# Patient Record
Sex: Female | Born: 1945 | Race: Black or African American | Hispanic: No | Marital: Single | Smoking: Former smoker
Health system: Southern US, Community
[De-identification: ages and names within clinical notes are randomized; demographics above are authoritative.]

## PROBLEM LIST (undated history)

## (undated) DIAGNOSIS — F32A Depression, unspecified: Secondary | ICD-10-CM

## (undated) DIAGNOSIS — I499 Cardiac arrhythmia, unspecified: Secondary | ICD-10-CM

## (undated) DIAGNOSIS — F329 Major depressive disorder, single episode, unspecified: Secondary | ICD-10-CM

## (undated) DIAGNOSIS — D759 Disease of blood and blood-forming organs, unspecified: Secondary | ICD-10-CM

## (undated) DIAGNOSIS — I509 Heart failure, unspecified: Secondary | ICD-10-CM

## (undated) DIAGNOSIS — K219 Gastro-esophageal reflux disease without esophagitis: Secondary | ICD-10-CM

## (undated) DIAGNOSIS — G8929 Other chronic pain: Secondary | ICD-10-CM

## (undated) DIAGNOSIS — R011 Cardiac murmur, unspecified: Secondary | ICD-10-CM

## (undated) DIAGNOSIS — M549 Dorsalgia, unspecified: Secondary | ICD-10-CM

## (undated) DIAGNOSIS — M109 Gout, unspecified: Secondary | ICD-10-CM

## (undated) DIAGNOSIS — I10 Essential (primary) hypertension: Secondary | ICD-10-CM

## (undated) DIAGNOSIS — B029 Zoster without complications: Secondary | ICD-10-CM

## (undated) DIAGNOSIS — N189 Chronic kidney disease, unspecified: Secondary | ICD-10-CM

## (undated) DIAGNOSIS — A159 Respiratory tuberculosis unspecified: Secondary | ICD-10-CM

## (undated) DIAGNOSIS — I2699 Other pulmonary embolism without acute cor pulmonale: Secondary | ICD-10-CM

## (undated) DIAGNOSIS — J189 Pneumonia, unspecified organism: Secondary | ICD-10-CM

## (undated) DIAGNOSIS — N183 Chronic kidney disease, stage 3 unspecified: Secondary | ICD-10-CM

## (undated) DIAGNOSIS — F419 Anxiety disorder, unspecified: Secondary | ICD-10-CM

## (undated) DIAGNOSIS — D649 Anemia, unspecified: Secondary | ICD-10-CM

## (undated) DIAGNOSIS — Z5189 Encounter for other specified aftercare: Secondary | ICD-10-CM

## (undated) DIAGNOSIS — M5430 Sciatica, unspecified side: Secondary | ICD-10-CM

## (undated) DIAGNOSIS — F99 Mental disorder, not otherwise specified: Secondary | ICD-10-CM

## (undated) DIAGNOSIS — R0602 Shortness of breath: Secondary | ICD-10-CM

## (undated) DIAGNOSIS — M1990 Unspecified osteoarthritis, unspecified site: Secondary | ICD-10-CM

## (undated) HISTORY — PX: LASER ABLATION: SHX1947

## (undated) HISTORY — PX: VASCULAR SURGERY: SHX849

## (undated) HISTORY — PX: DILATION AND CURETTAGE OF UTERUS: SHX78

## (undated) HISTORY — PX: TUBAL LIGATION: SHX77

## (undated) HISTORY — PX: JOINT REPLACEMENT: SHX530

## (undated) HISTORY — PX: SHOULDER SURGERY: SHX246

---

## 2006-10-11 ENCOUNTER — Emergency Department (HOSPITAL_COMMUNITY): Admission: EM | Admit: 2006-10-11 | Discharge: 2006-10-11 | Payer: Self-pay | Admitting: Emergency Medicine

## 2008-01-21 ENCOUNTER — Other Ambulatory Visit: Admission: RE | Admit: 2008-01-21 | Discharge: 2008-01-21 | Payer: Self-pay | Admitting: Internal Medicine

## 2008-01-21 ENCOUNTER — Encounter: Admission: RE | Admit: 2008-01-21 | Discharge: 2008-01-21 | Payer: Self-pay | Admitting: Internal Medicine

## 2008-01-21 ENCOUNTER — Inpatient Hospital Stay (HOSPITAL_COMMUNITY): Admission: EM | Admit: 2008-01-21 | Discharge: 2008-01-24 | Payer: Self-pay | Admitting: Internal Medicine

## 2008-01-22 ENCOUNTER — Ambulatory Visit: Payer: Self-pay | Admitting: *Deleted

## 2008-01-22 ENCOUNTER — Encounter (INDEPENDENT_AMBULATORY_CARE_PROVIDER_SITE_OTHER): Payer: Self-pay | Admitting: Internal Medicine

## 2008-06-03 ENCOUNTER — Ambulatory Visit (HOSPITAL_COMMUNITY): Admission: RE | Admit: 2008-06-03 | Discharge: 2008-06-03 | Payer: Self-pay | Admitting: Cardiology

## 2008-06-03 ENCOUNTER — Encounter (INDEPENDENT_AMBULATORY_CARE_PROVIDER_SITE_OTHER): Payer: Self-pay | Admitting: Cardiology

## 2008-07-20 ENCOUNTER — Inpatient Hospital Stay (HOSPITAL_COMMUNITY): Admission: AD | Admit: 2008-07-20 | Discharge: 2008-07-22 | Payer: Self-pay | Admitting: Cardiology

## 2008-07-27 ENCOUNTER — Inpatient Hospital Stay (HOSPITAL_COMMUNITY): Admission: AD | Admit: 2008-07-27 | Discharge: 2008-08-04 | Payer: Self-pay | Admitting: Cardiology

## 2008-07-27 ENCOUNTER — Ambulatory Visit: Payer: Self-pay | Admitting: Internal Medicine

## 2008-08-15 ENCOUNTER — Encounter (INDEPENDENT_AMBULATORY_CARE_PROVIDER_SITE_OTHER): Payer: Self-pay | Admitting: Emergency Medicine

## 2008-08-15 ENCOUNTER — Ambulatory Visit: Payer: Self-pay | Admitting: Vascular Surgery

## 2008-08-15 ENCOUNTER — Inpatient Hospital Stay (HOSPITAL_COMMUNITY): Admission: EM | Admit: 2008-08-15 | Discharge: 2008-08-17 | Payer: Self-pay | Admitting: Emergency Medicine

## 2008-09-29 ENCOUNTER — Emergency Department (HOSPITAL_COMMUNITY): Admission: EM | Admit: 2008-09-29 | Discharge: 2008-09-29 | Payer: Self-pay | Admitting: Emergency Medicine

## 2008-12-07 ENCOUNTER — Inpatient Hospital Stay (HOSPITAL_COMMUNITY): Admission: RE | Admit: 2008-12-07 | Discharge: 2008-12-10 | Payer: Self-pay | Admitting: Orthopedic Surgery

## 2009-03-15 ENCOUNTER — Emergency Department (HOSPITAL_COMMUNITY): Admission: EM | Admit: 2009-03-15 | Discharge: 2009-03-16 | Payer: Self-pay | Admitting: Emergency Medicine

## 2009-03-15 ENCOUNTER — Emergency Department (HOSPITAL_COMMUNITY): Admission: EM | Admit: 2009-03-15 | Discharge: 2009-03-15 | Payer: Self-pay | Admitting: Emergency Medicine

## 2009-04-05 ENCOUNTER — Ambulatory Visit (HOSPITAL_COMMUNITY): Admission: RE | Admit: 2009-04-05 | Discharge: 2009-04-05 | Payer: Self-pay | Admitting: Orthopedic Surgery

## 2009-05-04 ENCOUNTER — Inpatient Hospital Stay (HOSPITAL_COMMUNITY)
Admission: RE | Admit: 2009-05-04 | Discharge: 2009-05-08 | Payer: Self-pay | Source: Home / Self Care | Admitting: Orthopedic Surgery

## 2009-11-18 ENCOUNTER — Other Ambulatory Visit: Admission: RE | Admit: 2009-11-18 | Discharge: 2009-11-18 | Payer: Self-pay | Admitting: Obstetrics and Gynecology

## 2009-12-22 ENCOUNTER — Emergency Department (HOSPITAL_COMMUNITY): Admission: EM | Admit: 2009-12-22 | Discharge: 2009-12-22 | Payer: Self-pay | Admitting: Emergency Medicine

## 2010-01-26 ENCOUNTER — Emergency Department (HOSPITAL_COMMUNITY): Admission: EM | Admit: 2010-01-26 | Discharge: 2010-01-26 | Payer: Self-pay | Admitting: Emergency Medicine

## 2010-01-27 ENCOUNTER — Ambulatory Visit: Payer: Self-pay | Admitting: Vascular Surgery

## 2010-01-27 ENCOUNTER — Emergency Department (HOSPITAL_COMMUNITY): Admission: EM | Admit: 2010-01-27 | Discharge: 2010-01-27 | Payer: Self-pay | Admitting: Emergency Medicine

## 2010-01-28 ENCOUNTER — Inpatient Hospital Stay (HOSPITAL_COMMUNITY): Admission: EM | Admit: 2010-01-28 | Discharge: 2010-02-04 | Payer: Self-pay | Admitting: Emergency Medicine

## 2010-02-02 ENCOUNTER — Ambulatory Visit: Payer: Self-pay | Admitting: Infectious Disease

## 2010-03-07 ENCOUNTER — Telehealth: Payer: Self-pay | Admitting: Infectious Disease

## 2010-03-07 ENCOUNTER — Ambulatory Visit: Payer: Self-pay | Admitting: Infectious Disease

## 2010-03-07 ENCOUNTER — Encounter: Payer: Self-pay | Admitting: Infectious Disease

## 2010-03-07 ENCOUNTER — Telehealth (INDEPENDENT_AMBULATORY_CARE_PROVIDER_SITE_OTHER): Payer: Self-pay | Admitting: Licensed Clinical Social Worker

## 2010-03-07 DIAGNOSIS — A4101 Sepsis due to Methicillin susceptible Staphylococcus aureus: Secondary | ICD-10-CM

## 2010-03-07 DIAGNOSIS — M009 Pyogenic arthritis, unspecified: Secondary | ICD-10-CM | POA: Insufficient documentation

## 2010-03-07 DIAGNOSIS — B028 Zoster with other complications: Secondary | ICD-10-CM | POA: Insufficient documentation

## 2010-03-07 LAB — CONVERTED CEMR LAB
BUN: 34 mg/dL — ABNORMAL HIGH (ref 6–23)
Basophils Absolute: 0.1 10*3/uL (ref 0.0–0.1)
Basophils Relative: 1 % (ref 0–1)
CO2: 24 meq/L (ref 19–32)
Calcium: 9.1 mg/dL (ref 8.4–10.5)
Chloride: 98 meq/L (ref 96–112)
Creatinine, Ser: 1.46 mg/dL — ABNORMAL HIGH (ref 0.40–1.20)
Glucose, Bld: 194 mg/dL — ABNORMAL HIGH (ref 70–99)
Lymphocytes Relative: 37 % (ref 12–46)
MCHC: 31 g/dL (ref 30.0–36.0)
Neutro Abs: 3.5 10*3/uL (ref 1.7–7.7)
Neutrophils Relative %: 52 % (ref 43–77)
Platelets: 577 10*3/uL — ABNORMAL HIGH (ref 150–400)
RDW: 16.1 % — ABNORMAL HIGH (ref 11.5–15.5)
Sed Rate: 119 mm/hr — ABNORMAL HIGH (ref 0–22)

## 2010-03-09 ENCOUNTER — Encounter: Payer: Self-pay | Admitting: Infectious Disease

## 2010-03-14 ENCOUNTER — Encounter: Payer: Self-pay | Admitting: Infectious Disease

## 2010-03-15 ENCOUNTER — Encounter: Payer: Self-pay | Admitting: Infectious Disease

## 2010-03-17 ENCOUNTER — Encounter: Admission: RE | Admit: 2010-03-17 | Payer: Self-pay | Source: Home / Self Care | Admitting: Infectious Disease

## 2010-03-19 ENCOUNTER — Inpatient Hospital Stay (HOSPITAL_COMMUNITY)
Admission: EM | Admit: 2010-03-19 | Discharge: 2010-03-20 | Payer: Self-pay | Source: Home / Self Care | Attending: Internal Medicine | Admitting: Internal Medicine

## 2010-03-19 ENCOUNTER — Encounter
Admission: RE | Admit: 2010-03-19 | Discharge: 2010-03-19 | Payer: Self-pay | Source: Home / Self Care | Attending: Infectious Disease | Admitting: Infectious Disease

## 2010-03-19 ENCOUNTER — Encounter: Payer: Self-pay | Admitting: Internal Medicine

## 2010-03-19 DIAGNOSIS — R42 Dizziness and giddiness: Secondary | ICD-10-CM | POA: Insufficient documentation

## 2010-03-19 DIAGNOSIS — M869 Osteomyelitis, unspecified: Secondary | ICD-10-CM | POA: Insufficient documentation

## 2010-03-19 DIAGNOSIS — I2699 Other pulmonary embolism without acute cor pulmonale: Secondary | ICD-10-CM

## 2010-03-19 DIAGNOSIS — D649 Anemia, unspecified: Secondary | ICD-10-CM | POA: Insufficient documentation

## 2010-03-19 DIAGNOSIS — E119 Type 2 diabetes mellitus without complications: Secondary | ICD-10-CM

## 2010-03-19 DIAGNOSIS — E785 Hyperlipidemia, unspecified: Secondary | ICD-10-CM

## 2010-03-19 DIAGNOSIS — I4892 Unspecified atrial flutter: Secondary | ICD-10-CM

## 2010-03-19 DIAGNOSIS — N189 Chronic kidney disease, unspecified: Secondary | ICD-10-CM

## 2010-03-19 DIAGNOSIS — M1990 Unspecified osteoarthritis, unspecified site: Secondary | ICD-10-CM | POA: Insufficient documentation

## 2010-03-19 DIAGNOSIS — I10 Essential (primary) hypertension: Secondary | ICD-10-CM | POA: Insufficient documentation

## 2010-03-19 DIAGNOSIS — F329 Major depressive disorder, single episode, unspecified: Secondary | ICD-10-CM

## 2010-03-19 DIAGNOSIS — E1149 Type 2 diabetes mellitus with other diabetic neurological complication: Secondary | ICD-10-CM

## 2010-03-19 DIAGNOSIS — K219 Gastro-esophageal reflux disease without esophagitis: Secondary | ICD-10-CM | POA: Insufficient documentation

## 2010-03-19 HISTORY — DX: Chronic kidney disease, unspecified: N18.9

## 2010-03-30 ENCOUNTER — Encounter: Payer: Self-pay | Admitting: Infectious Disease

## 2010-04-01 ENCOUNTER — Telehealth: Payer: Self-pay | Admitting: Infectious Disease

## 2010-04-17 ENCOUNTER — Encounter: Payer: Self-pay | Admitting: Internal Medicine

## 2010-04-17 ENCOUNTER — Encounter: Payer: Self-pay | Admitting: Infectious Disease

## 2010-04-18 ENCOUNTER — Encounter: Payer: Self-pay | Admitting: Infectious Disease

## 2010-04-18 ENCOUNTER — Encounter: Payer: Self-pay | Admitting: Gastroenterology

## 2010-04-18 ENCOUNTER — Ambulatory Visit
Admission: RE | Admit: 2010-04-18 | Discharge: 2010-04-18 | Payer: Self-pay | Source: Home / Self Care | Attending: Infectious Disease | Admitting: Infectious Disease

## 2010-04-18 DIAGNOSIS — B029 Zoster without complications: Secondary | ICD-10-CM | POA: Insufficient documentation

## 2010-04-18 LAB — CONVERTED CEMR LAB
AST: 17 units/L (ref 0–37)
Albumin: 3.8 g/dL (ref 3.5–5.2)
Alkaline Phosphatase: 86 units/L (ref 39–117)
Basophils Absolute: 0 10*3/uL (ref 0.0–0.1)
Basophils Relative: 1 % (ref 0–1)
Eosinophils Absolute: 0.1 10*3/uL (ref 0.0–0.7)
Glucose, Bld: 288 mg/dL — ABNORMAL HIGH (ref 70–99)
Hemoglobin: 15.6 g/dL — ABNORMAL HIGH (ref 12.0–15.0)
MCHC: 32.2 g/dL (ref 30.0–36.0)
MCV: 92 fL (ref 78.0–100.0)
Monocytes Absolute: 0.2 10*3/uL (ref 0.1–1.0)
Monocytes Relative: 6 % (ref 3–12)
Neutro Abs: 1.9 10*3/uL (ref 1.7–7.7)
Neutrophils Relative %: 56 % (ref 43–77)
Potassium: 3.7 meq/L (ref 3.5–5.3)
RBC: 5.27 M/uL — ABNORMAL HIGH (ref 3.87–5.11)
RDW: 15.7 % — ABNORMAL HIGH (ref 11.5–15.5)
Sodium: 139 meq/L (ref 135–145)
Total Bilirubin: 0.4 mg/dL (ref 0.3–1.2)
Total Protein: 7.3 g/dL (ref 6.0–8.3)

## 2010-04-20 ENCOUNTER — Ambulatory Visit: Admit: 2010-04-20 | Payer: Self-pay | Admitting: Infectious Disease

## 2010-04-20 ENCOUNTER — Encounter: Payer: Self-pay | Admitting: Infectious Disease

## 2010-04-21 ENCOUNTER — Encounter: Payer: Self-pay | Admitting: Infectious Disease

## 2010-04-25 ENCOUNTER — Encounter: Payer: Self-pay | Admitting: Infectious Disease

## 2010-04-25 ENCOUNTER — Telehealth: Payer: Self-pay | Admitting: Infectious Disease

## 2010-04-27 ENCOUNTER — Telehealth: Payer: Self-pay | Admitting: Infectious Disease

## 2010-04-28 NOTE — Miscellaneous (Signed)
Summary: HIPAA Restrictions  HIPAA Restrictions   Imported By: Florinda Marker 03/09/2010 08:59:25  _____________________________________________________________________  External Attachment:    Type:   Image     Comment:   External Document

## 2010-04-28 NOTE — Miscellaneous (Signed)
Summary: [facility]  Clinical Lists Changes  Medications: Added new medication of LORAZEPAM 1 MG TABS (LORAZEPAM) take one half table to one whole tablet 1 hour before procedure, may repeat before procedure - Signed Rx of LORAZEPAM 1 MG TABS (LORAZEPAM) take one half table to one whole tablet 1 hour before procedure, may repeat before procedure;  #3 x 0;  Signed;  Entered by: Acey Lav MD;  Authorized by: Paulette Blanch Dam MD;  Method used: Printed then faxed to [facility]. # [id number]*, [address] [place], [place]  [address], Ph: [phone], Fax: [phone]    Prescriptions: LORAZEPAM 1 MG TABS (LORAZEPAM) take one half table to one whole tablet 1 hour before procedure, may repeat before procedure  #3 x 0   Entered and Authorized by:   Acey Lav MD   Signed by:   Paulette Blanch Dam MD on 03/15/2010   Method used:   Printed then faxed to ...       [facility] Dr[address] # [phone]* (retail)       [address]       [place], [place]  [address]       Ph: [phone]       Fax: [phone]   RxID:   [id number]

## 2010-04-28 NOTE — Discharge Summary (Signed)
Summary: Hospital Discharge Update    Hospital Discharge Update:  Date of Admission: 03/19/2010 Date of Discharge: 03/20/2010  Brief Summary:  Pt is a 65 y/o woman admitted for osteomyelitis of right shoulder. Underwent I+D on 12/24 by Dr. Ophelia Charter of Orthopedics.  To follow-up with Dr. Ophelia Charter on Dec 27 to pull her drain, her coumadin clinic in the next 4-5 days, PCP Dr. Nehemiah Settle in 1-2 weeks, and Dr. Daiva Eves in 4-5 weeks.  Discharged home on IV Ancef q8h x6 weeks.  Lab or other results pending at discharge:  wound cultures  Labs needed at follow-up: CBC with differential, PT/INR  Problem list changes:  Added new problem of DIABETES MELLITUS, TYPE II, ON INSULIN (ICD-250.00) Added new problem of INTERMITTENT VERTIGO (ICD-780.4) Added new problem of ATRIAL FLUTTER, CHRONIC (ICD-427.32) Added new problem of HYPERTENSION (ICD-401.9) Added new problem of MORBID OBESITY (ICD-278.01) Added new problem of GERD (ICD-530.81) Added new problem of DEPRESSION (ICD-311) Added new problem of HYPERLIPIDEMIA (ICD-272.4) Added new problem of OSTEOARTHRITIS (ICD-715.90) Added new problem of RENAL INSUFFICIENCY, CHRONIC (ICD-585.9) Added new problem of History of  PULMONARY EMBOLISM (ICD-415.19) Added new problem of DIABETIC PERIPHERAL NEUROPATHY (ICD-250.60) Added new problem of ANEMIA (ICD-285.9) Added new problem of Hospitalized for  OSTEOMYELITIS (ICD-730.20)  Medication list changes:  Changed medication from FUROSEMIDE 80 MG TABS (FUROSEMIDE) take 1 tablet by mouth once daily to FUROSEMIDE 80 MG TABS (FUROSEMIDE) Take 1 tablet by mouth two times a day Removed medication of FUROSEMIDE 80 MG TABS (FUROSEMIDE) take 1 tablet by mouth twice daily Changed medication from DILTIAZEM HCL 120 MG TABS (DILTIAZEM HCL) 1 tablet daily to DILTIAZEM HCL ER BEADS 120 MG XR24H-CAP (DILTIAZEM HCL ER BEADS) Take 1 tablet by mouth once a day Changed medication from METOPROLOL SUCCINATE 100 MG XR24H-TAB (METOPROLOL  SUCCINATE) 1 once daily to METOPROLOL SUCCINATE 100 MG XR24H-TAB (METOPROLOL SUCCINATE) 1/2 tab by mouth daily Removed medication of LORAZEPAM 0.5 MG TABS (LORAZEPAM) 1 to 2 tablets as needed Removed medication of HIBICLENS 4 % LIQD (CHLORHEXIDINE GLUCONATE) wash once daily for 7 days after picc line out and one week before surgery Removed medication of BACTROBAN 2 % OINT (MUPIROCIN) apply twice dialy inside nostrils for 7 days after picc out and before surgery Removed medication of VALACYCLOVIR HCL 1 GM TABS (VALACYCLOVIR HCL) Take 1 tablet by mouth two times a day for 10 days Removed medication of ACYCLOVIR 400 MG TABS (ACYCLOVIR) Take 1 tablet by mouth two times a day once done with valtrex Removed medication of LORAZEPAM 1 MG TABS (LORAZEPAM) take one half table to one whole tablet 1 hour before procedure, may repeat before procedure Added new medication of LANTUS 100 UNIT/ML SOLN (INSULIN GLARGINE) Inject 44u Subcutaneously two times a day Added new medication of OXYCODONE HCL 5 MG TABS (OXYCODONE HCL) Take 1-2 tabs by mouth every 4hours as needed for pain - Signed Added new medication of CEFAZOLIN SODIUM 1 GM SOLR (CEFAZOLIN SODIUM) Infuse 1g IV q8h for 6 weeks Rx of OXYCODONE HCL 5 MG TABS (OXYCODONE HCL) Take 1-2 tabs by mouth every 4hours as needed for pain;  #60 x 0;  Signed;  Entered by: Danelle Berry, MD;  Authorized by: Danelle Berry, MD;  Method used: Handwritten  Allergy list changes:  Added new allergy or adverse reaction of * SHELLFISH  The medication, problem, and allergy lists have been updated.  Please see the dictated discharge summary for details.  Discharge medications:  WARFARIN SODIUM 5 MG TABS (WARFARIN SODIUM) take  as directed FUROSEMIDE 80 MG TABS (FUROSEMIDE) Take 1 tablet by mouth two times a day MECLIZINE HCL 25 MG TABS (MECLIZINE HCL) 1 tablet three times daily SIMVASTATIN 40 MG TABS (SIMVASTATIN) 1 every evening POTASSIUM CHLORIDE 10 MEQ/100ML SOLN  (POTASSIUM CHLORIDE) take 2 tab by mouth daily PROMETHAZINE HCL 25 MG TABS (PROMETHAZINE HCL) take one every 4 to 6 hours as needed SERTRALINE HCL 50 MG TABS (SERTRALINE HCL) take 1 tablet by mouth every day DILTIAZEM HCL ER BEADS 120 MG XR24H-CAP (DILTIAZEM HCL ER BEADS) Take 1 tablet by mouth once a day METOPROLOL SUCCINATE 100 MG XR24H-TAB (METOPROLOL SUCCINATE) 1/2 tab by mouth daily GABAPENTIN 600 MG TABS (GABAPENTIN) take 1 tablet by mouth three times daily LANTUS 100 UNIT/ML SOLN (INSULIN GLARGINE) Inject 44u Subcutaneously two times a day OXYCODONE HCL 5 MG TABS (OXYCODONE HCL) Take 1-2 tabs by mouth every 4hours as needed for pain CEFAZOLIN SODIUM 1 GM SOLR (CEFAZOLIN SODIUM) Infuse 1g IV q8h for 6 weeks  Other patient instructions:  Please follow-up with Dr. Ophelia Charter on Tuesday, Dec 27. Please call his clinic on Monday to schedule an appointment ([phone]). Please call your primary care physician Dr. Nehemiah Settle [phone]) to follow-up in the next 1-2 weeks. Please follow-up with Dr. Daiva Eves of Infectious Disease in 4-5 weeks.  Please call the clinic at [phone] on Monday to schedule an appointment.  Take your medicines as directed. Take 7.5mg  coumadin tomorrow, then go back to 5mg  daily until you have your INR checked.  Please call your coumadin clinic/PCP to have your INR checked sometime this week. You will be on IV antibiotics for 6 weeks. Take 30 units of Lantus tonight. Check your blood sugars tomorrow and if they are running higher than usual, you may add 2 units tomorrow (Monday) night (32 units).  Continue going up on your lantus by 2 units nightly until you reach your previous home dose of 44 units if your sugars still run higher than usual (this will take several days).  Note: Hospital Discharge Medications & Other Instructions handout was printed, one copy for patient and a second copy to be placed in hospital chart.

## 2010-04-28 NOTE — Progress Notes (Signed)
Summary: Care Plan Oversight  Phone Note Outgoing Call   Call placed by: Acey Lav MD,  April 01, 2010 8:22 AM Details for Reason: Care Plan Oversight Summary of Call: [phone] (30 or more mins)  I have supervised home care and/or infusion therapy for this pt, including providing orders for care, review of labs and/or home health care plans, communicating with the home health care professionals and/or patient/caregivers to integrate current information into the medical treatment plan and/or adjust the medical therapy. This supervision has been provided for 32__minutes during the calendar month. Dates for this oversight 04/05/10 thru 2/9/12_.  Treating for infected shoulder with MSSA  Initial call taken by: Acey Lav MD,  April 01, 2010 8:23 AM

## 2010-04-28 NOTE — Progress Notes (Signed)
Summary: lab results need to be faxed  Phone Note Other Incoming   Caller: Verlon Au from [place] Summary of Call: Please fax lab results to [facility] at [phone] labs are faxed Initial call taken by: Starleen Arms CMA,  March 14, 2010 8:55 AM

## 2010-04-28 NOTE — Assessment & Plan Note (Signed)
Summary: f/u infection   Visit Type:  Follow-up Referring Provider:  Annell Greening Primary Provider:  Renford Dills  CC:  f/u.  History of Present Illness:  65 yo with complicated PMHX who was found to have MSSA septic shoulder in November , sp washout by Dr. Ophelia Charter. We placed her on ancef and she has continued on this since then. In interim she was seen  and found to have an elevated ESR and repeat MRI showed area concerning for residual abscess along with osteomyelitis of the humerus. She underwent repeat surgery by Dr. Ophelia Charter who encountered fluid along with a torn biceps tendone but no frank purulence and she was continued on ancef (though dc note mentions 1g three times a day rather than 2 g three times a day). She was seen by Dr Ophelia Charter and still had elevated ESR. I had thought that she might have RA--and that this could have caused her elevated ESR but in fact trhough more careful review of records she does not have history documented in [place] or EMR of Rheuamtoid arthritis. She states at present that her shoulder pain is much improved though she still has pain at times up to 5/10 in severity. She also stil has dermatomal zoster pain.   Current Allergies (reviewed today): ! * SHELLFISH Past History:  Past Medical History: Last updated: 03/07/2010   1. Atrial fibrillation/atrial flutter with failed ablation.   2. DM.   3. Hypertension.   4. 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   5. Right lower extremity DVT.   6. Obesity.   7. Anemia.   8. Diabetic neuropathy.   9. GERD.   10.Anxiety/depression.   11.CHF with an EF 55%.   12.Hyperlipidemia.  13 Recurrent Zoster 14. Problems with breast enlargement of unknown cause (negative mammogram)     Family History: Last updated: 03/07/2010 :  Noncontributory.   Social History: Last updated: 03/07/2010  Denies tobacco abuse, alcohol abuse, or drug abuse.   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   Family History: Reviewed history from  03/07/2010 and no changes required. :  Noncontributory.   Social History: Reviewed history from 03/07/2010 and no changes required.  Denies tobacco abuse, alcohol abuse, or drug abuse.   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   Review of Systems       see HPI otherwise negative on 12 pt review  Vital Signs:  Patient profile:   65 year old female Height:      63.5 inches (161.29 cm) Weight:      238 pounds (108.18 kg) BMI:     41.65 Temp:     98.6 degrees F (37.00 degrees C) oral Pulse rate:   85 / minute BP sitting:   129 / 79  (left arm)  Vitals Entered By: Starleen Arms CMA (April 18, 2010 10:31 AM) CC: f/u Is Patient Diabetic? Yes Did you bring your meter with you today? No Pain Assessment Patient in pain? yes     Location: rt shoulder Intensity: 7 Type: aching Nutritional Status BMI of > 30 = obese  Does patient need assistance? Ambulation Impaired:Risk for fall Comments walks with a cane   Physical Exam  General:  alert, well-nourished, well-hydrated, and overweight-appearing.   Eyes:  vision grossly intact, pupils equal, and pupils round.   Ears:   ear piercing(s) noted.  vesicle in the canal Mouth:  no dental plaque, pharynx pink and moist, and no erythema.   Abdomen:  soft, non-tender, and normal bowel  sounds.   Msk:  reduced rom in the right shoulder with reduced elevaation and abduciton of the shoulder. Surgical scar CDI Extremities:  1+ left pedal edema and 1+ right pedal edema.   Neurologic:  alert & oriented X3, strength normal in all extremities, and gait normal.   Skin:  healed rash in  the V3 dermatome Psych:  Oriented X3, memory intact for recent and remote, and normally interactive.     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 If it is still up will need repeat MRI though I also wonder if she has another site of metastatic MSSA  infection. Will probe her with questions and if this is unrevealing might consier tagged white blood cell scan in additon to MRI Her updated medication list for this problem includes:    Oxycodone Hcl 5 Mg Tabs (Oxycodone hcl) .Marland Kitchen... Take 1-2 tabs by mouth every 4hours as needed for pain    Cefazolin Sodium 1 Gm Solr (Cefazolin sodium) ..... Infuse 1g iv q8h for 6 weeks  Orders: Est. 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 Patient Level IV (02725)  Problem # 4:  OSTEOARTHRITIS (ICD-715.90) I had thought the pt had hx of RA but see no documentatio of this. Will check RF and also ask Dr. Nehemiah Settle for more records Her updated medication list for this problem includes:    Oxycodone Hcl 5 Mg Tabs (Oxycodone hcl) .Marland Kitchen... Take 1-2 tabs by mouth every 4hours as needed for pain  Orders: T-Rheumatoid Factor [facility]) [phone]) Est. Patient Level IV [id number])  Other Orders: T-C-Reactive Protein [phone]) T-CBC w/Diff [id number]) T-Comprehensive Metabolic Panel [id number]) T-Sed Rate (Automated) [phone])  Patient Instructions: 1)  we will get bloodwork today 2)  I will contact Dr. Ophelia Charter re bloodwork 3)  if you sed rate is still high you will need another open mri with [facility] 4)  We will make fu appt plans after gathering that data 5)  continue iv antibiotics in the meantime Prescriptions: OXYCODONE HCL 5 MG TABS (OXYCODONE HCL) Take 1-2 tabs by mouth every 4hours as needed for pain  #60 x 0   Entered and Authorized by:   Acey Lav MD   Signed by:   Paulette Blanch Dam MD on 04/18/2010   Method used:   Print then Give to Patient   RxID:   [id number]

## 2010-04-28 NOTE — Progress Notes (Addendum)
Summary: Care Plan Oversight  Phone Note Outgoing Call   Call placed by: Acey Lav MD,  March 07, 2010 5:39 PM Details for Reason: Care Plan Oversight Summary of Call: [phone] (30 or more mins)  I have supervised home care and/or infusion therapy for this pt, including providing orders for care, review of labs and/or home health care plans, communicating with the home health care professionals and/or patient/caregivers to integrate current information into the medical treatment plan and/or adjust the medical therapy. This supervision has been provided for _32__minutes during the calendar month. Dates for this oversight 02/03/10 thru 03/04/10.  Treating MSSA septic arthritis   Initial call taken by: Acey Lav MD,  March 07, 2010 5:40 PM

## 2010-04-28 NOTE — Assessment & Plan Note (Signed)
Summary: hsfu need chart/mssa septic sholder/   Visit Type:  Follow-up Referring Provider:  Annell Greening Primary Provider:  Renford Dills  CC:  hsfu and septic shoulder.  History of Present Illness:  65 yo with complicated PMHX who was found to have MSSA septic shoulder in November , sp washout by Dr. Ophelia Charter. We placed her on ancef and she has continued on this since then. She has marked improvement in her shoulder pain though she still does have some pain there and apparently is in need of improved pain control (and will be calling Dr. Nehemiah Settle re this). She denies fevers, chills, nausea. She has had yet another outbreak of apparent zoster with painful sensation in ear on the right and V3 dermatomal rash with vesicles within the past 3 days She noticed furuncles under right axilla prior to development of septic shoulder. I spent over 45 minutes with this pt including greater than 50% of time in face to face counsellng of the pt and coordination of care.      Current Allergies (reviewed today): No known allergies  Past History:  Past Medical History:   1. Atrial fibrillation/atrial flutter with failed ablation.   2. DM.   3. Hypertension.   4. 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   5. Right lower extremity DVT.   6. Obesity.   7. Anemia.   8. Diabetic neuropathy.   9. GERD.   10.Anxiety/depression.   11.CHF with an EF 55%.   12.Hyperlipidemia.  13 Recurrent Zoster 14. Problems with breast enlargement of unknown cause (negative mammogram)     Family History: :  Noncontributory.   Social History:  Denies tobacco abuse, alcohol abuse, or drug abuse.   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   Review of Systems       The patient complains of suspicious skin lesions and breast masses.  The patient denies anorexia, fever, weight loss, weight gain, vision loss, decreased hearing, hoarseness, chest pain, syncope, dyspnea on exertion, peripheral edema, prolonged cough, headaches,  hemoptysis, abdominal pain, melena, hematochezia, severe indigestion/heartburn, hematuria, incontinence, genital sores, muscle weakness, transient blindness, difficulty walking, depression, unusual weight change, abnormal bleeding, and enlarged lymph nodes.    Vital Signs:  Patient profile:   65 year old female Height:      63.5 inches (161.29 cm) Weight:      238 pounds (108.18 kg) BMI:     41.65 Temp:     98.2 degrees F (36.78 degrees C) oral Pulse rate:   83 / minute BP sitting:   141 / 103  (left arm)  Vitals Entered By: Starleen Arms CMA (March 07, 2010 9:09 AM) CC: hsfu, septic shoulder Is Patient Diabetic? Yes Did you bring your meter with you today? No Pain Assessment Patient in pain? yes     Location: shoulder Intensity: 7 Type: aching Nutritional Status BMI of > 30 = obese Nutritional Status Detail nl  Does patient need assistance? Functional Status Self care Ambulation Normal   Physical Exam  General:  alert, well-nourished, well-hydrated, and overweight-appearing.   Head:  normocephalic, atraumatic, and no abnormalities observed.   Eyes:  vision grossly intact, pupils equal, and pupils round.   Ears:   ear piercing(s) noted.  vesicle in the canal Nose:  no external deformity and no external erythema.   Mouth:  no dental plaque, pharynx pink and moist, and no erythema.   Neck:  supple and full ROM.   Lungs:  normal respiratory effort, no crackles, and no  wheezes.   Heart:  normal rate, regular rhythm, no murmur, no gallop, and no rub.   Abdomen:  soft, non-tender, and normal bowel sounds.   Msk:  reduced rom in the right shoulder with reduced elevaation and abduciton of the shoulder Extremities:  1+ left pedal edema and 1+ right pedal edema.   Neurologic:  alert & oriented X3, strength normal in all extremities, and gait normal.   Skin:  vesicular rashin the V3 dermatome Psych:  Oriented X3, memory intact for recent and remote, and normally  interactive.     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 Patient Level V (83151)  Problem # 3:  OTH PLASTIC SURGERY UNACCEPTABLE COSMETIC APPEAR (ICD-V50.1)  recommend postponing surgery till infection cleared. WIll try decontamintion regimen and would repeat prior to surgeyr to reduce risk of MSSA (and MRSA infection  Orders: Est. Patient Level V (76160)  Problem # 4:  OTITIS EXTERNA DUE TO HERPES ZOSTER (ICD-053.71)  give her valtrex for 10 days and then suppressive acyclovir. Her HIV elisa was negative  Orders: Est. Patient Level V (73710)  Medications Added to Medication List This Visit: 1)  Warfarin Sodium 5 Mg Tabs (Warfarin sodium) .... Take as directed 2)  Furosemide 80 Mg Tabs (Furosemide) .... Take 1 tablet by mouth once daily 3)  Meclizine Hcl 25 Mg Tabs (Meclizine hcl) .Marland Kitchen.. 1 tablet three times daily 4)  Simvastatin 40 Mg Tabs (Simvastatin) .Marland Kitchen.. 1 every evening 5)  Potassium Chloride 10 Meq/111ml Soln (Potassium chloride) .... Take 2 tab by mouth daily 6)  Furosemide 80 Mg Tabs (Furosemide) .... Take 1 tablet by mouth twice daily 7)  Promethazine Hcl 25 Mg Tabs (Promethazine hcl) .... Take one every 4 to 6 hours as needed 8)  Sertraline Hcl 50 Mg Tabs (Sertraline hcl) .... Take 1 tablet by mouth every day 9)  Diltiazem Hcl 120 Mg Tabs (Diltiazem hcl) .Marland Kitchen.. 1 tablet daily 10)  Metoprolol Succinate 100 Mg Xr24h-tab (Metoprolol succinate) .Marland Kitchen.. 1 once daily 11)  Gabapentin 600  Mg Tabs (Gabapentin) .... Take 1 tablet by mouth three times daily 12)  Lorazepam 0.5 Mg Tabs (Lorazepam) .Marland Kitchen.. 1 to 2 tablets as needed 13)  Hibiclens 4 % Liqd (Chlorhexidine gluconate) .... Wash once daily for 7 days after picc line out and one week before surgery 14)  Bactroban 2 % Oint (Mupirocin) .... Apply twice dialy inside nostrils for 7 days after picc out and before surgery 15)  Valacyclovir Hcl 1 Gm Tabs (Valacyclovir hcl) .... Take 1 tablet by mouth two times a day for 10 days 16)  Acyclovir 400 Mg Tabs (Acyclovir) .... Take 1 tablet by mouth two times a day once done with valtrex  Patient Instructions: 1)  we will have one of our rns examine your picc line 2)  we will get blood work today 3)  if this looks encouraging will finish antibiotic sin a week and pull picc 4)  then do decontamination regimen with hibiclenz and mupirocin for a week 5)  repeat this week before any surgery 6)  start valtrex today  and go for 10 days 7)  then start acyclovir 8)  make sure you drink plenty of fluids while on thes medicines 9)  rtc with Dr. Daiva Eves in 3 months Prescriptions: ACYCLOVIR 400 MG TABS (ACYCLOVIR) Take 1 tablet by mouth two times a day once done with valtrex  #60 x 11   Entered and Authorized by:   Acey Lav MD   Signed by:   Paulette Blanch Dam MD on 03/07/2010   Method used:   Electronically to        [facility] [address] # [phone]* (retail)       [address]       [place], [place]  [address]       Ph: [phone]       Fax: [phone]   RxID:   [id number] VALACYCLOVIR HCL 1 GM TABS (VALACYCLOVIR HCL) Take 1 tablet by mouth two times a day for 10 days  #20 x 3   Entered and Authorized by:   Acey Lav MD   Signed by:   Paulette Blanch Dam MD on 03/07/2010   Method used:   Electronically to        [facility] Dr[address] # [phone]* (retail)       [address]       [place], [place]  [address]       Ph: [phone]       Fax: [phone]   RxID:    [id number] BACTROBAN 2 % OINT (MUPIROCIN) apply twice dialy inside nostrils for 7 days after picc out and before surgery  #30 x 2   Entered and Authorized by:   Acey Lav MD   Signed by:   Paulette Blanch Dam MD on 03/07/2010   Method used:   Electronically to        [facility] Dr[address] # [phone]* (retail)       [address]       [place], [place]  [address]       Ph: [phone]       Fax: [phone]   RxID:   [id number] HIBICLENS 4 % LIQD (CHLORHEXIDINE GLUCONATE) wash once daily for 7 days after picc line out and one week before surgery  #1 x 2   Entered and Authorized by:   Acey Lav MD   Signed by:   Paulette Blanch Dam MD on 03/07/2010   Method used:   Electronically to        [facility] Dr[address] # [phone]* (retail)       [address]       [place], [place]  [address]       Ph: [phone]       Fax: [phone]   RxID:   [id number]  Order recieved to blood draw and PIC line dressing change from Dr. Daiva Eves.  Completed blood drase and sterile dressing change.  Pt. without complaint.  Jennet Maduro RN  March 07, 2010 10:51 AM

## 2010-04-28 NOTE — Miscellaneous (Signed)
Summary: Orders Update  Clinical Lists Changes  Orders: Added new Test order of CT with Contrast (CT w/ contrast) - Signed 

## 2010-04-28 NOTE — Initial Assessments (Signed)
INTERNAL MEDICINE ADMISSION HISTORY AND PHYSICAL  PCP: Deirdre Peer. Polite, M.D. of [place]. ID - Dr. 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 Ophelia Charter (orthopedics) and on chronic Ancef and followed by Dr. Daiva Eves who presents after having MRI of the right shoulder today which showed osteomyelitis.  Pt complains of occasional sharp stabbing pain in her right shoulder for several weeks and severe tenderness to palpation of the shoulder.  She states that she has been increasingly weak from lack of use on that side and for several weeks has been virtually unable to use that arm because of the pain.    Patient also complains of vaginal itching and dysuria for the past several days; she states she has had several yeast infections since being on Ancef.  Denies any urinary urgency and frequency. Denies any constipation/diarrhea. No fevers, weight loss, CP, SOB, cough, abdominal pain. No headaches. Endorses occasional dizziness which is chronic.  No numbness or tingling.   ALLERGIES: Shellfish - emesis, diarrhea and sharp abdominal pain  PAST MEDICAL HISTORY: h/o right shoulder septic arthritis with MSSA, diagnosed in Nov 2011. s/p washout Jan 30, 2010 by Dr. Ophelia Charter, s/p 5 weeks IV Ancef Atrial fibrillation/atrial flutter status post EP study with paced termination of atrial flutter with failed ablation.  Chronic renal insufficiency - baseline Cr 1.6 DM.  Hypertension.  h/o PE x2, h/o DVT - on chronic Coumadin.  h/o chronic pain, noncardiac dependent, for prolonged period of time Obesity.  Anemia.  Diabetic neuropathy.  GERD.  Anxiety/depression.  CHF with an EF 55%.  Hyperlipidemia.  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 Problems with breast enlargement of unknown cause (negative mammogram) vertigo    MEDICATIONS:    lantus 44u Subcutaneously two times a day coumadin as directed lasix 80mg  by mouth two times a day meclizine 25mg  by mouth three times a day as needed for dizziness/vertigo metoprolol ER 50mg  by mouth daily KCl by mouth daily neurontin 600mg  by mouth three times a day promethazine 25mg  by mouth three times a day as needed nausea diltiazem ER 120mg  by mouth daily sertraline 50mg  by mouth daily simvastatin 40mg  by mouth daily   SOCIAL HISTORY: Denies tobacco abuse, alcohol abuse, or drug abuse. 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    FAMILY HISTORY Noncontributory.    ROS: As per HPI  VITALS: T: 98 P:95  BP: 111/80 R: 16  O2SAT:96%  ON: RA  PHYSICAL EXAM: Gen: Patient is an obese woman in NAD Eyes: PERRL, EOMI ENT: MMM Neck: Supple Resp: CTA- Bilaterally, No W/C/R. CVS: RRR, No M/R/G GI: Abdomen is soft. ND, NT, BS+.   Ext: trace pedal edema, 2+ DP pulses MSK:  limited ROM of right shoulder - pt unable to internally or externally rotate shoulder. Right shoulder tender to palpation laterally and tender to both passive and active movement.  Joint not significantly warmer on right as left. No erythema. Skin intact. Neuro: A&O X3, CN II - XII are grossly intact. Motor strength is 5-/5 in the all 4 extremities, Sensations intact to light touch Psych: Appropriate   LABS: All labs are pending  IMAGING: MRI RIGHT SHOULDER WITH AND WITHOUT CONTRAST    Technique:  Multiplanar, multisequence MR imaging of the right   shoulder was performed before and after the administration of  intravenous contrast.    Contrast: 20 ml Multihance    Comparison:  CT scan 01/27/2010    Findings: There is severe septic arthritis involving the   glenohumeral joint along with osteomyelitis involving the humeral   head and glenoid.  There is also involvement of the rotator cuff   tendons and subacromial/subdeltoid bursa.  There is a large 4 x 3   cm bursal abscess.  There is also  involvement of the rotator cuff   muscles and deltoid muscles.    IMPRESSION:    1.  MR findings consistent with septic arthritis, osteomyelitis,   myofasciitis and also involvement of the rotator cuff tendons and   biceps tendon.   2.  4 x 3 cm bursal abscess.  ASSESSMENT AND PLAN:1. 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 Dr. Ophelia Charter to perform arthroscopic and irrigation and drainage todayDr. Daiva Eves advises restarting ancef after the surgeryMorphine for pain controlPre-op care- PT/PTT, EKG, NPO2. A-fibContinue diltiazem and metoprolol at home dose after the surgery3. DepressionContinue sertraline4. CHFHer last echo in 3/10 was in 2010 showed an EF of 60% with normal systolic function. She is on lasix 80 po bid and metoprolol - holding lasix for now.5. H/o DVT/PE- will hold coumadin for now, restart after surgery6. DM- check HbA1C- SSI moderate and lantus 20 units7. DVT Px- SCD 8. Vaginal itching/dysuria Likely 2/2 vaginal candidiasis, will treat emprically with diflucan 9. Dispo: d/c after surgery per Dr. Ophelia Charter.Will ask PT to evaluate prior to dischargeWill arrange for outpatient follow up with ID and Ortho

## 2010-04-28 NOTE — Progress Notes (Addendum)
Summary: Care Plan Oversight  Phone Note Outgoing Call   Call placed by: Acey Lav MD,  March 07, 2010 5:41 PM Details for Reason: Care Plan Oversight December Summary of Call: [phone] (30 or more mins)  I have supervised home care and/or infusion therapy for this pt, including providing orders for care, review of labs and/or home health care plans, communicating with the home health care professionals and/or patient/caregivers to integrate current information into the medical treatment plan and/or adjust the medical therapy. This supervision has been provided for _32_minutes during the calendar month. Dates for this oversight 03/05/10 thru 11/09/12_.  MSSA septic shoulder   Initial call taken by: Acey Lav MD,  March 07, 2010 5:41 PM

## 2010-04-28 NOTE — Miscellaneous (Signed)
Summary: [facility]: CBCw/Diff  [facility]: CBCw/Diff   Imported By: Florinda Marker 04/11/2010 12:02:05  _____________________________________________________________________  External Attachment:    Type:   Image     Comment:   External Document

## 2010-05-02 ENCOUNTER — Encounter: Payer: Self-pay | Admitting: Infectious Disease

## 2010-05-04 NOTE — Progress Notes (Signed)
Summary: Pt. was receiving correct dose of Cefazolin, 2 grams  Phone Note Other Incoming   CallerMarcelino Duster @ [place], [phone], ext [phone] Summary of Call: Message left.  Marcelino Duster called the [facility] to find out what dose of Cefazolin the pt. was receiving.  The pharmacy told her that the pt. WAS receiving 2 grams.  The pharmacy statedt that they had clarified the dose when the pt. was admitted to the [facility] service.  [facility] wants to know whether Dr. Daiva Eves still wants to continue the medication knowing that the dose was correct.   Please advise. Jennet Maduro RN  April 27, 2010 10:01 AM   Follow-up for Phone Call        2 grams three times a day is perfect Follow-up by: Acey Lav MD,  April 27, 2010 11:48 AM     Appended Document: Pt. was receiving correct dose of Cefazolin, 2 grams RN spoke with Marcelino Duster at [place].  Confirmed the dose and length of treatment for Cefazolin 2 grams.

## 2010-05-04 NOTE — Progress Notes (Signed)
Summary: Sed rate values and antibiotic dose needs confirmed  Phone Note From Pharmacy   Caller: [facility] Summary of Call: Wanting to know how [place] the pt. is going to remain on the IV antibiotic.  Will fax lab work from [place].  [facility] sed rates are elevated above the sed rate which was drawn at the Center.   Rn asked the pharmacy to check with [facility] about the sed rates that were previously drawn.  [facility] lab reports to be faxed to the Center.  Also, need to check on the dose of  antibiotic.  There is a discrepancy between 1 gram vs 2 grams every 8 hours.   Please advise. Jennet Maduro RN  April 25, 2010 3:33 PM   Follow-up for Phone Call        Please have them increase the ancef to 2 grams iv three times a day. that is ithe dose I wanted. She should continue antibiotics until February 25th. I can work her in to [facility] again here. Have they gotten ESR after ours? and is [facility]? OUrs was normal? We can recheck it in our clinic, I will try to work her in before the end of the month Follow-up by: Acey Lav MD,  April 26, 2010 8:25 AM  Additional Follow-up for Phone Call Additional follow up Details #1::        Phone call to [place] to give new antibiotic dose and continuation on therapy until Feb. 25.  Also, shared the discrepancy in the Sed rate values.  RN will call pt. to set up next visit w/ Dr. Daiva Eves. Pt's next OV is 05/23/2010 @ 3:45 PM.  Jennet Maduro RN  April 26, 2010 3:55 PM     Additional Follow-up for Phone Call Additional follow up Details #2::    thanks Angelique Blonder! Follow-up by: Acey Lav MD,  April 27, 2010 8:12 AM  New/Updated Medications: CEFAZOLIN SODIUM 1 GM SOLR (CEFAZOLIN SODIUM) Infuse 2g IV q8h.

## 2010-05-09 ENCOUNTER — Telehealth: Payer: Self-pay | Admitting: Infectious Disease

## 2010-05-09 ENCOUNTER — Encounter: Payer: Self-pay | Admitting: Infectious Disease

## 2010-05-10 ENCOUNTER — Telehealth: Payer: Self-pay | Admitting: Infectious Disease

## 2010-05-18 NOTE — Progress Notes (Signed)
Summary: Care Plan Oversight  Phone Note Outgoing Call   Call placed by: Acey Lav MD,  May 09, 2010 12:24 PM Details for Reason: Care Plan Oversight Summary of Call: [phone] (30 or more mins)  I have supervised home care and/or infusion therapy for this pt, including providing orders for care, review of labs and/or home health care plans, communicating with the home health care professionals and/or patient/caregivers to integrate current information into the medical treatment plan and/or adjust the medical therapy. This supervision has been provided for _32__minutes during the calendar month. Dates for this oversight  05/05/2010 thru 06/06/2010.  Septic arthritis with MSSA  Initial call taken by: Acey Lav MD,  May 09, 2010 12:25 PM

## 2010-05-18 NOTE — Progress Notes (Signed)
Summary: wants picc line out  Phone Note Call from Patient   Caller: Patient Call For: Dr. Daiva Eves Summary of Call: The patient called wanting to have picc line out before her visit on 05/23/2010 to see Dr. Daiva Eves. She states tha she saw Dr. Ophelia Charter about a week ago and he said she could have it removed. Please advise Initial call taken by: Starleen Arms CMA,  May 10, 2010 10:55 AM  Follow-up for Phone Call        I am OK with that IF she starts keflex 500mg  two tablets two times a day in meantime  Follow-up by: Acey Lav MD,  May 10, 2010 8:47 PM  Additional Follow-up for Phone Call Additional follow up Details #1::        ok will notify patient I called patients nurse from [facility] [place]-[phone] Additional Follow-up by: Starleen Arms CMA,  May 11, 2010 9:00 AM    New/Updated Medications: CEPHALEXIN 500 MG CAPS (CEPHALEXIN) take two tablets two times a day until instructed by Dr Daiva Eves Prescriptions: CEPHALEXIN 500 MG CAPS (CEPHALEXIN) take two tablets two times a day until instructed by Dr Daiva Eves  #120 x 4   Entered and Authorized by:   Acey Lav MD   Signed by:   Starleen Arms CMA on 05/11/2010   Method used:   Electronically to        [facility] # [phone]* (retail)       [address]       [place], [place]  [address]       Ph: [phone]       Fax: [phone]   RxID:   [id number]

## 2010-05-23 ENCOUNTER — Encounter: Payer: Self-pay | Admitting: Internal Medicine

## 2010-05-23 ENCOUNTER — Ambulatory Visit (INDEPENDENT_AMBULATORY_CARE_PROVIDER_SITE_OTHER): Payer: Federal, State, Local not specified - PPO | Admitting: Infectious Disease

## 2010-05-23 ENCOUNTER — Encounter: Payer: Self-pay | Admitting: Infectious Disease

## 2010-05-23 DIAGNOSIS — B3731 Acute candidiasis of vulva and vagina: Secondary | ICD-10-CM | POA: Insufficient documentation

## 2010-05-23 DIAGNOSIS — M009 Pyogenic arthritis, unspecified: Secondary | ICD-10-CM

## 2010-05-23 DIAGNOSIS — A4101 Sepsis due to Methicillin susceptible Staphylococcus aureus: Secondary | ICD-10-CM

## 2010-05-23 DIAGNOSIS — B373 Candidiasis of vulva and vagina: Secondary | ICD-10-CM

## 2010-05-23 DIAGNOSIS — M869 Osteomyelitis, unspecified: Secondary | ICD-10-CM

## 2010-05-24 LAB — CONVERTED CEMR LAB
BUN: 26 mg/dL — ABNORMAL HIGH (ref 6–23)
Basophils Absolute: 0 10*3/uL (ref 0.0–0.1)
Basophils Relative: 1 % (ref 0–1)
CO2: 25 meq/L (ref 19–32)
CRP: 1.2 mg/dL — ABNORMAL HIGH (ref <0.6)
Calcium: 8.8 mg/dL (ref 8.4–10.5)
Chloride: 99 meq/L (ref 96–112)
Creatinine, Ser: 1.31 mg/dL — ABNORMAL HIGH (ref 0.40–1.20)
Lymphocytes Relative: 51 % — ABNORMAL HIGH (ref 12–46)
MCHC: 32.2 g/dL (ref 30.0–36.0)
Monocytes Absolute: 0.4 10*3/uL (ref 0.1–1.0)
Neutro Abs: 1.5 10*3/uL — ABNORMAL LOW (ref 1.7–7.7)
Neutrophils Relative %: 37 % — ABNORMAL LOW (ref 43–77)
Platelets: 286 10*3/uL (ref 150–400)
RDW: 14.5 % (ref 11.5–15.5)
Sed Rate: 40 mm/hr — ABNORMAL HIGH (ref 0–22)

## 2010-06-02 NOTE — Assessment & Plan Note (Signed)
Summary: per denise f/u iv therapy   Vital Signs:  Patient profile:   65 year old female Height:      63.5 inches (161.29 cm) Weight:      240 pounds (109.09 kg) BMI:     42.00 Temp:     98.2 degrees F (36.78 degrees C) oral Pulse rate:   64 / minute BP sitting:   111 / 72  (left arm)  Vitals Entered By: Wendall Mola CMA Duncan Dull) (May 23, 2010 3:57 PM) CC: pt. having problems with antibiotic, yeast infection, IV removed by home health Is Patient Diabetic? Yes Did you bring your meter with you today? No Pain Assessment Patient in pain? yes     Location: groin Intensity: 6 Type: burning Onset of pain  Constant Nutritional Status BMI of > 30 = obese Nutritional Status Detail appetite "not good"  Have you ever been in a relationship where you felt threatened, hurt or afraid?No   Does patient need assistance? Functional Status Self care Ambulation Normal Comments no missed doses of meds per pt.   Visit Type:  Follow-up Referring Provider:  Annell Greening Primary Provider:  Renford Dills  CC:  pt. having problems with antibiotic, yeast infection, and IV removed by home health.  History of Present Illness:  65 yo with complicated PMHX who was found to have MSSA septic shoulder in November , sp washout by Dr. Ophelia Charter. We placed her on ancef and she continued on this since then. In interim she was seen  and found to have an elevated ESR and repeat MRI showed area concerning for residual abscess along with osteomyelitis of the humerus. She underwent repeat surgery by Dr. Ophelia Charter who encountered fluid along with a torn biceps tendone but no frank purulence and she was continued on ancef (though dc note mentions 1g three times a day rather than 2 g three times a day). She was seen by Dr Ophelia Charter and still had elevated ESR and continued on IV ancef, with then improvement in ESR. She was changed from IV ancef over to oral keflex a few weeks ago. She has done well but is upset because ehs  has developed a severe vaginal yeast infection. Her shoulder hurts occsoinally with movment but not much at rest. She is without fevers, chills or systemic symptoms.  Preventive Screening-Counseling & Management  Alcohol-Tobacco     Alcohol drinks/day: 0     Smoking Status: never  Caffeine-Diet-Exercise     Caffeine use/day: occasional  Safety-Violence-Falls     Seat Belt Use: yes  Allergies: 1)  ! * Shellfish  Past History:  Past Medical History: Last updated: 03/07/2010   1. Atrial fibrillation/atrial flutter with failed ablation.   2. DM.   3. Hypertension.   4. 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   5. Right lower extremity DVT.   6. Obesity.   7. Anemia.   8. Diabetic neuropathy.   9. GERD.   10.Anxiety/depression.   11.CHF with an EF 55%.   12.Hyperlipidemia.  13 Recurrent Zoster 14. Problems with breast enlargement of unknown cause (negative mammogram)     Family History: Last updated: 03/07/2010 :  Noncontributory.   Social History: Last updated: 03/07/2010  Denies tobacco abuse, alcohol abuse, or drug abuse.   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   Risk Factors: Alcohol Use: 0 (05/23/2010) Caffeine Use: occasional (05/23/2010)  Risk Factors: Smoking Status: never (05/23/2010)  Past Surgical History: see above  Review of Systems  as per HPI otherwise negative on 12 point review  Physical Exam  General:  alert, well-nourished, well-hydrated, and overweight-appearing.   Head:  normocephalic, atraumatic, and no abnormalities observed.   Eyes:  vision grossly intact, pupils equal, and pupils round.   Ears:   ear piercing(s) noted.  Nose:  no external deformity and no external erythema.   Mouth:  no dental plaque, pharynx pink and moist, and no erythema.   Neck:  supple and full ROM.   Lungs:  normal respiratory effort, no crackles, and no wheezes.   Heart:  normal rate, regular rhythm, no murmur, no gallop, and no rub.   Abdomen:  soft,  non-tender, and normal bowel sounds.   Msk:  reduced rom in the right shoulder with reduced elevaation and abduciton of the shoulder. Surgical scar CDI Extremities:  1+ left pedal edema and 1+ right pedal edema.   Neurologic:  alert & oriented X3, strength normal in all extremities, and gait normal.   Skin:  healed rash in  the V3 dermatome Psych:  Oriented X3, memory intact for recent and remote, and normally interactive.     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 Will let her stop keflex today and check inflammatory markers Her updated medication list for this problem includes:    Oxycodone Hcl 5 Mg Tabs (Oxycodone hcl) .Marland Kitchen... Take 1-2 tabs by mouth every 4hours as needed for pain    Cephalexin 500 Mg Caps (Cephalexin) .Marland Kitchen... Take two tablets two times a day until instructed by dr Zenaida Niece dam  Orders: T-Basic Metabolic Panel [phone]) T-CBC w/Diff [phone]) T-C-Reactive Protein [phone]) T-Sed Rate (Automated) [phone]) Est. 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 Patient Level IV (24401)  Problem # 3:  CANDIDIASIS OF VULVA AND VAGINA (ICD-112.1)  will give her fluconazole course Her updated medication list for this problem includes:    Fluconazole 100 Mg Tabs (Fluconazole) .Marland Kitchen... Take two tablets once then one a day for 13 days  Orders: Est. Patient Level IV (02725)  Problem # 4:  ATRIAL FLUTTER, CHRONIC (ICD-427.32) on coumadin. Will cc her PCP as she will continue to need INRs monitored with antibacterials stopped but fluconazoe course started Her updated medication list for this problem includes:    Warfarin Sodium 5 Mg Tabs (Warfarin sodium) .Marland Kitchen... Take as directed    Diltiazem Hcl Er Beads 120 Mg Xr24h-cap (Diltiazem hcl er beads) .Marland Kitchen... Take 1 tablet by mouth once  a day    Metoprolol Succinate 100 Mg Xr24h-tab (Metoprolol succinate) .Marland Kitchen... 1/2 tab by mouth daily  Medications Added to Medication List This Visit: 1)  Fluconazole 100 Mg Tabs (Fluconazole) .... Take two tablets once then one a day for 13 days  Patient Instructions: 1)  we will check blood work today 2)  if it is discouraging I may call you back to restart it 3)  otherwise you  can stay off of the cephalexin 4)  take the fluconazole for full course 5)  rtc to see Dr Daiva Eves in 2 months Prescriptions: FLUCONAZOLE 100 MG TABS (FLUCONAZOLE) take two tablets once then one a day for 13 days  #15 x 3   Entered and Authorized by:   Acey Lav MD   Signed by:   Paulette Blanch Dam MD on 05/23/2010   Method used:   Electronically to  [facility] [address] # [phone]* (retail)       [address]       [place], [place]  [address]       Ph: [phone]       Fax: [phone]   RxID:   [id number]    Orders Added: 1)  T-CBC w/Diff [[id number]] 2)  T-C-Reactive Protein [phone] 3)  T-Sed Rate (Automated) [[id number]] 4)  T-Basic Metabolic Panel [[id number]] 5)  T-CBC w/Diff [[id number]] 6)  T-C-Reactive Protein [66063-01601] 7)  T-Sed Rate (Automated) [[id number]] 8)  Est. Patient Level IV [20254]

## 2010-06-02 NOTE — Miscellaneous (Signed)
Summary: [name]: Orders  [facility]: Orders   Imported By: Florinda Marker 05/24/2010 15:58:33  _____________________________________________________________________  External Attachment:    Type:   Image     Comment:   External Document

## 2010-06-03 ENCOUNTER — Encounter: Payer: Self-pay | Admitting: *Deleted

## 2010-06-06 ENCOUNTER — Encounter: Payer: Self-pay | Admitting: *Deleted

## 2010-06-06 LAB — BASIC METABOLIC PANEL
Calcium: 8.3 mg/dL — ABNORMAL LOW (ref 8.4–10.5)
Creatinine, Ser: 1.33 mg/dL — ABNORMAL HIGH (ref 0.4–1.2)
GFR calc Af Amer: 49 mL/min — ABNORMAL LOW (ref >60)
GFR calc non Af Amer: 40 mL/min — ABNORMAL LOW (ref >60)

## 2010-06-06 LAB — GLUCOSE, CAPILLARY
Glucose-Capillary: 104 mg/dL — ABNORMAL HIGH (ref 70–99)
Glucose-Capillary: 65 mg/dL — ABNORMAL LOW (ref 70–99)
Glucose-Capillary: 82 mg/dL (ref 70–99)

## 2010-06-06 LAB — HEMOGLOBIN A1C
Hgb A1c MFr Bld: 8.7 % — ABNORMAL HIGH (ref <5.7)
Mean Plasma Glucose: 203 mg/dL — ABNORMAL HIGH (ref <117)

## 2010-06-06 LAB — COMPREHENSIVE METABOLIC PANEL
Alkaline Phosphatase: 82 U/L (ref 39–117)
BUN: 43 mg/dL — ABNORMAL HIGH (ref 6–23)
Calcium: 8.7 mg/dL (ref 8.4–10.5)
Creatinine, Ser: 1.52 mg/dL — ABNORMAL HIGH (ref 0.4–1.2)
Glucose, Bld: 97 mg/dL (ref 70–99)
Total Protein: 8 g/dL (ref 6.0–8.3)

## 2010-06-06 LAB — CBC
HCT: 29.2 % — ABNORMAL LOW (ref 36.0–46.0)
MCH: 29.7 pg (ref 26.0–34.0)
MCHC: 32.9 g/dL (ref 30.0–36.0)
MCV: 90.4 fL (ref 78.0–100.0)
Platelets: 290 10*3/uL (ref 150–400)
RBC: 3.07 MIL/uL — ABNORMAL LOW (ref 3.87–5.11)
RDW: 15.2 % (ref 11.5–15.5)
WBC: 7.6 10*3/uL (ref 4.0–10.5)

## 2010-06-06 LAB — CULTURE, ROUTINE-ABSCESS

## 2010-06-06 LAB — APTT: aPTT: 39 seconds — ABNORMAL HIGH (ref 24–37)

## 2010-06-06 LAB — ANAEROBIC CULTURE

## 2010-06-06 LAB — DIFFERENTIAL
Basophils Relative: 0 % (ref 0–1)
Monocytes Relative: 6 % (ref 3–12)
Neutro Abs: 4.3 10*3/uL (ref 1.7–7.7)
Neutrophils Relative %: 52 % (ref 43–77)

## 2010-06-07 LAB — WOUND CULTURE

## 2010-06-07 LAB — BASIC METABOLIC PANEL
BUN: 19 mg/dL (ref 6–23)
CO2: 21 mEq/L (ref 19–32)
CO2: 23 mEq/L (ref 19–32)
CO2: 24 mEq/L (ref 19–32)
CO2: 24 mEq/L (ref 19–32)
Calcium: 8.2 mg/dL — ABNORMAL LOW (ref 8.4–10.5)
Calcium: 8.3 mg/dL — ABNORMAL LOW (ref 8.4–10.5)
Calcium: 8.3 mg/dL — ABNORMAL LOW (ref 8.4–10.5)
Calcium: 8.6 mg/dL (ref 8.4–10.5)
Chloride: 101 mEq/L (ref 96–112)
Chloride: 103 mEq/L (ref 96–112)
Chloride: 103 mEq/L (ref 96–112)
Creatinine, Ser: 1.49 mg/dL — ABNORMAL HIGH (ref 0.4–1.2)
Creatinine, Ser: 1.67 mg/dL — ABNORMAL HIGH (ref 0.4–1.2)
Creatinine, Ser: 1.78 mg/dL — ABNORMAL HIGH (ref 0.4–1.2)
Creatinine, Ser: 1.91 mg/dL — ABNORMAL HIGH (ref 0.4–1.2)
GFR calc Af Amer: 28 mL/min — ABNORMAL LOW (ref >60)
GFR calc Af Amer: 38 mL/min — ABNORMAL LOW (ref >60)
GFR calc Af Amer: 38 mL/min — ABNORMAL LOW (ref >60)
GFR calc Af Amer: 43 mL/min — ABNORMAL LOW (ref >60)
GFR calc non Af Amer: 29 mL/min — ABNORMAL LOW (ref >60)
GFR calc non Af Amer: 31 mL/min — ABNORMAL LOW (ref >60)
GFR calc non Af Amer: 36 mL/min — ABNORMAL LOW (ref >60)
Glucose, Bld: 223 mg/dL — ABNORMAL HIGH (ref 70–99)
Potassium: 3.3 mEq/L — ABNORMAL LOW (ref 3.5–5.1)
Potassium: 3.4 mEq/L — ABNORMAL LOW (ref 3.5–5.1)
Potassium: 3.7 mEq/L (ref 3.5–5.1)
Sodium: 133 mEq/L — ABNORMAL LOW (ref 135–145)
Sodium: 134 mEq/L — ABNORMAL LOW (ref 135–145)
Sodium: 135 mEq/L (ref 135–145)
Sodium: 136 mEq/L (ref 135–145)
Sodium: 137 mEq/L (ref 135–145)

## 2010-06-07 LAB — GLUCOSE, CAPILLARY
Glucose-Capillary: 147 mg/dL — ABNORMAL HIGH (ref 70–99)
Glucose-Capillary: 148 mg/dL — ABNORMAL HIGH (ref 70–99)
Glucose-Capillary: 167 mg/dL — ABNORMAL HIGH (ref 70–99)
Glucose-Capillary: 171 mg/dL — ABNORMAL HIGH (ref 70–99)
Glucose-Capillary: 172 mg/dL — ABNORMAL HIGH (ref 70–99)
Glucose-Capillary: 175 mg/dL — ABNORMAL HIGH (ref 70–99)
Glucose-Capillary: 177 mg/dL — ABNORMAL HIGH (ref 70–99)
Glucose-Capillary: 181 mg/dL — ABNORMAL HIGH (ref 70–99)
Glucose-Capillary: 185 mg/dL — ABNORMAL HIGH (ref 70–99)
Glucose-Capillary: 195 mg/dL — ABNORMAL HIGH (ref 70–99)
Glucose-Capillary: 207 mg/dL — ABNORMAL HIGH (ref 70–99)
Glucose-Capillary: 209 mg/dL — ABNORMAL HIGH (ref 70–99)
Glucose-Capillary: 213 mg/dL — ABNORMAL HIGH (ref 70–99)
Glucose-Capillary: 215 mg/dL — ABNORMAL HIGH (ref 70–99)
Glucose-Capillary: 221 mg/dL — ABNORMAL HIGH (ref 70–99)
Glucose-Capillary: 234 mg/dL — ABNORMAL HIGH (ref 70–99)
Glucose-Capillary: 241 mg/dL — ABNORMAL HIGH (ref 70–99)
Glucose-Capillary: 244 mg/dL — ABNORMAL HIGH (ref 70–99)
Glucose-Capillary: 245 mg/dL — ABNORMAL HIGH (ref 70–99)
Glucose-Capillary: 246 mg/dL — ABNORMAL HIGH (ref 70–99)
Glucose-Capillary: 288 mg/dL — ABNORMAL HIGH (ref 70–99)
Glucose-Capillary: 328 mg/dL — ABNORMAL HIGH (ref 70–99)
Glucose-Capillary: 357 mg/dL — ABNORMAL HIGH (ref 70–99)
Glucose-Capillary: 412 mg/dL — ABNORMAL HIGH (ref 70–99)
Glucose-Capillary: 440 mg/dL — ABNORMAL HIGH (ref 70–99)

## 2010-06-07 LAB — DIFFERENTIAL
Basophils Absolute: 0 10*3/uL (ref 0.0–0.1)
Basophils Relative: 0 % (ref 0–1)
Eosinophils Absolute: 0 10*3/uL (ref 0.0–0.7)
Monocytes Absolute: 1 10*3/uL (ref 0.1–1.0)
Neutro Abs: 12.6 10*3/uL — ABNORMAL HIGH (ref 1.7–7.7)
Neutrophils Relative %: 85 % — ABNORMAL HIGH (ref 43–77)

## 2010-06-07 LAB — GRAM STAIN

## 2010-06-07 LAB — PREPARE FRESH FROZEN PLASMA
Unit division: 0
Unit division: 0

## 2010-06-07 LAB — ANAEROBIC CULTURE

## 2010-06-07 LAB — CBC
HCT: 37.7 % (ref 36.0–46.0)
HCT: 38.3 % (ref 36.0–46.0)
Hemoglobin: 10.2 g/dL — ABNORMAL LOW (ref 12.0–15.0)
MCH: 29.8 pg (ref 26.0–34.0)
MCH: 30.1 pg (ref 26.0–34.0)
MCH: 30.3 pg (ref 26.0–34.0)
MCH: 30.9 pg (ref 26.0–34.0)
MCHC: 33.8 g/dL (ref 30.0–36.0)
MCHC: 34.2 g/dL (ref 30.0–36.0)
MCHC: 34.5 g/dL (ref 30.0–36.0)
MCV: 89.6 fL (ref 78.0–100.0)
MCV: 90.3 fL (ref 78.0–100.0)
MCV: 91 fL (ref 78.0–100.0)
MCV: 91.2 fL (ref 78.0–100.0)
Platelets: 327 10*3/uL (ref 150–400)
Platelets: 328 10*3/uL (ref 150–400)
Platelets: 451 10*3/uL — ABNORMAL HIGH (ref 150–400)
RBC: 3.29 MIL/uL — ABNORMAL LOW (ref 3.87–5.11)
RBC: 3.39 MIL/uL — ABNORMAL LOW (ref 3.87–5.11)
RDW: 14.7 % (ref 11.5–15.5)
RDW: 14.9 % (ref 11.5–15.5)
WBC: 12 10*3/uL — ABNORMAL HIGH (ref 4.0–10.5)

## 2010-06-07 LAB — URINALYSIS, ROUTINE W REFLEX MICROSCOPIC
Bilirubin Urine: NEGATIVE
Ketones, ur: 15 mg/dL — AB
Leukocytes, UA: NEGATIVE
Nitrite: NEGATIVE
Protein, ur: 100 mg/dL — AB
Urobilinogen, UA: 1 mg/dL (ref 0.0–1.0)
pH: 6 (ref 5.0–8.0)

## 2010-06-07 LAB — APTT
aPTT: 41 seconds — ABNORMAL HIGH (ref 24–37)
aPTT: 42 seconds — ABNORMAL HIGH (ref 24–37)

## 2010-06-07 LAB — PROTIME-INR
INR: 1.27 (ref 0.00–1.49)
INR: 1.64 — ABNORMAL HIGH (ref 0.00–1.49)
INR: 1.72 — ABNORMAL HIGH (ref 0.00–1.49)
INR: 1.94 — ABNORMAL HIGH (ref 0.00–1.49)
INR: 1.97 — ABNORMAL HIGH (ref 0.00–1.49)
Prothrombin Time: 20.3 seconds — ABNORMAL HIGH (ref 11.6–15.2)
Prothrombin Time: 22.3 seconds — ABNORMAL HIGH (ref 11.6–15.2)
Prothrombin Time: 22.6 seconds — ABNORMAL HIGH (ref 11.6–15.2)
Prothrombin Time: 24.1 seconds — ABNORMAL HIGH (ref 11.6–15.2)

## 2010-06-07 LAB — POCT I-STAT, CHEM 8
Calcium, Ion: 1 mmol/L — ABNORMAL LOW (ref 1.12–1.32)
Glucose, Bld: 491 mg/dL — ABNORMAL HIGH (ref 70–99)
HCT: 44 % (ref 36.0–46.0)
Hemoglobin: 15 g/dL (ref 12.0–15.0)

## 2010-06-07 LAB — POCT CARDIAC MARKERS
CKMB, poc: <1 ng/mL — ABNORMAL LOW (ref 1.0–8.0)
Myoglobin, poc: 146 ng/mL (ref 12–200)

## 2010-06-07 LAB — CULTURE, BLOOD (ROUTINE X 2): Culture: NO GROWTH

## 2010-06-07 LAB — SEDIMENTATION RATE: Sed Rate: 115 mm/hr — ABNORMAL HIGH (ref 0–22)

## 2010-06-07 LAB — HEPATIC FUNCTION PANEL
Albumin: 3.1 g/dL — ABNORMAL LOW (ref 3.5–5.2)
Bilirubin, Direct: 0.4 mg/dL — ABNORMAL HIGH (ref 0.0–0.3)
Indirect Bilirubin: 1.5 mg/dL — ABNORMAL HIGH (ref 0.3–0.9)
Total Bilirubin: 1.9 mg/dL — ABNORMAL HIGH (ref 0.3–1.2)

## 2010-06-07 LAB — BODY FLUID CULTURE

## 2010-06-07 LAB — TYPE AND SCREEN
ABO/RH(D): A POS
Antibody Screen: NEGATIVE

## 2010-06-07 LAB — MRSA PCR SCREENING: MRSA by PCR: NEGATIVE

## 2010-06-07 LAB — LIPASE, BLOOD: Lipase: 15 U/L (ref 11–59)

## 2010-06-07 LAB — BRAIN NATRIURETIC PEPTIDE: Pro B Natriuretic peptide (BNP): 247 pg/mL — ABNORMAL HIGH (ref 0.0–100.0)

## 2010-06-09 LAB — GC/CHLAMYDIA PROBE AMP, GENITAL: Chlamydia, DNA Probe: NEGATIVE

## 2010-06-09 LAB — WET PREP, GENITAL
Clue Cells Wet Prep HPF POC: NONE SEEN
WBC, Wet Prep HPF POC: NONE SEEN

## 2010-06-12 LAB — DIFFERENTIAL
Basophils Absolute: 0 10*3/uL (ref 0.0–0.1)
Eosinophils Relative: 2 % (ref 0–5)
Lymphocytes Relative: 24 % (ref 12–46)
Monocytes Absolute: 0.2 10*3/uL (ref 0.1–1.0)
Monocytes Relative: 4 % (ref 3–12)

## 2010-06-12 LAB — URINE CULTURE

## 2010-06-12 LAB — URINALYSIS, ROUTINE W REFLEX MICROSCOPIC
Glucose, UA: >1000 mg/dL — AB
Ketones, ur: NEGATIVE mg/dL
Specific Gravity, Urine: 1.028 (ref 1.005–1.030)
pH: 6 (ref 5.0–8.0)

## 2010-06-12 LAB — TYPE AND SCREEN: Antibody Screen: NEGATIVE

## 2010-06-12 LAB — CBC
MCV: 88.8 fL (ref 78.0–100.0)
Platelets: 254 10*3/uL (ref 150–400)
WBC: 5 10*3/uL (ref 4.0–10.5)

## 2010-06-12 LAB — URINE MICROSCOPIC-ADD ON

## 2010-06-12 LAB — COMPREHENSIVE METABOLIC PANEL
AST: 20 U/L (ref 0–37)
Albumin: 3.6 g/dL (ref 3.5–5.2)
Chloride: 98 mEq/L (ref 96–112)
Creatinine, Ser: 1.43 mg/dL — ABNORMAL HIGH (ref 0.4–1.2)
GFR calc Af Amer: 45 mL/min — ABNORMAL LOW (ref >60)
Potassium: 4 mEq/L (ref 3.5–5.1)
Total Bilirubin: 0.5 mg/dL (ref 0.3–1.2)
Total Protein: 6.3 g/dL (ref 6.0–8.3)

## 2010-06-15 LAB — GLUCOSE, CAPILLARY
Glucose-Capillary: 149 mg/dL — ABNORMAL HIGH (ref 70–99)
Glucose-Capillary: 167 mg/dL — ABNORMAL HIGH (ref 70–99)
Glucose-Capillary: 169 mg/dL — ABNORMAL HIGH (ref 70–99)
Glucose-Capillary: 172 mg/dL — ABNORMAL HIGH (ref 70–99)
Glucose-Capillary: 213 mg/dL — ABNORMAL HIGH (ref 70–99)
Glucose-Capillary: 233 mg/dL — ABNORMAL HIGH (ref 70–99)
Glucose-Capillary: 274 mg/dL — ABNORMAL HIGH (ref 70–99)
Glucose-Capillary: 293 mg/dL — ABNORMAL HIGH (ref 70–99)
Glucose-Capillary: 309 mg/dL — ABNORMAL HIGH (ref 70–99)
Glucose-Capillary: 318 mg/dL — ABNORMAL HIGH (ref 70–99)
Glucose-Capillary: 384 mg/dL — ABNORMAL HIGH (ref 70–99)

## 2010-06-15 LAB — CROSSMATCH
ABO/RH(D): A POS
Antibody Screen: NEGATIVE

## 2010-06-15 LAB — APTT: aPTT: 24 seconds (ref 24–37)

## 2010-06-15 LAB — CBC
HCT: 27.1 % — ABNORMAL LOW (ref 36.0–46.0)
HCT: 29.1 % — ABNORMAL LOW (ref 36.0–46.0)
HCT: 39.7 % (ref 36.0–46.0)
Hemoglobin: 10.5 g/dL — ABNORMAL LOW (ref 12.0–15.0)
Hemoglobin: 13.2 g/dL (ref 12.0–15.0)
MCHC: 33.3 g/dL (ref 30.0–36.0)
MCHC: 33.8 g/dL (ref 30.0–36.0)
MCHC: 34.1 g/dL (ref 30.0–36.0)
MCV: 91.9 fL (ref 78.0–100.0)
MCV: 92.4 fL (ref 78.0–100.0)
Platelets: 214 10*3/uL (ref 150–400)
Platelets: 214 10*3/uL (ref 150–400)
Platelets: 276 10*3/uL (ref 150–400)
RBC: 3.15 MIL/uL — ABNORMAL LOW (ref 3.87–5.11)
RBC: 3.4 MIL/uL — ABNORMAL LOW (ref 3.87–5.11)
RDW: 15.6 % — ABNORMAL HIGH (ref 11.5–15.5)
RDW: 16.1 % — ABNORMAL HIGH (ref 11.5–15.5)
RDW: 16.4 % — ABNORMAL HIGH (ref 11.5–15.5)
WBC: 9.9 10*3/uL (ref 4.0–10.5)

## 2010-06-15 LAB — BASIC METABOLIC PANEL
BUN: 34 mg/dL — ABNORMAL HIGH (ref 6–23)
CO2: 25 mEq/L (ref 19–32)
Calcium: 7.8 mg/dL — ABNORMAL LOW (ref 8.4–10.5)
Chloride: 104 mEq/L (ref 96–112)
GFR calc Af Amer: 30 mL/min — ABNORMAL LOW (ref >60)
GFR calc non Af Amer: 25 mL/min — ABNORMAL LOW (ref >60)
GFR calc non Af Amer: 29 mL/min — ABNORMAL LOW (ref >60)
Glucose, Bld: 273 mg/dL — ABNORMAL HIGH (ref 70–99)
Potassium: 3.7 mEq/L (ref 3.5–5.1)
Sodium: 135 mEq/L (ref 135–145)

## 2010-06-15 LAB — URINE CULTURE

## 2010-06-15 LAB — URINALYSIS, ROUTINE W REFLEX MICROSCOPIC
Bilirubin Urine: NEGATIVE
Nitrite: NEGATIVE
Specific Gravity, Urine: 1.027 (ref 1.005–1.030)
Urobilinogen, UA: 0.2 mg/dL (ref 0.0–1.0)

## 2010-06-15 LAB — DIFFERENTIAL
Basophils Relative: 0 % (ref 0–1)
Lymphocytes Relative: 26 % (ref 12–46)
Lymphs Abs: 2.6 10*3/uL (ref 0.7–4.0)
Monocytes Relative: 5 % (ref 3–12)
Neutro Abs: 6.7 10*3/uL (ref 1.7–7.7)
Neutrophils Relative %: 67 % (ref 43–77)

## 2010-06-15 LAB — COMPREHENSIVE METABOLIC PANEL
ALT: 19 U/L (ref 0–35)
Alkaline Phosphatase: 74 U/L (ref 39–117)
BUN: 29 mg/dL — ABNORMAL HIGH (ref 6–23)
BUN: 40 mg/dL — ABNORMAL HIGH (ref 6–23)
Calcium: 8.8 mg/dL (ref 8.4–10.5)
Chloride: 102 mEq/L (ref 96–112)
Creatinine, Ser: 1.58 mg/dL — ABNORMAL HIGH (ref 0.4–1.2)
Glucose, Bld: 226 mg/dL — ABNORMAL HIGH (ref 70–99)
Glucose, Bld: 250 mg/dL — ABNORMAL HIGH (ref 70–99)
Potassium: 4.1 mEq/L (ref 3.5–5.1)
Sodium: 134 mEq/L — ABNORMAL LOW (ref 135–145)
Total Bilirubin: 0.8 mg/dL (ref 0.3–1.2)
Total Protein: 5.9 g/dL — ABNORMAL LOW (ref 6.0–8.3)
Total Protein: 7 g/dL (ref 6.0–8.3)

## 2010-06-15 LAB — PROTIME-INR
INR: 1.02 (ref 0.00–1.49)
INR: 1.07 (ref 0.00–1.49)
INR: 1.12 (ref 0.00–1.49)
INR: 1.4 (ref 0.00–1.49)
Prothrombin Time: 14.3 seconds (ref 11.6–15.2)

## 2010-06-15 LAB — TSH: TSH: 0.493 u[IU]/mL (ref 0.350–4.500)

## 2010-06-16 ENCOUNTER — Telehealth: Payer: Self-pay | Admitting: *Deleted

## 2010-06-16 NOTE — Telephone Encounter (Signed)
States she is waiting for a breast reduction. Needs medical clearance written & faxed to Dr. Annell Greening. Office [phone]. I called them to get Fax # [phone] & to verify the surgery would be done thru them. They are ORTHO. They are not doing this. Dr. Shon Hough will be doing the surgery. [phone] fax [phone]. I had to leave a message asked that they fax the form here. Called pt & told her as soon as we get the from, we will ask md to complete & will fax it to Dr. Charlesetta Garibaldi office.Faustino Congress

## 2010-06-17 ENCOUNTER — Emergency Department (HOSPITAL_COMMUNITY)
Admission: EM | Admit: 2010-06-17 | Discharge: 2010-06-17 | Disposition: A | Discharge status: Home / Self Care | Payer: Federal, State, Local not specified - PPO | Attending: Emergency Medicine | Admitting: Emergency Medicine

## 2010-06-17 ENCOUNTER — Emergency Department (HOSPITAL_COMMUNITY): Payer: Federal, State, Local not specified - PPO

## 2010-06-17 DIAGNOSIS — Z79899 Other long term (current) drug therapy: Secondary | ICD-10-CM | POA: Insufficient documentation

## 2010-06-17 DIAGNOSIS — Z794 Long term (current) use of insulin: Secondary | ICD-10-CM | POA: Insufficient documentation

## 2010-06-17 DIAGNOSIS — I509 Heart failure, unspecified: Secondary | ICD-10-CM | POA: Insufficient documentation

## 2010-06-17 DIAGNOSIS — E785 Hyperlipidemia, unspecified: Secondary | ICD-10-CM | POA: Insufficient documentation

## 2010-06-17 DIAGNOSIS — G8929 Other chronic pain: Secondary | ICD-10-CM | POA: Insufficient documentation

## 2010-06-17 DIAGNOSIS — R0989 Other specified symptoms and signs involving the circulatory and respiratory systems: Secondary | ICD-10-CM | POA: Insufficient documentation

## 2010-06-17 DIAGNOSIS — I10 Essential (primary) hypertension: Secondary | ICD-10-CM | POA: Insufficient documentation

## 2010-06-17 DIAGNOSIS — Z7901 Long term (current) use of anticoagulants: Secondary | ICD-10-CM | POA: Insufficient documentation

## 2010-06-17 DIAGNOSIS — W260XXA Contact with knife, initial encounter: Secondary | ICD-10-CM | POA: Insufficient documentation

## 2010-06-17 DIAGNOSIS — R791 Abnormal coagulation profile: Secondary | ICD-10-CM | POA: Insufficient documentation

## 2010-06-17 DIAGNOSIS — M1990 Unspecified osteoarthritis, unspecified site: Secondary | ICD-10-CM | POA: Insufficient documentation

## 2010-06-17 DIAGNOSIS — IMO0001 Reserved for inherently not codable concepts without codable children: Secondary | ICD-10-CM | POA: Insufficient documentation

## 2010-06-17 DIAGNOSIS — S61209A Unspecified open wound of unspecified finger without damage to nail, initial encounter: Secondary | ICD-10-CM | POA: Insufficient documentation

## 2010-06-17 DIAGNOSIS — T45515A Adverse effect of anticoagulants, initial encounter: Secondary | ICD-10-CM | POA: Insufficient documentation

## 2010-06-17 DIAGNOSIS — K219 Gastro-esophageal reflux disease without esophagitis: Secondary | ICD-10-CM | POA: Insufficient documentation

## 2010-06-17 DIAGNOSIS — R0609 Other forms of dyspnea: Secondary | ICD-10-CM | POA: Insufficient documentation

## 2010-06-17 DIAGNOSIS — M79609 Pain in unspecified limb: Secondary | ICD-10-CM | POA: Insufficient documentation

## 2010-06-17 DIAGNOSIS — Z86718 Personal history of other venous thrombosis and embolism: Secondary | ICD-10-CM | POA: Insufficient documentation

## 2010-06-17 DIAGNOSIS — Y92009 Unspecified place in unspecified non-institutional (private) residence as the place of occurrence of the external cause: Secondary | ICD-10-CM | POA: Insufficient documentation

## 2010-06-17 DIAGNOSIS — I4891 Unspecified atrial fibrillation: Secondary | ICD-10-CM | POA: Insufficient documentation

## 2010-06-17 DIAGNOSIS — M129 Arthropathy, unspecified: Secondary | ICD-10-CM | POA: Insufficient documentation

## 2010-06-17 DIAGNOSIS — M7989 Other specified soft tissue disorders: Secondary | ICD-10-CM | POA: Insufficient documentation

## 2010-06-17 DIAGNOSIS — R609 Edema, unspecified: Secondary | ICD-10-CM | POA: Insufficient documentation

## 2010-06-17 LAB — CK TOTAL AND CKMB (NOT AT ARMC): Total CK: 243 U/L — ABNORMAL HIGH (ref 7–177)

## 2010-06-17 LAB — BASIC METABOLIC PANEL
BUN: 34 mg/dL — ABNORMAL HIGH (ref 6–23)
CO2: 29 mEq/L (ref 19–32)
Chloride: 97 mEq/L (ref 96–112)
Creatinine, Ser: 1.78 mg/dL — ABNORMAL HIGH (ref 0.4–1.2)
Glucose, Bld: 290 mg/dL — ABNORMAL HIGH (ref 70–99)
Potassium: 4 mEq/L (ref 3.5–5.1)

## 2010-06-17 LAB — URINALYSIS, ROUTINE W REFLEX MICROSCOPIC
Hgb urine dipstick: NEGATIVE
Ketones, ur: NEGATIVE mg/dL
Protein, ur: NEGATIVE mg/dL
Urobilinogen, UA: 0.2 mg/dL (ref 0.0–1.0)

## 2010-06-17 LAB — CBC
HCT: 30.2 % — ABNORMAL LOW (ref 36.0–46.0)
MCH: 28.8 pg (ref 26.0–34.0)
MCV: 87 fL (ref 78.0–100.0)
Platelets: 332 10*3/uL (ref 150–400)
RBC: 3.47 MIL/uL — ABNORMAL LOW (ref 3.87–5.11)
WBC: 10 10*3/uL (ref 4.0–10.5)

## 2010-06-17 LAB — DIFFERENTIAL
Eosinophils Absolute: 0.1 10*3/uL (ref 0.0–0.7)
Eosinophils Relative: 1 % (ref 0–5)
Lymphocytes Relative: 19 % (ref 12–46)
Lymphs Abs: 1.9 10*3/uL (ref 0.7–4.0)
Monocytes Relative: 5 % (ref 3–12)
Neutrophils Relative %: 75 % (ref 43–77)

## 2010-06-17 LAB — BRAIN NATRIURETIC PEPTIDE: Pro B Natriuretic peptide (BNP): 46 pg/mL (ref 0.0–100.0)

## 2010-06-17 LAB — PROTIME-INR
INR: >10 (ref 0.00–1.49)
Prothrombin Time: >90 seconds — ABNORMAL HIGH (ref 11.6–15.2)

## 2010-06-17 LAB — TROPONIN I: Troponin I: 0.01 ng/mL (ref 0.00–0.06)

## 2010-06-27 LAB — POCT I-STAT, CHEM 8
BUN: 27 mg/dL — ABNORMAL HIGH (ref 6–23)
Calcium, Ion: 1.02 mmol/L — ABNORMAL LOW (ref 1.12–1.32)
Creatinine, Ser: 1.3 mg/dL — ABNORMAL HIGH (ref 0.4–1.2)
Hemoglobin: 12.9 g/dL (ref 12.0–15.0)
Sodium: 135 mEq/L (ref 135–145)
TCO2: 28 mmol/L (ref 0–100)

## 2010-06-27 LAB — DIFFERENTIAL
Basophils Absolute: 0.1 10*3/uL (ref 0.0–0.1)
Basophils Relative: 1 % (ref 0–1)
Eosinophils Absolute: 0.1 10*3/uL (ref 0.0–0.7)
Eosinophils Relative: 1 % (ref 0–5)
Neutrophils Relative %: 62 % (ref 43–77)

## 2010-06-27 LAB — BASIC METABOLIC PANEL
BUN: 24 mg/dL — ABNORMAL HIGH (ref 6–23)
Chloride: 94 mEq/L — ABNORMAL LOW (ref 96–112)
Creatinine, Ser: 1.39 mg/dL — ABNORMAL HIGH (ref 0.4–1.2)
Glucose, Bld: 471 mg/dL — ABNORMAL HIGH (ref 70–99)
Potassium: 3.2 mEq/L — ABNORMAL LOW (ref 3.5–5.1)

## 2010-06-27 LAB — POCT CARDIAC MARKERS: CKMB, poc: 1.3 ng/mL (ref 1.0–8.0)

## 2010-06-27 LAB — GLUCOSE, CAPILLARY: Glucose-Capillary: 432 mg/dL — ABNORMAL HIGH (ref 70–99)

## 2010-06-27 LAB — CBC
HCT: 38.5 % (ref 36.0–46.0)
MCHC: 33.6 g/dL (ref 30.0–36.0)
MCV: 89.7 fL (ref 78.0–100.0)
Platelets: 258 10*3/uL (ref 150–400)
RDW: 16 % — ABNORMAL HIGH (ref 11.5–15.5)
WBC: 9.7 10*3/uL (ref 4.0–10.5)

## 2010-06-27 LAB — PROTIME-INR
INR: 2.7 — ABNORMAL HIGH (ref 0.00–1.49)
Prothrombin Time: 28.5 seconds — ABNORMAL HIGH (ref 11.6–15.2)

## 2010-07-01 LAB — COMPREHENSIVE METABOLIC PANEL
ALT: 27 U/L (ref 0–35)
AST: 21 U/L (ref 0–37)
Albumin: 3.5 g/dL (ref 3.5–5.2)
Alkaline Phosphatase: 78 U/L (ref 39–117)
BUN: 25 mg/dL — ABNORMAL HIGH (ref 6–23)
CO2: 29 mEq/L (ref 19–32)
Calcium: 9 mg/dL (ref 8.4–10.5)
Chloride: 103 mEq/L (ref 96–112)
Creatinine, Ser: 1.38 mg/dL — ABNORMAL HIGH (ref 0.4–1.2)
GFR calc Af Amer: 47 mL/min — ABNORMAL LOW (ref >60)
GFR calc non Af Amer: 39 mL/min — ABNORMAL LOW (ref >60)
Glucose, Bld: 113 mg/dL — ABNORMAL HIGH (ref 70–99)
Potassium: 4.2 mEq/L (ref 3.5–5.1)
Sodium: 140 mEq/L (ref 135–145)
Total Bilirubin: 0.3 mg/dL (ref 0.3–1.2)
Total Protein: 7 g/dL (ref 6.0–8.3)

## 2010-07-01 LAB — CBC
HCT: 30.7 % — ABNORMAL LOW (ref 36.0–46.0)
HCT: 25.7 % — ABNORMAL LOW (ref 36.0–46.0)
HCT: 33.2 % — ABNORMAL LOW (ref 36.0–46.0)
Hemoglobin: 10.2 g/dL — ABNORMAL LOW (ref 12.0–15.0)
Hemoglobin: 11.1 g/dL — ABNORMAL LOW (ref 12.0–15.0)
MCHC: 33.3 g/dL (ref 30.0–36.0)
MCHC: 34.1 g/dL (ref 30.0–36.0)
MCV: 91.4 fL (ref 78.0–100.0)
MCV: 88.5 fL (ref 78.0–100.0)
MCV: 90.2 fL (ref 78.0–100.0)
MCV: 91 fL (ref 78.0–100.0)
Platelets: 230 10*3/uL (ref 150–400)
Platelets: 219 10*3/uL (ref 150–400)
Platelets: 308 10*3/uL (ref 150–400)
RBC: 3.36 MIL/uL — ABNORMAL LOW (ref 3.87–5.11)
RBC: 2.85 MIL/uL — ABNORMAL LOW (ref 3.87–5.11)
RBC: 3.65 MIL/uL — ABNORMAL LOW (ref 3.87–5.11)
RDW: 16 % — ABNORMAL HIGH (ref 11.5–15.5)
WBC: 11.9 10*3/uL — ABNORMAL HIGH (ref 4.0–10.5)
WBC: 11.8 10*3/uL — ABNORMAL HIGH (ref 4.0–10.5)
WBC: 7.3 10*3/uL (ref 4.0–10.5)
WBC: 8.6 10*3/uL (ref 4.0–10.5)

## 2010-07-01 LAB — URINALYSIS, ROUTINE W REFLEX MICROSCOPIC
Glucose, UA: NEGATIVE mg/dL
Hgb urine dipstick: NEGATIVE
Ketones, ur: NEGATIVE mg/dL
Nitrite: NEGATIVE
Protein, ur: NEGATIVE mg/dL
Specific Gravity, Urine: 1.025 (ref 1.005–1.030)
Urobilinogen, UA: 0.2 mg/dL (ref 0.0–1.0)
pH: 6 (ref 5.0–8.0)

## 2010-07-01 LAB — DIFFERENTIAL
Basophils Absolute: 0.1 10*3/uL (ref 0.0–0.1)
Basophils Relative: 2 % — ABNORMAL HIGH (ref 0–1)
Eosinophils Absolute: 0.1 10*3/uL (ref 0.0–0.7)
Eosinophils Relative: 2 % (ref 0–5)
Lymphocytes Relative: 35 % (ref 12–46)
Lymphs Abs: 2.6 10*3/uL (ref 0.7–4.0)
Monocytes Absolute: 0.5 10*3/uL (ref 0.1–1.0)
Monocytes Relative: 7 % (ref 3–12)
Neutro Abs: 4 10*3/uL (ref 1.7–7.7)
Neutrophils Relative %: 54 % (ref 43–77)

## 2010-07-01 LAB — URINALYSIS, MICROSCOPIC ONLY
Ketones, ur: NEGATIVE mg/dL
Nitrite: NEGATIVE
Protein, ur: 30 mg/dL — AB

## 2010-07-01 LAB — BASIC METABOLIC PANEL
BUN: 11 mg/dL (ref 6–23)
BUN: 18 mg/dL (ref 6–23)
CO2: 29 mEq/L (ref 19–32)
Calcium: 7.8 mg/dL — ABNORMAL LOW (ref 8.4–10.5)
Chloride: 94 mEq/L — ABNORMAL LOW (ref 96–112)
Chloride: 101 mEq/L (ref 96–112)
Chloride: 98 mEq/L (ref 96–112)
Creatinine, Ser: 1.22 mg/dL — ABNORMAL HIGH (ref 0.4–1.2)
GFR calc Af Amer: 54 mL/min — ABNORMAL LOW (ref >60)
GFR calc Af Amer: 31 mL/min — ABNORMAL LOW (ref >60)
GFR calc non Af Amer: 45 mL/min — ABNORMAL LOW (ref >60)
Glucose, Bld: 199 mg/dL — ABNORMAL HIGH (ref 70–99)
Potassium: 3.5 mEq/L (ref 3.5–5.1)
Potassium: 4.2 mEq/L (ref 3.5–5.1)
Potassium: 4.3 mEq/L (ref 3.5–5.1)
Sodium: 134 mEq/L — ABNORMAL LOW (ref 135–145)

## 2010-07-01 LAB — TYPE AND SCREEN

## 2010-07-01 LAB — URINE CULTURE: Colony Count: 8000

## 2010-07-01 LAB — GLUCOSE, CAPILLARY
Glucose-Capillary: 220 mg/dL — ABNORMAL HIGH (ref 70–99)
Glucose-Capillary: 265 mg/dL — ABNORMAL HIGH (ref 70–99)
Glucose-Capillary: 142 mg/dL — ABNORMAL HIGH (ref 70–99)
Glucose-Capillary: 153 mg/dL — ABNORMAL HIGH (ref 70–99)
Glucose-Capillary: 157 mg/dL — ABNORMAL HIGH (ref 70–99)
Glucose-Capillary: 161 mg/dL — ABNORMAL HIGH (ref 70–99)
Glucose-Capillary: 179 mg/dL — ABNORMAL HIGH (ref 70–99)
Glucose-Capillary: 201 mg/dL — ABNORMAL HIGH (ref 70–99)
Glucose-Capillary: 223 mg/dL — ABNORMAL HIGH (ref 70–99)

## 2010-07-01 LAB — PROTIME-INR
INR: 1.1 (ref 0.00–1.49)
INR: 1.8 — ABNORMAL HIGH (ref 0.00–1.49)
Prothrombin Time: 20.3 seconds — ABNORMAL HIGH (ref 11.6–15.2)
Prothrombin Time: 20.6 seconds — ABNORMAL HIGH (ref 11.6–15.2)

## 2010-07-01 LAB — PREPARE RBC (CROSSMATCH)

## 2010-07-01 LAB — ABO/RH: ABO/RH(D): A POS

## 2010-07-01 LAB — TRANSFUSION REACTION: Post RXN DAT IgG: NEGATIVE

## 2010-07-03 LAB — CBC
HCT: 33.5 % — ABNORMAL LOW (ref 36.0–46.0)
Hemoglobin: 10.9 g/dL — ABNORMAL LOW (ref 12.0–15.0)
MCHC: 32.7 g/dL (ref 30.0–36.0)
RBC: 3.73 MIL/uL — ABNORMAL LOW (ref 3.87–5.11)

## 2010-07-03 LAB — COMPREHENSIVE METABOLIC PANEL
ALT: 18 U/L (ref 0–35)
Alkaline Phosphatase: 56 U/L (ref 39–117)
CO2: 25 mEq/L (ref 19–32)
GFR calc non Af Amer: 43 mL/min — ABNORMAL LOW (ref >60)
Glucose, Bld: 124 mg/dL — ABNORMAL HIGH (ref 70–99)
Potassium: 3.1 mEq/L — ABNORMAL LOW (ref 3.5–5.1)
Sodium: 143 mEq/L (ref 135–145)

## 2010-07-03 LAB — PROTIME-INR
INR: 2.8 — ABNORMAL HIGH (ref 0.00–1.49)
Prothrombin Time: 31.9 seconds — ABNORMAL HIGH (ref 11.6–15.2)

## 2010-07-03 LAB — URINALYSIS, ROUTINE W REFLEX MICROSCOPIC
Bilirubin Urine: NEGATIVE
Hgb urine dipstick: NEGATIVE
Ketones, ur: NEGATIVE mg/dL
Protein, ur: NEGATIVE mg/dL
Urobilinogen, UA: 0.2 mg/dL (ref 0.0–1.0)

## 2010-07-03 LAB — DIFFERENTIAL
Basophils Absolute: 0 10*3/uL (ref 0.0–0.1)
Basophils Relative: 0 % (ref 0–1)
Eosinophils Absolute: 0 10*3/uL (ref 0.0–0.7)
Neutrophils Relative %: 74 % (ref 43–77)

## 2010-07-03 LAB — GLUCOSE, CAPILLARY: Glucose-Capillary: 130 mg/dL — ABNORMAL HIGH (ref 70–99)

## 2010-07-05 LAB — CBC
HCT: 26.7 % — ABNORMAL LOW (ref 36.0–46.0)
HCT: 28.9 % — ABNORMAL LOW (ref 36.0–46.0)
HCT: 33.2 % — ABNORMAL LOW (ref 36.0–46.0)
Hemoglobin: 9.1 g/dL — ABNORMAL LOW (ref 12.0–15.0)
MCHC: 33.9 g/dL (ref 30.0–36.0)
MCHC: 33.6 g/dL (ref 30.0–36.0)
MCHC: 33.9 g/dL (ref 30.0–36.0)
MCHC: 34.8 g/dL (ref 30.0–36.0)
MCV: 90 fL (ref 78.0–100.0)
MCV: 89.6 fL (ref 78.0–100.0)
MCV: 90.1 fL (ref 78.0–100.0)
MCV: 90.2 fL (ref 78.0–100.0)
MCV: 90.6 fL (ref 78.0–100.0)
MCV: 91.3 fL (ref 78.0–100.0)
Platelets: 261 10*3/uL (ref 150–400)
Platelets: 322 10*3/uL (ref 150–400)
Platelets: 276 10*3/uL (ref 150–400)
Platelets: 300 10*3/uL (ref 150–400)
Platelets: 339 10*3/uL (ref 150–400)
RBC: 2.87 MIL/uL — ABNORMAL LOW (ref 3.87–5.11)
RBC: 2.95 MIL/uL — ABNORMAL LOW (ref 3.87–5.11)
RBC: 3.04 MIL/uL — ABNORMAL LOW (ref 3.87–5.11)
RBC: 3.21 MIL/uL — ABNORMAL LOW (ref 3.87–5.11)
RBC: 3.48 MIL/uL — ABNORMAL LOW (ref 3.87–5.11)
RDW: 14.8 % (ref 11.5–15.5)
RDW: 14.3 % (ref 11.5–15.5)
RDW: 15.2 % (ref 11.5–15.5)
RDW: 15.3 % (ref 11.5–15.5)
WBC: 7.1 10*3/uL (ref 4.0–10.5)
WBC: 6.3 10*3/uL (ref 4.0–10.5)
WBC: 6.6 10*3/uL (ref 4.0–10.5)
WBC: 6.7 10*3/uL (ref 4.0–10.5)
WBC: 8.2 10*3/uL (ref 4.0–10.5)
WBC: 8.9 10*3/uL (ref 4.0–10.5)

## 2010-07-05 LAB — DIFFERENTIAL
Basophils Absolute: 0 10*3/uL (ref 0.0–0.1)
Basophils Relative: 1 % (ref 0–1)
Eosinophils Absolute: 0.1 10*3/uL (ref 0.0–0.7)
Eosinophils Relative: 2 % (ref 0–5)
Lymphocytes Relative: 29 % (ref 12–46)
Lymphocytes Relative: 35 % (ref 12–46)
Lymphs Abs: 2 10*3/uL (ref 0.7–4.0)
Neutro Abs: 3.9 10*3/uL (ref 1.7–7.7)
Neutrophils Relative %: 61 % (ref 43–77)

## 2010-07-05 LAB — PROTIME-INR
INR: 1.3 (ref 0.00–1.49)
INR: 2.8 — ABNORMAL HIGH (ref 0.00–1.49)
INR: >9.8 (ref 0.00–1.49)
INR: 1.7 — ABNORMAL HIGH (ref 0.00–1.49)
INR: 1.7 — ABNORMAL HIGH (ref 0.00–1.49)
INR: 1.7 — ABNORMAL HIGH (ref 0.00–1.49)
INR: 2 — ABNORMAL HIGH (ref 0.00–1.49)
INR: 3.1 — ABNORMAL HIGH (ref 0.00–1.49)
INR: 3.6 — ABNORMAL HIGH (ref 0.00–1.49)
Prothrombin Time: 22.2 seconds — ABNORMAL HIGH (ref 11.6–15.2)
Prothrombin Time: >90 seconds — ABNORMAL HIGH (ref 11.6–15.2)
Prothrombin Time: 20.7 seconds — ABNORMAL HIGH (ref 11.6–15.2)
Prothrombin Time: 23.3 seconds — ABNORMAL HIGH (ref 11.6–15.2)
Prothrombin Time: 23.8 seconds — ABNORMAL HIGH (ref 11.6–15.2)
Prothrombin Time: 24 seconds — ABNORMAL HIGH (ref 11.6–15.2)
Prothrombin Time: 31.2 seconds — ABNORMAL HIGH (ref 11.6–15.2)
Prothrombin Time: 34.2 seconds — ABNORMAL HIGH (ref 11.6–15.2)
Prothrombin Time: 39 seconds — ABNORMAL HIGH (ref 11.6–15.2)

## 2010-07-05 LAB — GLUCOSE, CAPILLARY
Glucose-Capillary: 211 mg/dL — ABNORMAL HIGH (ref 70–99)
Glucose-Capillary: 230 mg/dL — ABNORMAL HIGH (ref 70–99)
Glucose-Capillary: 236 mg/dL — ABNORMAL HIGH (ref 70–99)
Glucose-Capillary: 147 mg/dL — ABNORMAL HIGH (ref 70–99)
Glucose-Capillary: 173 mg/dL — ABNORMAL HIGH (ref 70–99)
Glucose-Capillary: 181 mg/dL — ABNORMAL HIGH (ref 70–99)
Glucose-Capillary: 183 mg/dL — ABNORMAL HIGH (ref 70–99)
Glucose-Capillary: 198 mg/dL — ABNORMAL HIGH (ref 70–99)
Glucose-Capillary: 214 mg/dL — ABNORMAL HIGH (ref 70–99)
Glucose-Capillary: 219 mg/dL — ABNORMAL HIGH (ref 70–99)
Glucose-Capillary: 220 mg/dL — ABNORMAL HIGH (ref 70–99)
Glucose-Capillary: 224 mg/dL — ABNORMAL HIGH (ref 70–99)
Glucose-Capillary: 226 mg/dL — ABNORMAL HIGH (ref 70–99)
Glucose-Capillary: 229 mg/dL — ABNORMAL HIGH (ref 70–99)
Glucose-Capillary: 232 mg/dL — ABNORMAL HIGH (ref 70–99)
Glucose-Capillary: 250 mg/dL — ABNORMAL HIGH (ref 70–99)
Glucose-Capillary: 253 mg/dL — ABNORMAL HIGH (ref 70–99)
Glucose-Capillary: 289 mg/dL — ABNORMAL HIGH (ref 70–99)
Glucose-Capillary: 289 mg/dL — ABNORMAL HIGH (ref 70–99)
Glucose-Capillary: 290 mg/dL — ABNORMAL HIGH (ref 70–99)

## 2010-07-05 LAB — BASIC METABOLIC PANEL
BUN: 27 mg/dL — ABNORMAL HIGH (ref 6–23)
BUN: 42 mg/dL — ABNORMAL HIGH (ref 6–23)
BUN: 51 mg/dL — ABNORMAL HIGH (ref 6–23)
BUN: 24 mg/dL — ABNORMAL HIGH (ref 6–23)
BUN: 25 mg/dL — ABNORMAL HIGH (ref 6–23)
CO2: 29 mEq/L (ref 19–32)
Calcium: 8.2 mg/dL — ABNORMAL LOW (ref 8.4–10.5)
Calcium: 8.5 mg/dL (ref 8.4–10.5)
Calcium: 8.8 mg/dL (ref 8.4–10.5)
Chloride: 100 mEq/L (ref 96–112)
Chloride: 106 mEq/L (ref 96–112)
Creatinine, Ser: 3.08 mg/dL — ABNORMAL HIGH (ref 0.4–1.2)
Creatinine, Ser: 1.29 mg/dL — ABNORMAL HIGH (ref 0.4–1.2)
Creatinine, Ser: 1.5 mg/dL — ABNORMAL HIGH (ref 0.4–1.2)
Creatinine, Ser: 1.65 mg/dL — ABNORMAL HIGH (ref 0.4–1.2)
GFR calc Af Amer: 43 mL/min — ABNORMAL LOW (ref >60)
GFR calc Af Amer: 51 mL/min — ABNORMAL LOW (ref >60)
GFR calc non Af Amer: 15 mL/min — ABNORMAL LOW (ref >60)
GFR calc non Af Amer: 26 mL/min — ABNORMAL LOW (ref >60)
GFR calc non Af Amer: 45 mL/min — ABNORMAL LOW (ref >60)
GFR calc non Af Amer: 27 mL/min — ABNORMAL LOW (ref >60)
GFR calc non Af Amer: 32 mL/min — ABNORMAL LOW (ref >60)
GFR calc non Af Amer: 42 mL/min — ABNORMAL LOW (ref >60)
Glucose, Bld: 250 mg/dL — ABNORMAL HIGH (ref 70–99)
Glucose, Bld: 253 mg/dL — ABNORMAL HIGH (ref 70–99)
Glucose, Bld: 211 mg/dL — ABNORMAL HIGH (ref 70–99)
Potassium: 4 mEq/L (ref 3.5–5.1)
Potassium: 3.8 mEq/L (ref 3.5–5.1)
Potassium: 3.8 mEq/L (ref 3.5–5.1)
Potassium: 4.2 mEq/L (ref 3.5–5.1)

## 2010-07-05 LAB — URINALYSIS, MICROSCOPIC ONLY
Glucose, UA: NEGATIVE mg/dL
Hgb urine dipstick: NEGATIVE
Specific Gravity, Urine: 1.019 (ref 1.005–1.030)
Urobilinogen, UA: 0.2 mg/dL (ref 0.0–1.0)
pH: 5.5 (ref 5.0–8.0)

## 2010-07-05 LAB — CREATININE, URINE, RANDOM: Creatinine, Urine: 130 mg/dL

## 2010-07-05 LAB — HEPARIN LEVEL (UNFRACTIONATED)
Heparin Unfractionated: 0.23 IU/mL — ABNORMAL LOW (ref 0.30–0.70)
Heparin Unfractionated: 0.36 IU/mL (ref 0.30–0.70)
Heparin Unfractionated: 0.57 IU/mL (ref 0.30–0.70)
Heparin Unfractionated: <0.1 IU/mL — ABNORMAL LOW (ref 0.30–0.70)

## 2010-07-05 LAB — FOLATE RBC: RBC Folate: 1315 ng/mL — ABNORMAL HIGH (ref 180–600)

## 2010-07-05 LAB — HEPATIC FUNCTION PANEL
AST: 42 U/L — ABNORMAL HIGH (ref 0–37)
Bilirubin, Direct: 0.1 mg/dL (ref 0.0–0.3)

## 2010-07-05 LAB — FERRITIN: Ferritin: 56 ng/mL (ref 10–291)

## 2010-07-05 LAB — URINE CULTURE

## 2010-07-05 LAB — HEMOGLOBIN A1C
Hgb A1c MFr Bld: 8 % — ABNORMAL HIGH (ref 4.6–6.1)
Mean Plasma Glucose: 183 mg/dL

## 2010-07-05 LAB — BRAIN NATRIURETIC PEPTIDE: Pro B Natriuretic peptide (BNP): 236 pg/mL — ABNORMAL HIGH (ref 0.0–100.0)

## 2010-07-05 LAB — OSMOLALITY, URINE: Osmolality, Ur: 386 mOsm/kg — ABNORMAL LOW (ref 390–1090)

## 2010-07-05 LAB — HEMOGLOBIN AND HEMATOCRIT, BLOOD: Hemoglobin: 10 g/dL — ABNORMAL LOW (ref 12.0–15.0)

## 2010-07-06 LAB — BASIC METABOLIC PANEL
BUN: 36 mg/dL — ABNORMAL HIGH (ref 6–23)
CO2: 29 mEq/L (ref 19–32)
Chloride: 98 mEq/L (ref 96–112)
Creatinine, Ser: 1.72 mg/dL — ABNORMAL HIGH (ref 0.4–1.2)
GFR calc Af Amer: 32 mL/min — ABNORMAL LOW (ref >60)
GFR calc non Af Amer: 30 mL/min — ABNORMAL LOW (ref >60)
Glucose, Bld: 245 mg/dL — ABNORMAL HIGH (ref 70–99)
Potassium: 4.1 mEq/L (ref 3.5–5.1)

## 2010-07-06 LAB — PROTIME-INR
INR: 6.9 (ref 0.00–1.49)
Prothrombin Time: 47.3 seconds — ABNORMAL HIGH (ref 11.6–15.2)
Prothrombin Time: 67.2 seconds — ABNORMAL HIGH (ref 11.6–15.2)

## 2010-07-06 LAB — CBC
HCT: 31.5 % — ABNORMAL LOW (ref 36.0–46.0)
MCHC: 33.5 g/dL (ref 30.0–36.0)
MCV: 90.9 fL (ref 78.0–100.0)
MCV: 91.1 fL (ref 78.0–100.0)
Platelets: 287 10*3/uL (ref 150–400)
RBC: 3.46 MIL/uL — ABNORMAL LOW (ref 3.87–5.11)
RDW: 14.6 % (ref 11.5–15.5)
WBC: 6.3 10*3/uL (ref 4.0–10.5)
WBC: 7.4 10*3/uL (ref 4.0–10.5)

## 2010-07-06 LAB — GLUCOSE, CAPILLARY
Glucose-Capillary: 159 mg/dL — ABNORMAL HIGH (ref 70–99)
Glucose-Capillary: 186 mg/dL — ABNORMAL HIGH (ref 70–99)
Glucose-Capillary: 202 mg/dL — ABNORMAL HIGH (ref 70–99)
Glucose-Capillary: 203 mg/dL — ABNORMAL HIGH (ref 70–99)
Glucose-Capillary: 220 mg/dL — ABNORMAL HIGH (ref 70–99)
Glucose-Capillary: 222 mg/dL — ABNORMAL HIGH (ref 70–99)
Glucose-Capillary: 321 mg/dL — ABNORMAL HIGH (ref 70–99)

## 2010-07-06 LAB — TSH: TSH: 1.883 u[IU]/mL (ref 0.350–4.500)

## 2010-08-09 ENCOUNTER — Encounter: Payer: Self-pay | Admitting: Infectious Disease

## 2010-08-09 ENCOUNTER — Ambulatory Visit (INDEPENDENT_AMBULATORY_CARE_PROVIDER_SITE_OTHER): Payer: Federal, State, Local not specified - PPO | Admitting: Infectious Disease

## 2010-08-09 VITALS — BP 121/85 | HR 104 | Temp 98.2°F | Ht 64.0 in | Wt 249.0 lb

## 2010-08-09 DIAGNOSIS — A4101 Sepsis due to Methicillin susceptible Staphylococcus aureus: Secondary | ICD-10-CM

## 2010-08-09 DIAGNOSIS — M009 Pyogenic arthritis, unspecified: Secondary | ICD-10-CM

## 2010-08-09 LAB — CBC WITH DIFFERENTIAL/PLATELET
Basophils Absolute: 0 10*3/uL (ref 0.0–0.1)
Eosinophils Relative: 1 % (ref 0–5)
HCT: 38.7 % (ref 36.0–46.0)
Hemoglobin: 12.7 g/dL (ref 12.0–15.0)
Lymphocytes Relative: 34 % (ref 12–46)
MCV: 89.8 fL (ref 78.0–100.0)
Monocytes Absolute: 0.4 10*3/uL (ref 0.1–1.0)
Monocytes Relative: 6 % (ref 3–12)
Neutro Abs: 3.8 10*3/uL (ref 1.7–7.7)
RDW: 16.4 % — ABNORMAL HIGH (ref 11.5–15.5)
WBC: 6.5 10*3/uL (ref 4.0–10.5)

## 2010-08-09 LAB — BASIC METABOLIC PANEL
BUN: 27 mg/dL — ABNORMAL HIGH (ref 6–23)
CO2: 23 mEq/L (ref 19–32)
Chloride: 98 mEq/L (ref 96–112)
Glucose, Bld: 323 mg/dL — ABNORMAL HIGH (ref 70–99)
Potassium: 3.6 mEq/L (ref 3.5–5.3)
Sodium: 139 mEq/L (ref 135–145)

## 2010-08-09 MED ORDER — MUPIROCIN CALCIUM 2 % EX CREA: TOPICAL_CREAM | CUTANEOUS | Status: DC

## 2010-08-09 MED ORDER — CHLORHEXIDINE GLUCONATE 4 % EX LIQD: Freq: Every day | CUTANEOUS | Status: AC

## 2010-08-09 NOTE — Op Note (Signed)
NAME:  [name]                  ACCOUNT NO.:  [id number]   MEDICAL RECORD NO.:  [id number]          PATIENT TYPE:  AMB   LOCATION:  ENDO                         FACILITY:  [facility]   PHYSICIAN:  Jake Bathe, MD      DATE OF BIRTH:  [birth year]   DATE OF PROCEDURE:  06/03/2008  DATE OF DISCHARGE:                               OPERATIVE REPORT   INDICATIONS:  A 65 year old female with prior history of pulmonary  embolism, on Coumadin therapy, with last INR of 3.0 with persistent  atrial flutter, difficult to rate control, and normal ejection fraction.   PROCEDURE DETAILS:  Informed consent was obtained.  Risk of stroke,  heart attack, and death were explained to the patient at length.  Transesophageal echocardiogram was performed which demonstrated no  evidence of left atrial thrombus and only trace mitral regurgitation  with normal ejection fraction.  Dr. Gypsy Balsam with Anesthesiology assisted  with sedation.  A single biphasic 100 joules shock was administered to  the chest wall and successfully cardioverted her to sinus bradycardia,  rate 59 with occasional PACs.   FINDINGS:  Successful DC cardioversion from atrial flutter to sinus  bradycardia.   PLAN:  Continue current medications including Toprol.  We will see back  in clinic in 1 week, if still maintaining sinus rhythm, we will then  proceed to refer to Orthopedic Surgery for knee replacement.  If she  does go back into atrial flutter, we will consider flutter ablation at  that time.      Jake Bathe, MD  Electronically Signed     MCS/MEDQ  D:  06/03/2008  T:  06/04/2008  Job:  [id number]   cc:   Deirdre Peer. Polite, M.D.

## 2010-08-09 NOTE — H&P (Signed)
NAME:  [name] [name], [name] [name]                  ACCOUNT NO.:  [id number]   MEDICAL RECORD NO.:  [id number]          PATIENT TYPE:  INP   LOCATION:  3701                         FACILITY:  [facility]   PHYSICIAN:  Jake Bathe, MD      DATE OF BIRTH:  1945-11-26   DATE OF ADMISSION:  07/27/2008  DATE OF DISCHARGE:                              HISTORY & PHYSICAL   PRIMARY CARE PHYSICIAN:  Deirdre Peer. Polite, MD   CHIEF COMPLAINT:  Atrial fibrillation, rapid ventricular  response/shortness of breath.   HISTORY OF PRESENT ILLNESS:  A 65 year old African American female with  hypertension, hyperlipidemia, diabetes, obesity, prior history of DVT  and pulmonary embolism on chronic Coumadin therapy for several years,  who has recently suffered with recurrent bouts of atrial  fibrillation/flutter requiring DC cardioversion.  Originally, her atrial  flutter was discovered during an echocardiogram in October 2009.  She  was then referred for a preoperative consultation for prospective knee  surgery by Dr. Nehemiah Settle in March 2010, at which point, she was noted once  again to be in atrial flutter at a heart rate of approximately 130.  At  that point, transesophageal cardioversion was scheduled and performed  successfully.  She did quite well for some time on only Toprol therapy.  She then went back into atrial flutter/fibrillation after being  discovered on a life watch monitor.  The monitor was placed because she  was having bouts of paroxysmal dyspnea and my suspicion was that she was  having atrial arrhythmias.  After atrial fibrillation with rapid  ventricular response was once again discovered, she was placed on  Rythmol extended release therapy 325 mg twice a day for approximately 4  days, then brought back in to the hospital for elective cardioversion,  which was performed in the cardiac catheterization lab.  This was  successful after one shock.  She then went home the next day, feeling  better.  Her life  watch monitor was continued.  This was on July 21, 2008.  Unfortunately, today, she once again felt shortness of breath and  her life watch monitor picked up atrial fibrillation at a rapid  ventricular response rate of 170 beats per minute.  She was notified and  admitted directly to telemetry floor.   Once on telemetry floor, she was complaining of mild shortness of  breath.  Her daughter was with her.  She denied any chest pain, fevers,  chills, nausea, or vomiting.  Her prior hospitalization consisted of  TSH, which was normal at 1.8.  CBC which demonstrated mild anemia with a  hemoglobin of 9.3, hematocrit of 27.9, and a creatinine of 1.7, mildly  elevated.   She is being admitted for control of atrial fibrillation and possible  repeat cardioversion versus change in pharmacotherapy.   PAST MEDICAL HISTORY:  As described above in HPI.   ALLERGIES:  No known drug allergies.   MEDICATIONS:  1. Metformin 1000 mg twice a day.  2. Lasix 40 mg a day.  3. Potassium 20 mEq a day.  4. Coumadin 5 mg a  day.  5. Oxycodone p.r.n.  6. Promethazine p.r.n.  7. Zocor 40 mg a day.  8. Gabapentin 600 mg 3 times a day.  9. Aciphex 20 mg as needed.  10.Lexapro 10 mg a day.  11.Metoprolol 25 mg twice a day.  12.Etodolac 400 mg twice a day.  13.Rythmol extended release 325 mg twice a day.  14.Cardizem 240 mg a day.   SOCIAL HISTORY:  She does not smoke, drink, or use any illicit drugs.   FAMILY HISTORY:  No prior history of early coronary artery disease or  clotting disorders.   REVIEW OF SYSTEMS:  Unless explained above, all other 12 review of  systems negative.   PHYSICAL EXAMINATION:  VITAL SIGNS:  Temperature 98.4, pulse 160 and  irregular, respirations 18, blood pressure 143/75, sating 100% on room  air.  GENERAL:  Alert and oriented x3 in no acute distress, comfortable.  EYES:  Well-perfused conjunctivae.  EOMI.  No scleral icterus.  NECK:  Supple, thick.  No carotid bruits.  No  JVD.  CARDIOVASCULAR:  Irregularly irregular rhythm, tachycardiac.  No  appreciable murmurs, rubs, or gallops.  Normal PMI.  LUNGS:  Clear to auscultation bilaterally.  No appreciable wheezes.  No  rales.  ABDOMEN:  Obese, positive bowel sounds, nontender.  No bruits.  EXTREMITIES:  Trace edema bilateral lower extremities.  Normal distal  pulses.  NEUROLOGIC:  Nonfocal.  Normal gait.  PSYCH:  Normal affect, slightly frustrated with repeat admission.   LABORATORY DATA:  Prior labs as described above in HPI.  Most notable,  creatinine 1.7.  TSH normal.  CBC with mild anemia.  Prior CT angiogram  of chest in January 21, 2008, demonstrated central pulmonary emboli.  Prior nuclear stress test earlier this year demonstrated no evidence of  ischemia.  Prior medical records reviewed.  Echocardiogram from October  2009 demonstrated normal ejection fraction.  TEE on June 03, 2008,  showed no evidence of thrombus.   ASSESSMENT AND PLAN:  A 65 year old female with atrial  fibrillation/flutter, rapid ventricular response, obesity, hypertension,  hyperlipidemia, diabetes.  1. Atrial fibrillation - at this point, I will place on Cardizem      intravenous drip for further rate control.  Continue metoprolol,      Coumadin.  I will hold Rythmol at this point with the possibility      of transitioning to a separate antiarrhythmic agents such as      Tikosyn  or amiodarone.  We will consult Electrophysiology for      ideas.  2. Hypertension - currently mildly elevated, monitor.  3. Prior pulmonary embolism - continue Coumadin therapy.  4. Obesity - continue to encourage diet and exercise.  5. Dyspnea - secondary to diastolic dysfunction and rapid atrial      fibrillation.  Continue with rate control at this point.      Jake Bathe, MD  Electronically Signed     MCS/MEDQ  D:  07/27/2008  T:  07/28/2008  Job:  [id number]   cc:   Deirdre Peer. Polite, M.D.

## 2010-08-09 NOTE — Assessment & Plan Note (Signed)
I think this is likely resolved. I would like to check a sedimentation rate C-reactive protein to make sure these are trending down further than the last check in February. These are encouraging although I did give the go ahead to Dr. Shon Hough to proceed with breast r reduction surgery.

## 2010-08-09 NOTE — Consult Note (Signed)
NAME:  [name] [name], [name] [name]                  ACCOUNT NO.:  [id number]   MEDICAL RECORD NO.:  [id number]          [name] TYPE:  INP   LOCATION:  3701                         FACILITY:  [facility]   PHYSICIAN:  Hillis Range, MD       DATE OF BIRTH:  01-02-1946   DATE OF CONSULTATION:  DATE OF DISCHARGE:                                 CONSULTATION   REQUESTING PHYSICIAN:  Mark C. Skains, MD   REASON FOR CONSULTATION:  Atrial fibrillation and atrial flutter.   HISTORY OF PRESENT ILLNESS:  [name] [name] is a pleasant 65 year old African  American female with a history of morbid obesity, diabetes, and  hypertension who was admitted with atrial fibrillation and rapidly  conducting atrial flutter.  [name] [name] reports initially being  diagnosed with atrial fibrillation and atrial flutter in March 2010.  She appears to have presented with atrial flutter and underwent  cardioversion on June 03, 2008 to sinus rhythm.  She maintained sinus  rhythm for only several days.  She was therefore initiated on Rythmol  and returned for repeat cardioversion on July 21, 2008.  At that time,  she appeared to again present with atrial flutter and was successfully  cardioverted.  [name] [name] had a LifeWatch monitor placed which has  subsequently documented predominantly atrial flutter with 2:1 AV  conduction and ventricular rates in [name] 150s.  She has also had episodes  of atrial fibrillation.  [name] [name] reports that during episodes of  rapid ventricular rates that she developed significant shortness of  breath, palpitations, and fatigue.  She has previously been treated with  diltiazem and metoprolol for rate control without good success.  She is  chronically anticoagulated with Coumadin for a prior history of multiple  pulmonary emboli.  Presently, she has been placed on a diltiazem drip  and is conducting with 4:1 conduction with an average ventricular rate  of 70 beats per minute.  She reports that her shortness of  breath,  palpitations, and fatigue have resolved.  She is otherwise without  complaint at this time.   PAST MEDICAL HISTORY:  1. Atrial fibrillation and atrial flutter (as above).  2. Recurrent pulmonary emboli  3. Morbid obesity.  4. Hypertension.  5. Diabetes.  6. Arthritis.   ALLERGIES:  SHELLFISH causes vomiting.   CURRENT MEDICATIONS:  1. Coumadin to maintain an INR between 2 and 3.  2. Lasix 40 mg daily.  3. Potassium chloride 20 mEq daily.  4. Simvastatin 40 mg nightly.  5. Protonix 40 mg daily.  6. Metoprolol 50 mg b.i.d.  7. Diltiazem drip.   SOCIAL HISTORY:  [name] [name] lives in [place] and previously worked  for [name].  She denies alcohol or drug use and has a remote  history of tobacco.   FAMILY HISTORY:  Notable for [name] [name]'s maternal grandmother died of  a blood clot.   REVIEW OF SYSTEMS:  All systems were reviewed and are negative except as  outlined in [name] HPI above.  [name] [name] also reports frequent episodes  of nausea for which she chronically takes  Phenergan as needed.   PHYSICAL EXAMINATION:  Telemetry reveals atrial flutter with 4:1 AV  conduction.  VITAL SIGNS:  Blood pressure 116/74, heart rate 66, respirations 18,  saturations 99% on room air, afebrile.  Weight 107 kg.  GENERAL:  [name] [name] is a morbidly obese female in no acute distress.  She is alert and oriented x3.  HEENT:  Normocephalic, atraumatic.  Sclerae clear.  Conjunctivae pink.  Oropharynx clear.  NECK:  Supple.  No thyromegaly, JVD, or bruits.  LUNGS:  Clear to auscultation bilaterally.  HEART:  Regular rate and rhythm.  No murmurs, rubs, or gallops.  GI:  Soft, nontender, and nondistended.  Positive bowel sounds, morbidly  obese.  EXTREMITIES:  No clubbing, cyanosis.  Trace lower extremity edema.  NEUROLOGIC:  Cranial nerves II through XII are intact.  Strength and  sensation are intact.  SKIN:  No ecchymoses or lacerations.  MUSCULOSKELETAL:  No deformity  or atrophy.  PSYCHIATRY:  Euthymic mood.  Full affect.   LABORATORY VALUES:  Creatinine 1.6, INR 3.6.  Hematocrit 31, white blood  cell count 8.9, platelets 339.  BNP 236.  TSH 1.88.   Transthoracic echocardiogram from July 23, 2007 reveals a left  ventricular ejection fraction of 55% with a left ventricular end-  diastolic dimension of 40 and a left atrial size of 47 mm.  A question  was raised for possible ASD or PFO.  Transesophageal echocardiogram on  June 03, 2008 reveals no evidence of thrombus with a left ventricular  ejection fraction of 60% with a normal left atrial size.  There was no  mention of ASD or PFO and [name] interatrial septum is commented as normal.  There are no significant valvular abnormalities.   EKG today reveals baseline artifact, but typical-appearing atrial  flutter with 4:1 conduction is otherwise unremarkable.   IMPRESSION:  [name] [name] is a pleasant 65 year old female who is seen in  consultation regarding therapeutic strategies for atrial fibrillation  and typical -appearing atrial flutter.  [name] predominance of her  arrhythmia is appear to be atrial flutter.  Therapeutic strategies for  atrial fibrillation atrial flutter were discussed in detail with [name]  [name] today.  Risks, benefits, and alternatives to EP study and  radiofrequency ablation for atrial flutter were also discussed in  detail.  These risks include but are not limited to bleeding, vascular  damage, stroke, pericardial effusion with tamponade, damage to [name]  normal conduction requiring a pacemaker, heart attack, and death.  [name]  [name] understands these risk.  She wishes to further contemplate [name]  strategy of ablation.  Presently, her INR is supratherapeutic and we  will allow to return to a range between 2 and 3 before proceeding with  ablation.  In [name] interim, I will initiate [name] [name] on Cardizem 30 mg  q.6 h. and attempt to wean her Cardizem drip as she tolerates.  We will plan  for a rate control strategy; however, [name] [name] develops  recurrent and worsening symptoms with atrial fibrillation then and we  will consider other antiarrhythmic medication in [name] future.  If [name]  [name] has symptoms of snoring and is morbidly obese, I will consider  an outpatient sleep study to evaluate for sleep apnea.      Hillis Range, MD  Electronically Signed     JA/MEDQ  D:  07/28/2008  T:  07/29/2008  Job:  [id number]   cc:   Jake Bathe, MD

## 2010-08-09 NOTE — Op Note (Signed)
NAME:  [name] [name], [name] [name]                  ACCOUNT NO.:  [id number]   MEDICAL RECORD NO.:  [id number]          PATIENT TYPE:  INP   LOCATION:  3712                         FACILITY:  [facility]   PHYSICIAN:  Jake Bathe, MD      DATE OF BIRTH:  1945-12-20   DATE OF PROCEDURE:  07/21/2008  DATE OF DISCHARGE:                               OPERATIVE REPORT   INDICATION:  Atrial flutter/fibrillation.   PROCEDURE DETAILS:  Informed consent was obtained.  Risk of stroke,  heart attack, and death were explained to the patient at length.  A  total of 75 mcg of fentanyl and 4 mg of Versed were used for conscious  sedation.  150 joules biphasic synchronized shock x1 was administered.  Successful cardioversion from atrial flutter to sinus rhythm, rate 66.  The patient's blood pressure was 116/77 postprocedure.  She tolerated  the procedure well.      Jake Bathe, MD  Electronically Signed     MCS/MEDQ  D:  07/21/2008  T:  07/21/2008  Job:  [id number]

## 2010-08-09 NOTE — Progress Notes (Signed)
Subjective:    Patient ID: [name] [name], female    DOB: February 13, 1946, 65 y.o.   MRN: [id number]  HPI 65 yo with complicated PMHX who was found to have MSSA septic shoulder in November , sp washout by Dr. Ophelia Charter. We placed her on ancef and she continued on this since then. In interim she was seen  and found to have an elevated ESR and repeat MRI showed area concerning for residual abscess along with osteomyelitis of the humerus. She underwent repeat surgery by Dr. Ophelia Charter who encountered fluid along with a torn biceps tendone but no frank purulence and she was continued on ancef (though dc note mentions 1g three times a day rather than 2 g three times a day). She was seen by Dr Ophelia Charter and still had elevated ESR and continued on IV ancef, with then improvement in ESR. She was changed from IV ancef over to oral keflex a few weeks ago.I saw her on February 27th  herr shoulder hurt occsoinally with movment but not much at rest. She is without fevers, chills or systemic symptoms. And at that time I felt comfortable stopping her antibiotics and observing her. Her ESR was 40 and CRP 1.2 in FebruaryTrend on ESR was 119 (12/11), 12 (1/12) and 40 (2/12). She is doing well and without any pain whatsoever at present.SHe is without fevers, chills, nausea or malaise She is being evaluated by Dr. Shon Hough , for breast reduction surgery and is here for preoperative clearance.    Review of Systems  Constitutional: Negative for fever, chills, diaphoresis, activity change, appetite change, fatigue and unexpected weight change.  HENT: Negative for facial swelling, neck pain and neck stiffness.   Eyes: Negative for visual disturbance.  Respiratory: Negative for apnea, cough, choking, chest tightness, shortness of breath and stridor.   Cardiovascular: Negative for chest pain and palpitations.  Gastrointestinal: Negative for nausea, abdominal pain, diarrhea, constipation, blood in stool, abdominal distention and anal bleeding.    Genitourinary: Negative for dysuria and flank pain.  Musculoskeletal: Negative for myalgias, back pain, joint swelling, arthralgias and gait problem.  Skin: Negative for color change, pallor, rash and wound.  Neurological: Negative for dizziness, weakness, light-headedness and headaches.  Hematological: Negative for adenopathy. Does not bruise/bleed easily.  Psychiatric/Behavioral: Negative for suicidal ideas, hallucinations, behavioral problems, confusion, sleep disturbance, self-injury, dysphoric mood, decreased concentration and agitation. The patient is not nervous/anxious and is not hyperactive.        Objective:   Physical Exam  Constitutional: She is oriented to person, place, and time. She appears well-developed and well-nourished. No distress.  HENT:  Head: Normocephalic.  Mouth/Throat: No oropharyngeal exudate.  Eyes: Conjunctivae and EOM are normal. Pupils are equal, round, and reactive to light. No scleral icterus.  Neck: Normal range of motion. Neck supple.  Cardiovascular: Normal rate, regular rhythm, normal heart sounds and intact distal pulses.  Exam reveals no gallop and no friction rub.   No murmur heard. Pulmonary/Chest: Effort normal and breath sounds normal. No respiratory distress. She has no wheezes. She has no rales. She exhibits no tenderness.  Abdominal: Soft. Bowel sounds are normal. She exhibits no distension and no mass. There is no tenderness. There is no rebound and no guarding.  Musculoskeletal: She exhibits no edema and no tenderness.  Neurological: She is alert and oriented to person, place, and time. Coordination normal.  Skin: Skin is warm and dry. No rash noted. She is not diaphoretic. No erythema. No pallor.  Her right shoulder scar is well-healed. There is no erythema drainage or fluctuance or warmth.  Psychiatric: She has a normal mood and affect. Judgment and thought content normal.          Assessment & Plan:  PYOGENIC ARTHRITIS,  SHOULDER REGION I think this is likely resolved. I would like to check a sedimentation rate C-reactive protein to make sure these are trending down further than the last check in February. These are encouraging although I did give the go ahead to Dr. Shon Hough to proceed with breast r reduction surgery.  METHICILLIN SUSCEPTIBLE STAPH AUREUS SEPTICEMIA I asked the patient to use Hibiclens baths once a day for 7 days prior to her breast reduction surgery and to also reduce intranasal mupirocin twice daily 7 days prior to her surgery.

## 2010-08-09 NOTE — H&P (Signed)
NAME:  Rusnak, Chistina                  ACCOUNT NO.:  [id number]   MEDICAL RECORD NO.:  [id number]          PATIENT TYPE:  INP   LOCATION:  3740                         FACILITY:  [facility]   PHYSICIAN:  Michiel Cowboy, MDDATE OF BIRTH:  07/20/1945   DATE OF ADMISSION:  01/22/2008  DATE OF DISCHARGE:                              HISTORY & PHYSICAL   PRIMARY CARE Ardeth Repetto:  Dr. Nehemiah Settle.   CHIEF COMPLAINT:  Chest pain and shortness of breath.   Patient has been reporting shortness of breath for the past 3 days or so  and presented with complaints to the office of primary care [facility]  where she was seen by Dr. Pete Glatter.  Given tachycardia and complaints  of shortness of breath as well as history of PE in the past, CT scan was  performed that did show bilateral pulmonary embolus, segmental and  subsegmental at which point patient was admitted, was called for a  direct admission and started on Heparin.  Patient initially was  reporting some mild shortness of breath with exertion, otherwise stating  she is doing well.  She did have an episode of chest pain at the point  of arrival to the floor which laster about 30 minutes or so and was  associated with chills.  EKG at that time was performed and no old EKGs  were actually available for comparison but did not appear to have acute  ischemic changes.  Patient was started on Heparin drip for her PE.  I  did discuss the CT scan findings with pulmonology who felt that patient  probably has underlying pulmonary hypertension but felt that as [place] as  patient not in distress, hemodynamically stable, no evidence so no need  for thrombolytic factor would be needed at this point.  Patient's blood  pressure initially was 140s over 80s.  After receiving Xanax and  oxycodone, it did drift down to 105/72 but patient is currently actually  feeling much better and saying that her chest pain and shortness of  breath has resolved.   PAST MEDICAL HISTORY:   Significant for:  1. Hypertension.  2. Hyperlipidemia.  3. Diabetes.  4. Per patient history of heart failure, though there is no      documentation on that.  Patient unfortunately does not come in with      office notes and does not know her own medications.  5. Patient also has history of DVT and PE, she said a couple of years      for which she was on Coumadin for 6 years.   SOCIAL HISTORY:  Patient used to smoke but does not smoke now and does  not drink or use drugs.  Lives at home.  Patient recently moved from up  [place].   FAMILY HISTORY:  No significant history of blood clots.   ALLERGIES:  No known drug allergies.   MEDICATIONS:  Patient does not remember which medicine she takes but per  ED note from 2008, she was supposed to be on:  1. Metformin 1000 twice a day.  2. Glyburide 10 mg  twice a day.  3. Lisinopril 5 mg a day.  4. Neurontin 500 3 times a day.  5. Potassium 20 mEq daily.  6. Prempro oral a day.  7. Lexapro 20 at bedtime.  8. Lasix 80 twice a day.  9. Nexium.  10.Oxycodone.  11.Aciphex oral 20 once a day.  12.Lantus, patient does not know her dose though.   PHYSICAL EXAMINATION:  VITAL SIGNS:  Currently temperature 98.4.  Heart  rate 126.  Respiration is 20.  Blood pressure now 105/72.  Satting at  99% on 2 L.  Patient appeared to be an obese female in no acute distress.  HEAD:  Nontraumatic.  Moist mucous membranes.  LUNGS:  With occasional small wheezes but no crackles noted.  HEART:  Rapid but regular, no murmurs, rubs, or gallops.  ABDOMEN:  Soft, nontender, nondistended.  LOWER EXTREMITIES:  No cyanosis, clubbing or edema.   STUDIES:  Hemoglobin 11.6, white blood cell count 4.8, platelets 261,  glucose 139, potassium 4.6, creatinine 1.06.  LFTs within normal limits.  INR 1.0.  PTT 138, I suspect this is after receiving Heparin.  CK 127.  CK-MB 2.6, troponin 0.03.  EKG showing poor baseline but there is S wave  in lead I and II and T wave  inversion in lead V1 and V3.  CT scan of the  chest showing bilateral PE with an involvement of segmental and  subsegmental pulmonary arteries.   ASSESSMENT AND PLAN:  This is a 65 year old female with bilateral  pulmonary embolus with tachycardia now with somewhat decreased blood  pressure.  We will make sure that patient is well-hydrated.  Although  she does have history of self-reported heart failure, we will monitor  her carefully and frequent vitals, continuous pulse oximetry in  telemetry.  If blood pressure continues to decline or if patient becomes  more symptomatic or have little threshold for moving into intensive care  unit.  Currently patient actually states she is feeling much better.  We  will continue to monitor clinically.  Continue Heparin drip, cycle  cardiac enzymes.  Since patient did have a somewhat different chest pain  than her baseline and also if there is elevation of troponins, that will  be worrisome for more poor prognosis.  We will also order 2-D  echocardiogram to evaluate for pulmonary hypertension as well as right-  sides strain and also Dopplers of lower extremity to eval for clot  burden.  Wonder if patient needs to be on Coumadin lifelong as this is  her second pulmonary embolus.  I have ordered hypocoagulable panel.   Diabetes.  We will do sliding scale and Lantus 15 units.  Hold metformin  as patient had recent contrast.  We will need accurate medication list  from the outpatient facility tomorrow.   History of hypertension.  We will hold all blood pressure medications  for right now.  History of questionable congestive heart failure.  Check  BNP and do a 2-D echocardiogram to evaluate for any systolic  dysfunction.  Be careful with over-hydration but also will need to  increase preload.   Prophylaxis.  Protonix plus patient is already on Heparin.      Michiel Cowboy, MD  Electronically Signed     AVD/MEDQ  D:  01/22/2008  T:   01/22/2008  Job:  [id number]

## 2010-08-09 NOTE — Assessment & Plan Note (Signed)
I asked the patient to use Hibiclens baths once a day for 7 days prior to her breast reduction surgery and to also reduce intranasal mupirocin twice daily 7 days prior to her surgery.

## 2010-08-09 NOTE — Discharge Summary (Signed)
NAME:  [name], [name]                  ACCOUNT NO.:  [id number]   MEDICAL RECORD NO.:  [id number]          PATIENT TYPE:  INP   LOCATION:  3712                         FACILITY:  [facility]   PHYSICIAN:  Jake Bathe, MD      DATE OF BIRTH:  November 02, 1945   DATE OF ADMISSION:  07/20/2008  DATE OF DISCHARGE:  07/22/2008                               DISCHARGE SUMMARY   DISCHARGE DIAGNOSES:  1. Atrial fibrillation/flutter, status post cardioversion with      treatment with Rythmol, diltiazem, metoprolol, and Coumadin.  2. Kropp-term Coumadin use.  3. Diabetes mellitus.  4. Obesity.  5. Chronic kidney disease, creatinine 1.7.  6. Hypotension.  7. Osteoarthritis.   HOSPITAL COURSE:  [name] [name] is a 65 year old female who has had AFib in  the past.  She had a TEE cardioversion in mid March and successfully  cardioverted to sinus rhythm.  Her blood pressure at one point was in  the low end of normal in the 90s, therefore metoprolol was cut in half.  She was then into the office on July 16, 2008, complaining of shortness  of breath with an episode of atrial fibrillation.  Dr. Anne Fu' goal was  to place her on Rythmol to hopefully convert and keep her out of the  atrial fibrillation.  She did have a nuclear stress test last month, has  had no ischemia.   Arrangements were made for her to come to the hospital on July 20, 2008.  We started Rythmol.  She continued to remain in atrial flutter  and she was successfully cardioverted on July 21, 2008, back to sinus  rhythm after 1 shock, 150 biphasic joules.   She remained in the hospital overnight, was ready to go home the  following day.   DISCHARGE LABORATORY DATA:  Sodium 138, potassium 3.8, BUN 36, and  creatinine 1.72 (renal insufficiency).  Protime 47.3 and INR 4.5.  Hemoglobin 9.3, hematocrit 27.9, white count 6.3, and platelets 287.  TSH 1.883.   DISCHARGE MEDICATIONS:  1. Metformin 1000 mg p.o. b.i.d.  2. Lasix 40 mg a day.  3.  Potassium 20 mEq a day.  4. Coumadin 5 mg a day, she is to start this back on July 23, 2008.  5. Oxycodone p.r.n.  6. Promethazine p.r.n.  7. Zocor 40 mg.  8. Gabapentin 600 mg t.i.d.  9. Aciphex 20 mg as needed for stomach.  10.Lexapro 10 mg a day.  11.Metoprolol, she was on 100 mg half tablet daily, now we will change      this to 25 mg twice a day.  12.Etodolac 400 mg twice a day.  13.Rythmol 325 mg twice a day.  14.Cardizem 240 mg a day.   She was discharged to home in stable and improved condition.  She is to  remain on low-sodium, heart-healthy diabetic diet.  Increase activity  slowly.  She is to follow up with Dr. Nehemiah Settle for Coumadin check this  Friday on July 24, 2008, at anytime during the day.  She is to follow  up with Dr. Anne Fu on  Jul 31, 2008, at 2:15 p.m.  She is to arrive 30  minutes early for blood work.  We will check a BMET at that time to  assure that her creatinine is on the downside.   Greater than 30 minutes discharge time.      Guy Franco, P.A.      Jake Bathe, MD  Electronically Signed    LB/MEDQ  D:  07/22/2008  T:  07/23/2008  Job:  [id number]   cc:   Deirdre Peer. Polite, M.D.

## 2010-08-09 NOTE — Patient Instructions (Signed)
We will see what your inflammatory markers look like and if they are encouraging I will give go ahead for surgery Before you surgery I would like you to wash with hibiclens soap once per day (entire body and do not apply makeup for an hour after bath) Also please apply mupirocin 2% inside your nostrils twice daily during that week as well

## 2010-08-09 NOTE — Op Note (Signed)
NAME:  [name] [name], [name] [name]                  ACCOUNT NO.:  [id number]   MEDICAL RECORD NO.:  [id number]          PATIENT TYPE:  INP   LOCATION:  3701                         FACILITY:  [facility]   PHYSICIAN:  Doylene Canning. Ladona Ridgel, MD    DATE OF BIRTH:  [birth year]   DATE OF PROCEDURE:  07/30/2008  DATE OF DISCHARGE:                               OPERATIVE REPORT   PROCEDURE PERFORMED:  Electrophysiologic study followed by internal  pacing cardioversion of the patient back to sinus rhythm.   INTRODUCTION:  The patient is a 65 year old woman with a history of  symptomatic atrial flutter, who had been placed on Rythmol, but despite  this had recurrence of her atrial flutter.  She presented to the  hospital with atrial flutter and a rapid ventricular response.  She was  placed on Cardizem with some rate control, but still with symptomatic  atrial flutter, and she is now referred for catheter ablation of her  atrial flutter.   PROCEDURE:  After informed consent was obtained, the patient was taken  to the diagnostic EP lab in a fasting state.  After usual preparation  and draping, a 6-French hexapolar catheter was inserted percutaneously  into the right jugular vein and advanced to the coronary sinus.  A 7-  French 20-pole Halo catheter was inserted percutaneously into the right  femoral vein and advanced to the right atrium.  A 5-French quadripolar  catheter was inserted percutaneously in the right femoral vein and  advanced to His bundle region.  After measurement of basic intervals,  mapping was carried out demonstrating atypical atrial flutter.  The  earliest atrial activation was in the CS12, which was the distal CS  catheter, which was in a position approximately 5 o'clock in the mitral  valve annulus.  In the right atrium, there was early activation along  the CS os as well as high in the atrial septum near block of His bundle.  Pacing was subsequently carried out from multiple sites in the left  atrium and the right atrium with the left atrial pacing occurring in the  coronary sinus.  Pacing in the most distal portion of the coronary  sinus, which was approximately 5 o'clock in the LAO projection  demonstrated a post pacing interval that was approximately 25 msec  greater than the atrial flutter cycle length.  Pacing in the proximal  coronary sinus demonstrated a post pacing interval that was 60 msec  greater than the tachycardia cycle length.  Pacing high on the right  atrial septum demonstrated post pacing interval of approximately 80 msec  and pacing on the atrial flutter isthmus demonstrated a post pacing  interval that was over 150 msec longer than the tachycardia cycle  length.  Along with our mapping data, this clearly demonstrated a left  atrial flutter, most likely originating along the mitral valve annulus  and near the left-sided pulmonary veins.  At this point, the pacing  atrial septum was probed with the His bundle catheter, but unfortunately  the atrial septum was intact and there was no evidence of a patent  foramen ovale.  At this point, pacing was subsequently carried out  starting at a cycle length of 290 msec and stepwise decreased down to  220 msec, which resulted in a pace termination and conversion of the  patient from atrial flutter to sinus rhythm.  Rapid ventricular pacing  was then carried out from the RV inflow tract demonstrating a VA  Wenckebach cycle length of 320 msec.  During rapid ventricular pacing,  the atrial activation appeared to be midline and decremental.  Programmed ventricular stimulation was carried out from the RV apex at a  base drive cycle length of 161 msec.  The S1-S2 interval was stepwise  decreased down to 280 msec where ventricular refractoriness was  observed.  During programmed ventricular stimulation, the atrial  activation was midline and decremental.  Next, programmed atrial  stimulation was carried out from the coronary  sinus at a base drive  cycle length of 096 msec.  The S1-S2 interval was stepwise decreased  from 440 msec down to 400 msec where the AV node ERP was observed.  Additional decrements were carried out down to 280 msec where atrial  refractoriness was demonstrated.  During programmed atrial stimulation,  there were no AH jumps, no echo beats, and there was no inducible SVT or  atrial flutter.  Finally, rapid atrial pacing was again carried out from  both the coronary sinus and the right atrium at a pacing cycle length of  500 msec and stepwise decreased 410 msec where AV Wenckebach was  observed.  Additional decrements were then carried out down to 200 msec,  and the patient atrial flutter could not be induced.  At this point, the  catheters were removed.  Hemostasis was assured, and the patient was  returned to her room in satisfactory condition.   COMPLICATIONS:  There were no immediate procedure complications.   RESULTS:  A.  Baseline ECG.  The baseline ECG demonstrates atrial  flutter with variable AV conduction.  B.  Baseline intervals.  The atrial flutter cycle length was  approximately 290-300 msec, the HV interval was 35 msec.  The QRS  duration was 110 msec.  C.  Rapid ventricular pacing following a pace termination of an atrial  flutter.  Rapid atrial pacing demonstrated a VA Wenckebach cycle length  of 320 msec.  During rapid ventricular pacing, the atrial activation  sequence was midline and decremental.  D.  Programmed ventricular stimulation.  Programmed ventricular  stimulation was carried out from the RV apex at base drive cycle length  of 045 msec with the S1-S2 interval stepwise decreased down to 280 msec  where ventricular refractoriness was observed.  During programmed  ventricular stimulation, the atrial activation was midline and  decremental.  E.  Rapid atrial pacing.  Rapid atrial pacing was carried out from the  coronary sinus in the right atrium, resulting in  pace termination of the  atrial flutter.  It also demonstrates an AV Wenckebach cycle length of  410 msec.  F.  Programmed atrial stimulation.  Programmed atrial stimulation was  carried out from coronary sinus in the high right atrium at base drive  cycle length of 409 msec with the S1-S2 interval stepwise decreased from  440 msec down to 400 msec where the AV node ERP was observed.  During  programmed atrial stimulation, there were no AH jumps and no echo beats.  G.  Arrhythmias observed;  1. Atrial flutter initiation present at the time of EP study, duration  was sustained.  Cycle length was 300 msec.  Method of termination      was with rapid atrial pacing.      a.     Mapping.  Mapping of the atrial flutter demonstrated a left       atrial flutter with the earliest atrial activation and shortest       post pacing intervals along the lateral portion of the mitral       valve annulus in the left atrium.   CONCLUSION:  This study demonstrates left atrial flutter, which was not  targeted for catheter ablation as it was felt not to be the most  appropriate final therapy at the present time.  It does demonstrate  successful pace termination of the atrial flutter, internally  terminating the atrial flutter and restoring sinus rhythm.      Doylene Canning. Ladona Ridgel, MD  Electronically Signed     GWT/MEDQ  D:  07/30/2008  T:  07/31/2008  Job:  [id number]   cc:   Hillis Range, MD  Deirdre Peer. Polite, M.D.

## 2010-08-09 NOTE — Discharge Summary (Signed)
NAME:  [name] [name], [name] [name]                  ACCOUNT NO.:  [id number]   MEDICAL RECORD NO.:  [id number]          PATIENT TYPE:  INP   LOCATION:  3701                         FACILITY:  [facility]   PHYSICIAN:  Jake Bathe, MD      DATE OF BIRTH:  01-20-1946   DATE OF ADMISSION:  07/27/2008  DATE OF DISCHARGE:  08/04/2008                               DISCHARGE SUMMARY   FINAL DIAGNOSES:  1. Atrial fibrillation/flutter - status post electrophysiologic study      - paced termination of atrial flutter, unable to ablate secondary      to left-sided foci of arrhythmia.  2. Diabetes mellitus.  3. Hypertension.  4. Status post pulmonary embolus, last in October 2009 on chronic      Coumadin.  5. Obesity.  6. Anemia.   BRIEF HOSPITAL COURSE:  A 65 year old African American female who was  admitted with rapid atrial fibrillation/flutter.  LifeWatch monitor  demonstrated rates in the 170s, and she was symptomatic.  Approximately  1-2 weeks prior to this admission, she was cardioverted successfully on  Rythmol therapy 325 mg extended release twice a day.  Thus, she failed  antiarrhythmic Rythmol.  When she was here in the hospital, she was  placed on a diltiazem drip and was successfully rate controlled while  demonstrating an atrial flutter rhythm underlying.  Her rates were in  the 70s.  This was on low-dose diltiazem as well as metoprolol 50 mg  twice a day.  She still complained of shortness of breath and fatigue  and therefore Electrophysiology was consulted and Dr. Johney Frame and Dr.  Ladona Ridgel both saw the patient.  Dr. Ladona Ridgel then brought her to the EP lab  last Thursday where an attempt at ablation was made.  The foci for  atrial flutter, however, was in the left atrium, this would require a  transseptal approach and therefore oblation was not performed.  She did,  however, have a paced termination of her atrial flutter, and since last  Thursday (today is to Tuesday), she has been in sinus  bradycardia.   At times during the sinus bradycardia, she demonstrated a heart rate in  the upper 30s, low 40s, which is transient.  She is asymptomatic during  these times.  At 5:00 a.m. on day of discharge, she did demonstrate  heart rate of 40, however, asymptomatic.   We have kept her here in the hospital to wait for her INR to be 2.0 and  she did demonstrate an INR of 2.0 yesterday.  Today, she is also 2.0.  During review of her labs, however, her hematocrit had steadily  decreased from 33 on admission down to 26.8 on the day prior to  discharge and this morning is 29.9.  She did complain at one point of  black and tarry stools; however, she has had infrequent stools and  otherwise no gross bleeding episodes.  Dr. Nehemiah Settle was made aware of her  anemia and will be happy to help her after discharge.  Her iron level  was 60.  Her ferritin was 56.  Her vitamin B12 was 452.  Her folate was  1350.   PHYSICAL EXAMINATION ON DAY OF DISCHARGE:  VITAL SIGNS:  Blood pressure  116/68, pulse 56, respirations 18, temperature 97.8, sating 99% on room  air.  GENERAL:  Alert and oriented x3 in no acute distress, ready to go  home.  CARDIOVASCULAR:  Bradycardic, regular rhythm without any murmurs.  LUNGS:  Clear to auscultation bilaterally.  ABDOMEN:  Soft, nontender, normoactive bowel sounds, obese.  EXTREMITIES:  No clubbing, cyanosis, or edema.  EP study site and groin  clean, dry, and intact.  No hematomas.  NEUROLOGIC:  Nonfocal.   LABORATORY DATA:  Hemoglobin 9.8, hematocrit 29.9, platelets 276.  Creatinine 1.29 down from 1.5 on admission.  Glucose elevated at 229.   DISCHARGE MEDICATIONS:  1. Diltiazem CD 120 mg once a day.  2. Warfarin, previous dose per Dr. Nehemiah Settle  3. Metoprolol tartrate 50 mg twice a day.  4. Metformin 1000 mg twice a day  5. Lasix 40 mg once a day.  6. Zocor 40 mg once a day.  7. Gabapentin 600 mg twice a day.  8. Aciphex 20 mg as needed.  9. Lexapro 10 mg once  a day.  10.Etodolac 400 mg twice a day.  11.Rythmol has been discontinued.   FOLLOWUP:  She is going to be following up with me in 2 weeks and  following with Dr. Nehemiah Settle within a week or two.      Jake Bathe, MD  Electronically Signed     MCS/MEDQ  D:  08/04/2008  T:  08/04/2008  Job:  [id number]

## 2010-08-09 NOTE — H&P (Signed)
NAME:  [name] [name], [name] [name]                  ACCOUNT NO.:  [id number]   MEDICAL RECORD NO.:  [id number]          PATIENT TYPE:  EMS   LOCATION:  MAJO                         FACILITY:  [facility]   PHYSICIAN:  Ramiro Harvest, MD    DATE OF BIRTH:  04-17-1945   DATE OF ADMISSION:  08/15/2008  DATE OF DISCHARGE:                              HISTORY & PHYSICAL   PRIMARY CARE PHYSICIAN:  Renford Dills, Bennye Alm.   HISTORY OF PRESENT ILLNESS:  [name] [name] is a 65 year old African  American female recently hospitalized on Jul 27, 2008 through Aug 04, 2008 for atrial fibrillation/atrial flutter who also has a history of  atrial fibrillation/atrial fibrillation on Coumadin, type 2 diabetes,  hypertension, diastolic heart failure, history of a pulmonary embolism  x2 and DVT on chronic Coumadin therapy with obesity, GERD, anemia,  hyperlipidemia, who presented to the ED with a right upper extremity  pain and swelling.  The patient woke on the morning of admission with  complaints of right upper extremity pain and inability to move the right  upper extremity, edema and pain.  The patient took four of her  Percocet's at home with relief of her pain.  The patient also complained  of having some hemoptysis.  The patient does endorse some nausea,  constipation, global weakness, some dysphagia with solids and liquids  and feels dry in her throat and feels that her throat is sore and raw as  well.  The patient denies any fevers, no chills, no chest pain, no  shortness of breath, no emesis, no abdominal pain, no diarrhea, no  melena or hematemesis, no hematochezia, no cough, no dysuria, no focal  neurological symptoms.  The patient was seen in the ED, INR obtained and  was found to be greater than 9.8. B-met with a BUN of 51 and a  creatinine of 3.08.  CBC with a white count of 7.1, hemoglobin of 10.8,  platelets of 322,000.  The patient was given 5 mg of vitamin K and we  were called to admit the patient  for further evaluation and management.   ALLERGIES:  SHELLFISH (has emesis, diarrhea and sharp abdominal pain.)   PAST MEDICAL HISTORY:  1. History of atrial fibrillation/atrial flutter, status post EP study      with paced termination of atrial flutter, unable to ablate      secondary to left-sided full side of arrhythmia.  2. Diabetes.  3. Hypertension.  4. Pulmonary embolus x2, last one in October 2009, on chronic Coumadin      therapy.  5. History of right lower extremity deep vein thrombosis.  6. Obesity.  7. Anemia.  8. Diabetic neuropathy.  9. Gastroesophageal reflux disease.  10.Depression/anxiety.  11.Osteoarthritis.  12.History of congestive heart failure with ejection fraction of 55%.  13.Hyperlipidemia.  14.Bronchitis.   HOSPITAL COURSE:  1. Diltiazem CD 120 mg p.o. daily  2. Coumadin 7.5 mg p.o. daily.  3. Metoprolol 100 mg p.o. daily.  4. Metformin 1000 mg p.o. b.i.d.  5. Lasix 40 mg p.o. daily.  6. Simvastatin 40 mg p.o. daily.  7. Gabapentin 600 mg p.o. t.i.d.  8. Aciphex 20 mg p.r.n.  9. Lexapro 10 mg p.o. daily.  10.Etodolac 400 mg b.i.d.  11.Rythmol SR 325 mg p.o. b.i.d., although per last discharge summary,      the patient was instructions to discontinue her Rythmol.  12.Oxycodone 5/325 2 tablets p.o. t.i.d. p.r.n.  13.Lantus 20 units subcutaneously daily.  14.Phenergan 25 mg as needed.   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 No tobacco  use.  No alcohol use.  No IV drug use.  The patient is single.   FAMILY HISTORY:  Noncontributory.   REVIEW OF SYSTEMS:  As per HPI, otherwise negative.   PHYSICAL EXAMINATION:  VITAL SIGNS:  Temperature 97.5, blood pressure  106/65, pulse of 57, respiratory rate 16, sating 97% on room air.  GENERAL:  Patient lying on gurney in no apparent distress.  HEENT: Normocephalic, atraumatic.  Pupils are equal, round, and reactive   to light. Extraocular movements intact.  Muddy sclera.  Oropharynx is  clear.  No lesions, no exudates.  NECK:  Supple.  No lymphadenopathy.  Dry mucous membranes.  RESPIRATORY:  Lungs are clear to auscultation bilaterally.  No wheezes,  no crackles.  No rhonchi.  CARDIOVASCULAR:  Regular rate and rhythm.  No murmurs, rubs or gallops.  ABDOMEN:  Soft, nontender, nondistended.  Positive bowel sounds.  EXTREMITIES:  No clubbing, cyanosis or edema.  Bilateral 2+ radial  pulses. Right upper extremity nontender to palpation, is soft, no  tightness and slightly more swollen than the left upper extremity.  NEUROLOGIC:  Alert and oriented x3.  Cranial nerves II-XII are grossly  intact.  No focal deficits.   LABORATORY DATA:  Admission labs.  PT greater than 90, INR greater than  9.8.  Sodium 136, potassium of 4.0, chloride 100, bicarb 25, BUN 51,  creatinine 3.08, glucose of 208, calcium of 8.8, creatinine was 1.29 on  Aug 01, 2008.  CBC:  White count 7.1, hemoglobin 10.8, platelets of  322,000, hematocrit 32.3, ANC of 4.3   Doppler ultrasounds of bilateral upper extremities was negative.   Chest x-ray shows cardiomegaly and central pulmonary vascular congestion  and mild tortuous aorta.   ASSESSMENT AND PLAN:  Ms. [name] [name] is a 65 year old African American  female with history of atrial fibrillation/atrial flutter, on Coumadin  therapy. History of PE x2 and deep vein thrombosis, history of diabetes,  CHF, hyperlipidemia, who presented to the ED with right upper extremity  pain and found to have a supra-therapeutic INR and acute renal failure.  1. RIGHT UPPER EXTREMITY PAIN:  Questionable etiology on Doppler      ultrasound Right upper extremity pain, questionable etiology.  The      son Doppler ultrasounds have been negative for DVT.  Question of      possible hematoma with elevated INR.  Check a CT of the right upper      extremity to rule out hematoma without contrast secondary to the       patient's acute renal failure.  Will reverse INR with vitamin K and      once INR is less than seven and if there is no overt bleeding, will      hold Coumadin and monitor INR drift down. Supportive care for now      and pain management.  2. ACUTE RENAL FAILURE:  Creatinine was 1.29 of Aug 01, 2008,  likely      prerenal azotemia secondary to volume depletion in the setting of      diuretic use.  Check a UA with cultures and sensitivities.  Check a      fractional excretion of sodium. Hydrate with IV fluids and followup      if no improvement. Will check a renal ultrasound. Will hold the      patient's Lasix and metformin for now and follow.  3. SUPRA-THERAPEUTIC INR:  Will give vitamin K 5 mg IV times one.      Check a PT/INR. Once INR is less than 7 with no overt bleeding,      would watch Coumadin levels drift down and follow.  4. HISTORY OF PULMONARY EMBOLISM X2/DEEP VEIN THROMBOSIS:  The patient      with a supra-therapeutic INR.  Will hold Coumadin for now.  See      problem #3.  5. ATRIAL FIBRILLATION/ATRIAL FLUTTER:  Check an EKG. Continue      diltiazem and metoprolol for rate control. Hold Coumadin secondary      to problem #3 and follow. Continue Rythmol.  6. TYPE 2 DIABETES MELLITUS:  Check a hemoglobin A1C. Hold metformin      secondary to acute renal failure.  Place on Lantus and sliding      scale insulin.  7. HYPERLIPIDEMIA:  Simvastatin.  8. DIABETIC NEUROPATHY:  Gabapentin.  9. HISTORY OF CONGESTIVE HEART FAILURE:  Will hold Lasix secondary to      problem #2. Continue home dose beta blocker and statin.  10.DEHYDRATION:  Hydrate with IV fluids.  11.DEPRESSION:  Continue home dose of Lexapro.  12.QUESTIONABLE DYSPHAGIA:  Likely secondary to dehydration. Will      hydrate patient and monitor. If no improvement in her symptoms may      consider a swallow evaluation per speech therapy or a GI consult      for possible endoscopy.  13.GASTROESOPHAGEAL REFLUX DISEASE:   Protonix.  14.ANEMIA:  Follow H&H.  The patient to have an outpatient workup per      her prior discharge summary. This can be done as an outpatient.      Monitor HEMOGLOBIN AND HEMATOCRIT.  15.PROPHYLAXIS:  Protonix for GI prophylaxis, Coumadin for DVT      prophylaxis.      Ramiro Harvest, MD  Electronically Signed     DT/MEDQ  D:  08/15/2008  T:  08/15/2008  Job:  [id number]   cc:   Deirdre Peer. Nehemiah Settle, M.D.  Jake Bathe, MD

## 2010-08-12 NOTE — Discharge Summary (Signed)
NAME:  [name] [name], [name] [name]                  ACCOUNT NO.:  [id number]   MEDICAL RECORD NO.:  [id number]          PATIENT TYPE:  INP   LOCATION:  6742                         FACILITY:  [facility]   PHYSICIAN:  Deirdre Peer. Polite, M.D. DATE OF BIRTH:  1946/02/19   DATE OF ADMISSION:  08/15/2008  DATE OF DISCHARGE:  08/17/2008                               DISCHARGE SUMMARY   DISCHARGE DIAGNOSES:  1. Right upper extremity pain, unknown etiology.  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  2. Supratherapeutic INR.  Discharge INR 1.3.  The patient to continue      Coumadin with further outpatient management of INR.  3. Acute renal failure, probable medication related, i.e., metformin,      nonsteroidal antiinflammatory drug, Lasix, angiotensin-converting      enzyme inhibitor.  Above medications were held, intravenous fluid      provided.  Discharge creatinine 1.2.  4. Diabetes, poor control secondary to noncompliance.  5. History of atrial fibrillation/flutter.  6. Hypertension.  7. 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  8. History of right lower extremity deep venous thrombosis.  9. Obesity.  10.Anemia.  11.Diabetic nephropathy.  12.Gastroesophageal reflux disease.  13.Depression.  14.Osteoarthritis.  15.Hyperlipidemia.   DISCHARGE MEDICATIONS:  The patient is asked to stop NSAIDs, also stop  metformin, also stop lisinopril.  She is advised to continue:  1. Glyburide 10 mg b.i.d.  2. Neurontin 300 mg t.i.d.  3. Potassium 20 mEq daily.  4. Lexapro 20 mg daily.  5. Lasix 40 mg daily.  6. Oxycodone/acetaminophen p.r.n.  7. Aciphex 20 mg.  8. Lantus 30 units daily.   DISPOSITION:  The patient is discharged to home in stable condition.   STUDIES:  Doppler ultrasound, right upper extremity negative for DVT.  CT of the right and left extremity negative for pathology.  INR  admission 9.8.   Creatinine on admission 3.   HISTORY OF PRESENT ILLNESS:  A 65 year old female presented to the  hospital with complain of right upper extremity pain.  In the ED, she  was evaluated, found to have acute renal failure as well as  supratherapeutic INR.  Admission was deemed necessary for further  evaluation and treatment.  Please see dictated H&P for further details.   PAST MEDICAL HISTORY:  Per admission H&P.   MEDICATIONS:  Per admission H&P.   SOCIAL HISTORY:  Per admission H&P.   PAST SURGICAL HISTORY:  Per admission H&P.   ALLERGIES:  Per admission H&P.   REVIEW OF SYSTEMS:  Per admission H&P.   HOSPITAL COURSE:  The patient was admitted to a telemetry floor bed for  evaluation and treatment of the above problems.  1. As for her right upper extremity pain, it seemed to abate.  As      stated above, ultrasound and CT were negative for any pathology.      Exam was fairly unremarkable.  2. Acute renal failure.  Probable multifactorial, improved  significantly after IV fluids and holding medications as stated      above.  Discharge creatinine 1.2.  3. Supratherapeutic INR.  The patient denied taking too many      medications, however, no other possible reason would explain her      elevated INR.  The patient was given vitamin K x1.  INR drifted      down to 1.3.  There was no bleeding noted during this      hospitalization and no complications from her supratherapeutic INR.      The patient was discharged on home dose of Coumadin with plans for      further outpatient followup of her INR.  The patient has several      comorbidities, which were essentially stable.  We will continue      current outpatient management of those issues particularly      diabetes, which is uncontrolled again secondary to noncompliance.      Deirdre Peer. Polite, M.D.  Electronically Signed     RDP/MEDQ  D:  09/09/2008  T:  09/10/2008  Job:  [id number]

## 2010-08-12 NOTE — Discharge Summary (Signed)
NAME:  [name] [name], [name] [name]                  ACCOUNT NO.:  [id number]   MEDICAL RECORD NO.:  [id number]          PATIENT TYPE:  INP   LOCATION:  3740                         FACILITY:  [facility]   PHYSICIAN:  Deirdre Peer. Polite, M.D. DATE OF BIRTH:  [birth year]   DATE OF ADMISSION:  01/21/2008  DATE OF DISCHARGE:  01/24/2008                               DISCHARGE SUMMARY   DISCHARGE DIAGNOSES:  1. Bilateral pulmonary embolism, recurrent.  Ultrasound negative for      deep vein thrombosis.  Recommend lifelong Coumadin.  2. History of congestive heart failure.  Echo shows ejection fraction      of 55%, septal hypokinesis, questionable intra-atrial septum      defect, left-to-right flow.  Outpatient followup by Cardiology.  3. Diabetes, controlled A1c less than 7.  4. Hypertension.  5. High cholesterol.  6. Obesity.  7. Chronic pain secondary to osteoarthritis.  8. Bronchitis.   DISCHARGE MEDICATIONS:  1. Gabapentin 300 mg 3 times a day.  2. Etodolac 400 mg b.i.d.  3. Lisinopril 5 mg daily.  4. Lasix 80 mg b.i.d.  5. Metformin 1 g b.i.d.  6. Lantus 20 units daily.  7. The patient had to stop Prempro.  8. Coumadin 10 mg daily.  9. Zithromax 250 mg x3 more days.  10.Lopressor 25 mg daily.  11.Lovenox 100 mg q.12 h. until therapeutic INR.   DISPOSITION:  Discharged home in stable condition for outpatient  followup with primary MD and [facility].   STUDIES:  A CT of the chest showed multiple bilateral large acute  central PE.  Chest x-ray, cardiac silhouette enlarged, otherwise no  infiltrate.  A 2-D echo, EF 55%.  Findings were suggestive of  hypokinesis of basal/midseptal wall.  Left atrium was mildly dilated.  There appears to be left to right color flow Doppler signal estimated at  intraatrial septum, possibility of patent foramen ovale of small ASD  could not be excluded.   HISTORY OF PRESENT ILLNESS:  A 65 year old female admitted to the  hospital after outpatient CT showed PE.   Please see dictated H and P for  details.   PAST MEDICAL HISTORY:  Per admission H and P.   MEDICATIONS ON ADMISSION:  Per admission H and P.   SOCIAL HISTORY:  Per admission H and P.   FAMILY HISTORY:  Noncontributory.   HOSPITAL COURSE:  1. The patient was admitted to a telemetry floor bed for evaluation      and treatment of bilateral PE.  Of note, the patient has a history      of DVT and PE in the past and surprisingly had been placed on birth      control Prempro, this was stopped.  She was recommended now to      restart and lifelong Coumadin recommended.  The patient had some      tachycardia during the hospitalization which improved with addition      of a beta-blocker.  She had a venous Doppler ultrasound negative      for DVT.  There were no complications from the  standpoint of      bilateral PE during this hospitalization.  2. Bronchitis, treated with Zithromax.  No complications.  3. Hypertension.  4. Morbid obesity.   The patient was discharged home on Coumadin and Lovenox with outpatient  followup.      Deirdre Peer. Polite, M.D.  Electronically Signed     RDP/MEDQ  D:  04/01/2008  T:  04/02/2008  Job:  [id number]

## 2010-08-15 ENCOUNTER — Telehealth: Payer: Self-pay | Admitting: Infectious Disease

## 2010-08-15 NOTE — Telephone Encounter (Signed)
Message copied by Acey Lav on Mon Aug 15, 2010 12:35 PM ------      Message from: Zenaida Niece DAM, Madell Heino      Created: Wed Aug 10, 2010  5:43 PM      Regarding: esr still high       Send note to her doctor that esr is still high

## 2010-08-15 NOTE — Telephone Encounter (Signed)
I reviewed Ms Longs labs. Her ESR is still high. Clinically she has no evidence of infection in the shoulder. I am not going to pursue repeat MRI of the shoulder at thist time. I have sent a note to Dr. Shon Hough that I am ok for her to undergo breast reduction surgery as I doubt even a smoldering shoulder infection would effect outcome of surgery in soft tissue that is at separate site. I do think she should do MSSA decolonization with hibiclenz and mupirocin

## 2010-10-07 ENCOUNTER — Ambulatory Visit (HOSPITAL_COMMUNITY)
Admission: RE | Admit: 2010-10-07 | Payer: Federal, State, Local not specified - PPO | Source: Ambulatory Visit | Admitting: Specialist

## 2010-12-26 LAB — CBC
HCT: 32.2 — ABNORMAL LOW
HCT: 32.7 — ABNORMAL LOW
HCT: 35.3 — ABNORMAL LOW
Hemoglobin: 10.8 — ABNORMAL LOW
Hemoglobin: 10.9 — ABNORMAL LOW
Hemoglobin: 11.2 — ABNORMAL LOW
Hemoglobin: 11.6 — ABNORMAL LOW
MCHC: 32.8
MCHC: 33.4
MCHC: 33.6
MCV: 92.5
MCV: 93
MCV: 93.4
Platelets: 266
Platelets: 279
RBC: 3.48 — ABNORMAL LOW
RBC: 3.5 — ABNORMAL LOW
RBC: 3.63 — ABNORMAL LOW
RBC: 3.8 — ABNORMAL LOW
RDW: 13.5
RDW: 13.6
RDW: 13.9
WBC: 6.3
WBC: 6.7

## 2010-12-26 LAB — PROTHROMBIN GENE MUTATION

## 2010-12-26 LAB — PROTEIN S ACTIVITY: Protein S Activity: 76 % (ref 69–129)

## 2010-12-26 LAB — COMPREHENSIVE METABOLIC PANEL
ALT: 16
ALT: 17
AST: 17
Albumin: 3 — ABNORMAL LOW
Alkaline Phosphatase: 32 — ABNORMAL LOW
BUN: 12
BUN: 16
CO2: 21
CO2: 24
Calcium: 8.9
Calcium: 9
Chloride: 110
Creatinine, Ser: 1.04
GFR calc Af Amer: >60
GFR calc non Af Amer: 53 — ABNORMAL LOW
GFR calc non Af Amer: 54 — ABNORMAL LOW
Glucose, Bld: 153 — ABNORMAL HIGH
Glucose, Bld: 154 — ABNORMAL HIGH
Potassium: 4.6
Sodium: 135
Sodium: 141
Total Bilirubin: 0.7
Total Protein: 5.5 — ABNORMAL LOW
Total Protein: 6.6

## 2010-12-26 LAB — HEPARIN LEVEL (UNFRACTIONATED)
Heparin Unfractionated: 0.19 — ABNORMAL LOW
Heparin Unfractionated: 0.27 — ABNORMAL LOW
Heparin Unfractionated: 0.39

## 2010-12-26 LAB — GLUCOSE, CAPILLARY
Glucose-Capillary: 137 — ABNORMAL HIGH
Glucose-Capillary: 146 — ABNORMAL HIGH
Glucose-Capillary: 158 — ABNORMAL HIGH
Glucose-Capillary: 162 — ABNORMAL HIGH
Glucose-Capillary: 164 — ABNORMAL HIGH
Glucose-Capillary: 225 — ABNORMAL HIGH
Glucose-Capillary: 229 — ABNORMAL HIGH

## 2010-12-26 LAB — CARDIAC PANEL(CRET KIN+CKTOT+MB+TROPI)
CK, MB: 2.3
Relative Index: 1.7
Relative Index: 2.1
Total CK: 107
Total CK: 127
Troponin I: 0.03
Troponin I: 0.03
Troponin I: 0.06

## 2010-12-26 LAB — PROTIME-INR
INR: 1
INR: 1
INR: 1.1
Prothrombin Time: 13.5
Prothrombin Time: 14.7

## 2010-12-26 LAB — CARDIOLIPIN ANTIBODIES, IGG, IGM, IGA
Anticardiolipin IgG: <7 — ABNORMAL LOW (ref <11)
Anticardiolipin IgM: <7 — ABNORMAL LOW (ref <10)

## 2010-12-26 LAB — PROTEIN C, TOTAL: Protein C, Total: 97 % (ref 70–140)

## 2010-12-26 LAB — LUPUS ANTICOAGULANT PANEL
DRVVT: 52 — ABNORMAL HIGH (ref 36.1–47.0)
PTT Lupus Anticoagulant: 82.8 — ABNORMAL HIGH (ref 36.3–48.8)
PTTLA 4:1 Mix: 70.7 — ABNORMAL HIGH (ref 36.3–48.8)

## 2010-12-26 LAB — FACTOR 5 LEIDEN

## 2010-12-26 LAB — BETA-2-GLYCOPROTEIN I ABS, IGG/M/A
Beta-2 Glyco I IgG: <4 U/mL (ref <20)
Beta-2-Glycoprotein I IgM: <4 U/mL (ref <10)

## 2010-12-26 LAB — TSH: TSH: 0.717

## 2010-12-26 LAB — ANTITHROMBIN III: AntiThromb III Func: 85 (ref 76–126)

## 2010-12-26 LAB — PROTEIN C ACTIVITY: Protein C Activity: 117 % (ref 75–133)

## 2010-12-26 LAB — HOMOCYSTEINE: Homocysteine: 12

## 2011-01-09 LAB — URINALYSIS, ROUTINE W REFLEX MICROSCOPIC
Glucose, UA: NEGATIVE
Hgb urine dipstick: NEGATIVE
Ketones, ur: NEGATIVE
Nitrite: NEGATIVE
Protein, ur: NEGATIVE
Specific Gravity, Urine: 1.019
Urobilinogen, UA: 0.2
pH: 6

## 2011-01-09 LAB — I-STAT 8, (EC8 V) (CONVERTED LAB)
Bicarbonate: 23.2
Glucose, Bld: 115 — ABNORMAL HIGH
TCO2: 25
pH, Ven: 7.284

## 2011-01-09 LAB — POCT I-STAT CREATININE
Creatinine, Ser: 1.2
Operator id: 198171

## 2011-02-02 ENCOUNTER — Encounter (HOSPITAL_COMMUNITY): Payer: Self-pay | Admitting: *Deleted

## 2011-02-02 ENCOUNTER — Other Ambulatory Visit: Payer: Self-pay | Admitting: Cardiology

## 2011-02-02 ENCOUNTER — Inpatient Hospital Stay (HOSPITAL_COMMUNITY)
Admission: AD | Admit: 2011-02-02 | Discharge: 2011-02-07 | DRG: 127 | Disposition: A | Discharge status: Home / Self Care | Payer: Federal, State, Local not specified - PPO | Source: Ambulatory Visit | Attending: Cardiology | Admitting: Cardiology

## 2011-02-02 ENCOUNTER — Inpatient Hospital Stay (HOSPITAL_COMMUNITY): Payer: Federal, State, Local not specified - PPO

## 2011-02-02 DIAGNOSIS — I4891 Unspecified atrial fibrillation: Secondary | ICD-10-CM | POA: Diagnosis present

## 2011-02-02 DIAGNOSIS — Z6841 Body Mass Index (BMI) 40.0 and over, adult: Secondary | ICD-10-CM

## 2011-02-02 DIAGNOSIS — E785 Hyperlipidemia, unspecified: Secondary | ICD-10-CM | POA: Diagnosis present

## 2011-02-02 DIAGNOSIS — G8929 Other chronic pain: Secondary | ICD-10-CM | POA: Diagnosis present

## 2011-02-02 DIAGNOSIS — Z7901 Long term (current) use of anticoagulants: Secondary | ICD-10-CM

## 2011-02-02 DIAGNOSIS — N183 Chronic kidney disease, stage 3 unspecified: Secondary | ICD-10-CM | POA: Diagnosis present

## 2011-02-02 DIAGNOSIS — I5033 Acute on chronic diastolic (congestive) heart failure: Secondary | ICD-10-CM | POA: Insufficient documentation

## 2011-02-02 DIAGNOSIS — M25519 Pain in unspecified shoulder: Secondary | ICD-10-CM | POA: Diagnosis present

## 2011-02-02 DIAGNOSIS — N189 Chronic kidney disease, unspecified: Secondary | ICD-10-CM | POA: Diagnosis present

## 2011-02-02 DIAGNOSIS — I10 Essential (primary) hypertension: Secondary | ICD-10-CM | POA: Diagnosis present

## 2011-02-02 DIAGNOSIS — E876 Hypokalemia: Secondary | ICD-10-CM | POA: Diagnosis not present

## 2011-02-02 DIAGNOSIS — Z86711 Personal history of pulmonary embolism: Secondary | ICD-10-CM

## 2011-02-02 DIAGNOSIS — M009 Pyogenic arthritis, unspecified: Secondary | ICD-10-CM

## 2011-02-02 DIAGNOSIS — IMO0001 Reserved for inherently not codable concepts without codable children: Secondary | ICD-10-CM | POA: Diagnosis present

## 2011-02-02 DIAGNOSIS — I4892 Unspecified atrial flutter: Secondary | ICD-10-CM

## 2011-02-02 HISTORY — DX: Gastro-esophageal reflux disease without esophagitis: K21.9

## 2011-02-02 HISTORY — DX: Disease of blood and blood-forming organs, unspecified: D75.9

## 2011-02-02 HISTORY — DX: Cardiac arrhythmia, unspecified: I49.9

## 2011-02-02 HISTORY — DX: Anxiety disorder, unspecified: F41.9

## 2011-02-02 HISTORY — DX: Pneumonia, unspecified organism: J18.9

## 2011-02-02 HISTORY — DX: Anemia, unspecified: D64.9

## 2011-02-02 HISTORY — DX: Chronic kidney disease, stage 3 unspecified: N18.30

## 2011-02-02 HISTORY — DX: Respiratory tuberculosis unspecified: A15.9

## 2011-02-02 HISTORY — DX: Chronic kidney disease, stage 3 (moderate): N18.3

## 2011-02-02 HISTORY — DX: Encounter for other specified aftercare: Z51.89

## 2011-02-02 HISTORY — DX: Mental disorder, not otherwise specified: F99

## 2011-02-02 HISTORY — DX: Heart failure, unspecified: I50.9

## 2011-02-02 HISTORY — DX: Other pulmonary embolism without acute cor pulmonale: I26.99

## 2011-02-02 HISTORY — DX: Depression, unspecified: F32.A

## 2011-02-02 HISTORY — DX: Major depressive disorder, single episode, unspecified: F32.9

## 2011-02-02 HISTORY — DX: Unspecified osteoarthritis, unspecified site: M19.90

## 2011-02-02 HISTORY — DX: Essential (primary) hypertension: I10

## 2011-02-02 HISTORY — DX: Shortness of breath: R06.02

## 2011-02-02 HISTORY — DX: Cardiac murmur, unspecified: R01.1

## 2011-02-02 LAB — DIFFERENTIAL
Basophils Absolute: 0 10*3/uL (ref 0.0–0.1)
Basophils Relative: 1 % (ref 0–1)
Eosinophils Relative: 2 % (ref 0–5)
Monocytes Absolute: 0.4 10*3/uL (ref 0.1–1.0)

## 2011-02-02 LAB — COMPREHENSIVE METABOLIC PANEL
ALT: 21 U/L (ref 0–35)
AST: 20 U/L (ref 0–37)
CO2: 28 mEq/L (ref 19–32)
Chloride: 98 mEq/L (ref 96–112)
Creatinine, Ser: 2.02 mg/dL — ABNORMAL HIGH (ref 0.50–1.10)
GFR calc Af Amer: 29 mL/min — ABNORMAL LOW (ref >90)
GFR calc non Af Amer: 25 mL/min — ABNORMAL LOW (ref >90)
Glucose, Bld: 320 mg/dL — ABNORMAL HIGH (ref 70–99)
Sodium: 137 mEq/L (ref 135–145)
Total Bilirubin: 0.4 mg/dL (ref 0.3–1.2)

## 2011-02-02 LAB — TSH: TSH: 0.666 u[IU]/mL (ref 0.350–4.500)

## 2011-02-02 LAB — CBC
Hemoglobin: 11.4 g/dL — ABNORMAL LOW (ref 12.0–15.0)
MCH: 31.4 pg (ref 26.0–34.0)
MCV: 94.2 fL (ref 78.0–100.0)
Platelets: 275 10*3/uL (ref 150–400)
RBC: 3.63 MIL/uL — ABNORMAL LOW (ref 3.87–5.11)
WBC: 5.7 10*3/uL (ref 4.0–10.5)

## 2011-02-02 LAB — CARDIAC PANEL(CRET KIN+CKTOT+MB+TROPI)
CK, MB: 4.2 ng/mL — ABNORMAL HIGH (ref 0.3–4.0)
Troponin I: <0.3 ng/mL (ref <0.30)

## 2011-02-02 LAB — GLUCOSE, CAPILLARY

## 2011-02-02 MED ORDER — INSULIN GLARGINE 100 UNIT/ML ~~LOC~~ SOLN: 60.0000 [IU] | Freq: Two times a day (BID) | SUBCUTANEOUS | Status: DC

## 2011-02-02 MED ORDER — SODIUM CHLORIDE 0.9 % IJ SOLN: 3.0000 mL | INTRAMUSCULAR | Status: DC | PRN

## 2011-02-02 MED ORDER — FUROSEMIDE 10 MG/ML IJ SOLN: 80.0000 mg | Freq: Three times a day (TID) | INTRAMUSCULAR | Status: DC

## 2011-02-02 MED ORDER — INSULIN GLARGINE 100 UNIT/ML ~~LOC~~ SOLN
60.0000 [IU] | Freq: Two times a day (BID) | SUBCUTANEOUS | Status: DC
  Administered 2011-02-02 – 2011-02-04 (×6): 60 [IU] via SUBCUTANEOUS
  Filled 2011-02-02 (×2): qty 3

## 2011-02-02 MED ORDER — METOPROLOL SUCCINATE 12.5 MG HALF TABLET: 50.0000 mg | ORAL_TABLET | Freq: Every day | ORAL | Status: DC

## 2011-02-02 MED ORDER — SODIUM CHLORIDE 0.9 % IJ SOLN
3.0000 mL | Freq: Two times a day (BID) | INTRAMUSCULAR | Status: DC
  Administered 2011-02-02 – 2011-02-06 (×9): 3 mL via INTRAVENOUS

## 2011-02-02 MED ORDER — ACETAMINOPHEN 325 MG PO TABS
650.0000 mg | ORAL_TABLET | ORAL | Status: DC | PRN
  Administered 2011-02-04 – 2011-02-05 (×2): 650 mg via ORAL
  Administered 2011-02-05: 325 mg via ORAL
  Administered 2011-02-05 – 2011-02-06 (×2): 650 mg via ORAL
  Filled 2011-02-02 (×2): qty 2
  Filled 2011-02-02: qty 1
  Filled 2011-02-02 (×3): qty 2

## 2011-02-02 MED ORDER — SODIUM CHLORIDE 0.9 % IV SOLN: 250.0000 mL | INTRAVENOUS | Status: DC

## 2011-02-02 MED ORDER — POTASSIUM CHLORIDE CRYS ER 10 MEQ PO TBCR: 20.0000 meq | EXTENDED_RELEASE_TABLET | Freq: Two times a day (BID) | ORAL | Status: DC

## 2011-02-02 MED ORDER — WARFARIN SODIUM 7.5 MG PO TABS
7.5000 mg | ORAL_TABLET | Freq: Once | ORAL | Status: AC
  Filled 2011-02-02: qty 1

## 2011-02-02 MED ORDER — FUROSEMIDE 10 MG/ML IJ SOLN
80.0000 mg | Freq: Three times a day (TID) | INTRAMUSCULAR | Status: DC
  Administered 2011-02-02 – 2011-02-03 (×4): 80 mg via INTRAVENOUS
  Administered 2011-02-04: 40 mg via INTRAVENOUS
  Administered 2011-02-04: 80 mg via INTRAVENOUS
  Filled 2011-02-02 (×8): qty 8

## 2011-02-02 MED ORDER — SERTRALINE HCL 50 MG PO TABS
50.0000 mg | ORAL_TABLET | Freq: Every day | ORAL | Status: DC
  Administered 2011-02-02 – 2011-02-07 (×6): 50 mg via ORAL
  Filled 2011-02-02 (×6): qty 1

## 2011-02-02 MED ORDER — ZOLPIDEM TARTRATE 5 MG PO TABS
10.0000 mg | ORAL_TABLET | Freq: Every evening | ORAL | Status: DC | PRN
  Administered 2011-02-02 – 2011-02-05 (×4): 10 mg via ORAL
  Filled 2011-02-02: qty 1
  Filled 2011-02-02 (×3): qty 2
  Filled 2011-02-02: qty 1

## 2011-02-02 MED ORDER — ACETAMINOPHEN 500 MG PO TABS
1000.0000 mg | ORAL_TABLET | Freq: Four times a day (QID) | ORAL | Status: DC | PRN
  Filled 2011-02-02: qty 2

## 2011-02-02 MED ORDER — HYDROCODONE-ACETAMINOPHEN 5-325 MG PO TABS
1.0000 | ORAL_TABLET | ORAL | Status: DC | PRN
  Administered 2011-02-02 – 2011-02-07 (×14): 1 via ORAL
  Filled 2011-02-02 (×14): qty 1

## 2011-02-02 MED ORDER — ACETAMINOPHEN 325 MG PO TABS: 650.0000 mg | ORAL_TABLET | ORAL | Status: DC | PRN

## 2011-02-02 MED ORDER — ROSUVASTATIN CALCIUM 10 MG PO TABS
10.0000 mg | ORAL_TABLET | Freq: Every day | ORAL | Status: DC
  Administered 2011-02-02 – 2011-02-07 (×6): 10 mg via ORAL
  Filled 2011-02-02 (×6): qty 1

## 2011-02-02 MED ORDER — GABAPENTIN 600 MG PO TABS
600.0000 mg | ORAL_TABLET | Freq: Three times a day (TID) | ORAL | Status: DC
  Administered 2011-02-02 – 2011-02-06 (×14): 600 mg via ORAL
  Filled 2011-02-02 (×17): qty 1

## 2011-02-02 MED ORDER — METOPROLOL SUCCINATE ER 50 MG PO TB24
50.0000 mg | ORAL_TABLET | Freq: Every day | ORAL | Status: DC
  Administered 2011-02-02 – 2011-02-07 (×6): 50 mg via ORAL
  Filled 2011-02-02 (×6): qty 1

## 2011-02-02 MED ORDER — WARFARIN SODIUM 7.5 MG PO TABS: 7.5000 mg | ORAL_TABLET | Freq: Every day | ORAL | Status: DC

## 2011-02-02 MED ORDER — MUPIROCIN CALCIUM 2 % EX CREA
TOPICAL_CREAM | Freq: Every day | CUTANEOUS | Status: DC
  Administered 2011-02-03 – 2011-02-06 (×4): via TOPICAL
  Administered 2011-02-07: 1 via TOPICAL
  Filled 2011-02-02: qty 15

## 2011-02-02 MED ORDER — INSULIN ASPART 100 UNIT/ML ~~LOC~~ SOLN
0.0000 [IU] | Freq: Three times a day (TID) | SUBCUTANEOUS | Status: DC
  Administered 2011-02-02: 20 [IU] via SUBCUTANEOUS
  Administered 2011-02-03 – 2011-02-04 (×4): 11 [IU] via SUBCUTANEOUS
  Administered 2011-02-04: 15 [IU] via SUBCUTANEOUS
  Administered 2011-02-04: 20 [IU] via SUBCUTANEOUS
  Administered 2011-02-05: 11 [IU] via SUBCUTANEOUS
  Administered 2011-02-05: 20 [IU] via SUBCUTANEOUS
  Administered 2011-02-05: 11 [IU] via SUBCUTANEOUS
  Administered 2011-02-05 – 2011-02-06 (×4): 20 [IU] via SUBCUTANEOUS

## 2011-02-02 MED ORDER — GABAPENTIN 100 MG PO CAPS: 300.0000 mg | ORAL_CAPSULE | Freq: Three times a day (TID) | ORAL | Status: DC

## 2011-02-02 MED ORDER — DILTIAZEM HCL ER 120 MG PO CP24
120.0000 mg | ORAL_CAPSULE | Freq: Every day | ORAL | Status: DC
  Administered 2011-02-02 – 2011-02-07 (×6): 120 mg via ORAL
  Filled 2011-02-02 (×6): qty 1

## 2011-02-02 MED ORDER — POTASSIUM CHLORIDE CRYS ER 20 MEQ PO TBCR
20.0000 meq | EXTENDED_RELEASE_TABLET | Freq: Two times a day (BID) | ORAL | Status: DC
  Administered 2011-02-02 (×2): 20 meq via ORAL
  Filled 2011-02-02 (×4): qty 1

## 2011-02-02 MED ORDER — SERTRALINE HCL 25 MG PO TABS: 50.0000 mg | ORAL_TABLET | Freq: Every day | ORAL | Status: DC

## 2011-02-02 MED ORDER — PANTOPRAZOLE SODIUM 40 MG PO TBEC
40.0000 mg | DELAYED_RELEASE_TABLET | Freq: Every day | ORAL | Status: DC
  Administered 2011-02-02 – 2011-02-07 (×6): 40 mg via ORAL
  Filled 2011-02-02 (×7): qty 1

## 2011-02-02 MED ORDER — PANTOPRAZOLE SODIUM 20 MG PO TBEC: 40.0000 mg | DELAYED_RELEASE_TABLET | Freq: Every day | ORAL | Status: DC

## 2011-02-02 MED ORDER — SODIUM CHLORIDE 0.9 % IJ SOLN: 3.0000 mL | Freq: Two times a day (BID) | INTRAMUSCULAR | Status: DC

## 2011-02-02 MED ORDER — INFLUENZA VIRUS VACC SPLIT PF IM SUSP
0.5000 mL | INTRAMUSCULAR | Status: AC
  Administered 2011-02-03: 0.5 mL via INTRAMUSCULAR
  Filled 2011-02-02: qty 0.5

## 2011-02-02 MED ORDER — INSULIN ASPART 100 UNIT/ML ~~LOC~~ SOLN
20.0000 [IU] | Freq: Two times a day (BID) | SUBCUTANEOUS | Status: DC
  Filled 2011-02-02: qty 3

## 2011-02-02 MED ORDER — SODIUM CHLORIDE 0.9 % IV SOLN: 4.0000 mg | Freq: Four times a day (QID) | INTRAVENOUS | Status: DC | PRN

## 2011-02-02 MED ORDER — DILTIAZEM HCL ER COATED BEADS 120 MG PO CP24: 120.0000 mg | ORAL_CAPSULE | Freq: Every day | ORAL | Status: DC

## 2011-02-02 NOTE — Progress Notes (Signed)
Pt arrived from the doctor's office. Pt is alert and oriented x 3 and lives home with her brother. Pt was placed on telemetry, skin is intact, and complains of some lower back and right arm pain. 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 Pt has her own cane at bedside and is a one-assist with ambulation. Will continue to monitor.

## 2011-02-02 NOTE — Progress Notes (Signed)
Spoke to Dr. Anne Fu about pt CBG being 405, and wanted to know if he wanted her to get a different amount of Novolog other than the 20 units per sliding scale. Dr. Anne Fu declined and said he just wanted her to get the 20 units and nothing additional for her CBG of 405. Will continue to monitor.

## 2011-02-02 NOTE — H&P (Signed)
Patient: [name] [name], Welke Provider: Donato Schultz, MD  DOB: [birth year] Age: 65 Y Sex: Female Date: 02/02/2011  Phone: [phone]   Address: [address] [place], [id number]  Pcp: RONALD POLITE       Subjective:     CC:    1. SOB AND LE EDEMA/OVERDUE FOLLOWUP - ADMIT.        HPI:  General:  65 year old female who was seen previously with increasing lower extremity edema and Lasix was increased to 80 mg twice a day.   Over the past 3 days has been with increased SOB. No salt. Maybe a little chest pain. Can't walk to far.   Her EF was reviewed and was normal however she does have a mild degree of diastolic dysfunction. We discussed fluid restriction and sodium restriction. Her last creatinine was 1.8 prior to today's visit.   In regards to her atrial flutter-unsuccessful ablation, prior cardioversion was performed. She has been taking 80 twice a day of Lasix. No dizziness. .        ROS:  +SOB, no fevers, +cough. No chills. Minimal CP. , The other elements of the review of systems are negative (12 total elements).       Medical History: Hypertension, Diabetes, Obesity, Atrial flutter-seen on echocardiogram 10/09, EKG 2/17 - 2-1 conduction rate 123. No PFO ECHO 2/10. Cardioversion 3/10 - sucessful, Osteoarthritis, Atrial fibrillation - lifewatch monitor 07/15/08 - rate 158. - Rythmol added - failed second cardioversion on Rythmol therapy within 4 days. - attempted atrial flutter ablation unsuccessful due to tachycardia originating in the left atrium, however successful pace terminated atrial flutter - Dr. Jeannetta Nap, Pulmonary embolism-recurrent, last 10/09 on CTA. Plan for lifelong Coumadin INR 2-2.5, septic joint r shoulder s/p I/D and IV abx x 6wk, Back pain.        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         Hospitalization/Major Diagnostic Procedure: PE, AFIB, uncontrolled DM , sepsis 2011 .        Family History: Father: deceased cancer, lung Mother: deceased dementia, hypertension, diabetes mellitus Brother 1: alive HIV Infection Brother2: alive Sister 1: alive diabetes mellitus Sister 2: alive 2 brother(s) , 2 sister(s) . 1daughter(s) .  No family history of thrombotic disorders. Grandmother on father's side died of "blood clot". sister with DM 1brother with HIV.       Social History:  General:  History of smoking cigarettes: Never smoked.  no Smoking, Prior.  no Alcohol.  no Recreational drug use.  Exercise: minimal.  Children: 1 adopted dughter.        Medications: Pantoprazole Sodium 40 mg Tablet Delayed Release 1 tablet qd, Vicodin 5-500 MG Tablet 1 tablet as needed for pain bid prn, Diltiazem HCl CR 120.0 Capsule, extended release TAKE 1 CAPSULE BY MOUTH ONCE DAILY , Warfarin Sodium 5 MG Tablet 1.5 Once a day, Lantus 100 UNIT/ML Solution 60 units bid, NovoLog 100 UNIT/ML Solution as directed , Metoprolol Succinate 100.0 Milligram Sustained Release Tablet TAKE 1/2 TABLET BY MOUTH ONCE DAILY , Potassium Chloride 10.0 Capsule, extended release TAKE 2 CAPSULES BY MOUTH DAILY , Gabapentin 600.0 Milligram Tablet TAKE 1 TABLET BY MOUTH THREE TIMES DAILY , Furosemide 80.0 Milligram Tablet TAKE 1 TABLET BY MOUTH TWICE DAILY , Sertraline HCl 50.0 Milligram Tablet TAKE 1 TABLET BY MOUTH EVERY DAY ,  Ambien 10 MG Tablet 1 tablet at bedtime Once a day, Simvastatin 40.0 Milligram Tablet TAKE 1 TABLET BY MOUTH EVERY EVENING , Meclizine HCl 25.0 Milligram Tablet TAKE 1 TABLET BY MOUTH THREE TIMES DAILY AS NEEDED , Promethazine HCl 25.0 Milligram Tablet TAKE 1 TABLET BY MOUTH EVERY 4-6 HOURS AS NEEDED , Medication List reviewed and reconciled with the patient       Allergies: Shellfish: Vomiting.       Objective:     Vitals: Wt 277, Wt change 15.4 lb, Ht 62.25, BMI 50.25, Temp 99.9, Pulse sitting  96, BP sitting 101/68.       Examination:  General Examination:  GENERAL APPEARANCE Increased respiratory effort noted at rest. Alert and oriented x3.Marland Kitchen  SKIN: normal, no rash.  HEENT: normal.  HEAD: [place]/AT.  EYES: EOMI, Conjunctiva clear.  NECK: supple, FROM, without evidence of thyromegaly, adenopathy, or bruits, no jugular venous distention (JVD).  LUNGS: Mild wheeze heard bilaterally lower lobes. Increased respiratory effort.Marland Kitchen  HEART: Irregularly irregular, soft systolic murmur left lower sternal border, normal heart rate.  ABDOMEN: soft, non-tender/non-distended, bowel sounds present, no masses palpated, no bruit.  EXTREMITIES: Increased edema bilaterally lower extremities, 2+.  NEUROLOGIC EXAM: non-focal exam, alert and oriented x 3.  PERIPHERAL PULSES: normal (2+) bilaterally.  LYMPH NODES: no cervical adenopathy.  PSYCH affect normal, anxious.        Assessment:     Assessment:  1. Acute diastolic heart failure - 428.31 (Primary)  2. Atrial fibrillation - 427.31  3. Atrial flutter - 427.32  4. SVT/PAT - 427.0  5. Diabetes mellitus without mention of complication, type 2 or unspecified type, uncontrolled - 250.02, Historically uncontrolled, check followup labs, further recommendations after review of labs  6. Hypertension, essential - 401.1, Blood pressure well controlled, continue current meds, check followup renal function and electrolytes  7. Overweight - 278.02  8. Pulmonary embolism - 415.11  9. Anticoagulant monitoring - V58.61    Plan:     1. Acute diastolic heart failure  Weight has significantly increased. The patient thinks that her symptoms are as a result of increased weight which is likely the case. We will admit with IV diuresis. Watch her blood pressure closely. I will check a chest x-ray. BNP. Monitor EKG. Telemetry. Daily weights. Hopefully with diuresis, her symptoms will improve. Her temperature was 99.6. Look for any signs of pneumonia. I have consult  with Dr. Trula Slade her primary physician to assist with complex medical management.       2. Atrial fibrillation  Diagnostic Imaging:EKG atrial flutter, rate 96, no other changes, Plummer,Wanda 02/02/2011 12:05:53 PM > Scotti Kosta 02/02/2011 01:46:26 PM >        Immunizations:        Labs:        Procedure Codes: 16109 EKG I AND R       Preventive:         Follow Up: post hospitalization      Provider: Donato Schultz, MD  Patient: Genevie, Elman DOB: 06-26-1945 Date: 02/02/2011

## 2011-02-02 NOTE — Consult Note (Signed)
ANTICOAGULATION CONSULT NOTE - Initial Consult  Pharmacy Consult for Coumadin Indication: atrial fibrillation  Allergies  Allergen Reactions  . Shellfish Allergy     REACTION: nausea, vomiting, abdominal pain    Patient Measurements: Height: 5\' 3"  (160 cm) Weight: 282 lb (127.914 kg) IBW/kg (Calculated) : 52.4  Adjusted Body Weight:   Vital Signs: Temp: 98.9 F (37.2 C) (11/08 1439) Temp src: Oral (11/08 1439) BP: 131/78 mmHg (11/08 1439) Pulse Rate: 91  (11/08 1439)  Labs:  Basename 02/02/11 1601  HGB 11.4*  HCT 34.2*  PLT 275  APTT --  LABPROT 29.2*  INR 2.71*  HEPARINUNFRC --  CREATININE 2.02*  CKTOTAL 241*  CKMB 4.2*  TROPONINI <0.30   Estimated Creatinine Clearance: 37.7 ml/min (by C-G formula based on Cr of 2.02).  Medical History: Past Medical History  Diagnosis Date  . Angina   . Heart murmur   . Shortness of breath   . Blood dyscrasia   . Mental disorder   . Hypertension   . CHF (congestive heart failure)   . 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 Blood transfusion   . GERD (gastroesophageal reflux disease)   . Arthritis   . Anxiety   . Dysrhythmia   . Pneumonia   . Diabetes mellitus   . Anemia   . Depression     Medications:  Prescriptions prior to admission  Medication Sig Dispense Refill  . acetaminophen (TYLENOL) 500 MG tablet Take 1,000 mg by mouth every 6 (six) hours as needed. For pain       . diltiazem (DILACOR XR) 120 MG 24 hr capsule Take 120 mg by mouth daily.        . furosemide (LASIX) 80 MG tablet Take 80 mg by mouth 2 (two) times daily.        Marland Kitchen gabapentin (NEURONTIN) 600 MG tablet Take 600 mg by mouth 3 (three) times daily.        Marland Kitchen HYDROcodone-acetaminophen (VICODIN) 5-500 MG per tablet Take 1 tablet by mouth 2 (two) times daily as needed. For pain       . insulin aspart (NOVOLOG) 100 UNIT/ML injection Inject 20 Units into the skin 2 (two) times daily. Per sliding scale       . insulin glargine (LANTUS) 100 UNIT/ML  injection Inject 60 Units into the skin 2 (two) times daily.       . meclizine (ANTIVERT) 25 MG tablet Take 25 mg by mouth 3 (three) times daily as needed. For dizziness      . metoprolol (TOPROL-XL) 100 MG 24 hr tablet Take 50 mg by mouth daily.        . pantoprazole (PROTONIX) 40 MG tablet Take 40 mg by mouth daily.        . potassium chloride (KLOR-CON) 10 MEQ CR tablet Take 20 mEq by mouth daily.        . promethazine (PHENERGAN) 25 MG tablet Take 25 mg by mouth every 4 (four) hours as needed. For nausea and vomiting      . sertraline (ZOLOFT) 50 MG tablet Take 50 mg by mouth daily.        . simvastatin (ZOCOR) 40 MG tablet Take 40 mg by mouth at bedtime.        Marland Kitchen warfarin (COUMADIN) 5 MG tablet Take 7.5 mg by mouth daily.        Marland Kitchen zolpidem (AMBIEN) 10 MG tablet Take 10 mg by mouth at bedtime as needed. For  sleep       . DISCONTD: diltiazem (TIAZAC) 120 MG 24 hr capsule Take 120 mg by mouth daily.        Marland Kitchen DISCONTD: mupirocin (BACTROBAN) 2 % cream Apply to inside of nostrils twice daily for 7 days prior to surgery  30 g  0  . DISCONTD: oxycodone (OXY-IR) 5 MG capsule Take 5-10 mg by mouth every 4 (four) hours as needed. For pain      . DISCONTD: warfarin (COUMADIN) 5 MG tablet Take 5 mg by mouth as directed.         Scheduled:    . diltiazem  120 mg Oral Daily  . furosemide  80 mg Intravenous Q8H  . gabapentin  600 mg Oral TID  . influenza  inactive virus vaccine  0.5 mL Intramuscular Tomorrow-1000  . insulin aspart  0-20 Units Subcutaneous TID WC  . insulin glargine  60 Units Subcutaneous BID  . metoprolol  50 mg Oral Daily  . mupirocin   Topical Daily  . pantoprazole  40 mg Oral Daily  . potassium chloride  20 mEq Oral BID  . rosuvastatin  10 mg Oral Daily  . sertraline  50 mg Oral Daily  . sodium chloride  3 mL Intravenous Q12H  . warfarin  7.5 mg Oral Daily  . DISCONTD: insulin aspart  20 Units Subcutaneous BID    Assessment: 65 yo who was admitted for HF. She has been on  chronic coumadin at home for afib. Her admission INR is 2.7. Plan to continue coumadin while she is here. Goal of Therapy:  INR 2-3   Plan:  Coumadin 7.5mg  x1 today per MD Daily PT/INR  Ulyses Southward [facility] 02/02/2011,5:21 PM

## 2011-02-03 ENCOUNTER — Encounter (HOSPITAL_COMMUNITY): Payer: Self-pay | Admitting: Internal Medicine

## 2011-02-03 DIAGNOSIS — Z7901 Long term (current) use of anticoagulants: Secondary | ICD-10-CM

## 2011-02-03 LAB — CARDIAC PANEL(CRET KIN+CKTOT+MB+TROPI)
CK, MB: 3.7 ng/mL (ref 0.3–4.0)
CK, MB: 3.3 ng/mL (ref 0.3–4.0)
Total CK: 184 U/L — ABNORMAL HIGH (ref 7–177)
Troponin I: <0.3 ng/mL (ref <0.30)

## 2011-02-03 LAB — HEMOGLOBIN A1C
Hgb A1c MFr Bld: 11 % — ABNORMAL HIGH (ref <5.7)
Mean Plasma Glucose: 269 mg/dL — ABNORMAL HIGH (ref <117)

## 2011-02-03 LAB — PROTIME-INR
INR: 2.76 — ABNORMAL HIGH (ref 0.00–1.49)
Prothrombin Time: 29.6 seconds — ABNORMAL HIGH (ref 11.6–15.2)

## 2011-02-03 LAB — GLUCOSE, CAPILLARY
Glucose-Capillary: 162 mg/dL — ABNORMAL HIGH (ref 70–99)
Glucose-Capillary: 274 mg/dL — ABNORMAL HIGH (ref 70–99)

## 2011-02-03 LAB — BASIC METABOLIC PANEL
BUN: 36 mg/dL — ABNORMAL HIGH (ref 6–23)
GFR calc Af Amer: 34 mL/min — ABNORMAL LOW (ref >90)
GFR calc non Af Amer: 29 mL/min — ABNORMAL LOW (ref >90)
Potassium: 3.4 mEq/L — ABNORMAL LOW (ref 3.5–5.1)
Sodium: 143 mEq/L (ref 135–145)

## 2011-02-03 MED ORDER — METOLAZONE 2.5 MG PO TABS
2.5000 mg | ORAL_TABLET | Freq: Every day | ORAL | Status: AC
  Administered 2011-02-03: 2.5 mg via ORAL
  Filled 2011-02-03: qty 1

## 2011-02-03 MED ORDER — POTASSIUM CHLORIDE CRYS ER 20 MEQ PO TBCR
40.0000 meq | EXTENDED_RELEASE_TABLET | Freq: Two times a day (BID) | ORAL | Status: DC
  Administered 2011-02-03 – 2011-02-04 (×3): 40 meq via ORAL
  Administered 2011-02-04: 20 meq via ORAL
  Filled 2011-02-03 (×5): qty 2

## 2011-02-03 MED ORDER — WARFARIN SODIUM 7.5 MG PO TABS
7.5000 mg | ORAL_TABLET | Freq: Once | ORAL | Status: AC
  Administered 2011-02-03: 7.5 mg via ORAL
  Filled 2011-02-03: qty 1

## 2011-02-03 NOTE — Progress Notes (Signed)
Clinical Social Worker completed the psychosocial assessment which can be found in the shadow chart. Clinical Social Work will sign off for now as social work intervention is no longer needed. Please consult us again if need arises.   

## 2011-02-03 NOTE — Progress Notes (Addendum)
  Echocardiogram 2D Echocardiogram has been performed.  Dewitt Hoes, RDCS 02/03/2011, 9:19 AM

## 2011-02-03 NOTE — Progress Notes (Signed)
ANTICOAGULATION CONSULT NOTE - Follow Up Consult  Pharmacy Consult for Coumadin Indication: atrial fibrillation  Allergies  Allergen Reactions  . Shellfish Allergy     REACTION: nausea, vomiting, abdominal pain    Patient Measurements: Height: 5\' 3"  (160 cm) Weight: 293 lb 10.4 oz (133.2 kg) IBW/kg (Calculated) : 52.4    Vital Signs: Temp: 97 F (36.1 C) (11/09 0500) Temp src: Oral (11/09 0500) BP: 124/82 mmHg (11/09 0500) Pulse Rate: 74  (11/09 0500)  Labs:  Basename 02/03/11 0515 02/02/11 2323 02/02/11 1601  HGB -- -- 11.4*  HCT -- -- 34.2*  PLT -- -- 275  APTT -- -- --  LABPROT 29.6* -- 29.2*  INR 2.76* -- 2.71*  HEPARINUNFRC -- -- --  CREATININE 1.80* -- 2.02*  CKTOTAL -- 213* 241*  CKMB -- 3.7 4.2*  TROPONINI -- <0.30 <0.30     Assessment: 65 year old female on Coumadin PTA for Afib.  INR therapeutic this AM at 2.76 Goal of Therapy:  INR 2-3   Plan:  Coumadin 7.5 mg po x 1 dose today Daily PT/INR  Elwin Sleight 02/03/2011,9:31 AM

## 2011-02-03 NOTE — Progress Notes (Signed)
Physical Therapy Evaluation Patient Details Name: [name] [name] MRN: [id number] DOB: July 16, 1945 Today's Date: 02/03/2011  Problem List:  Patient Active Problem List  Diagnoses  . OTITIS EXTERNA DUE TO HERPES ZOSTER  . DIABETES MELLITUS, TYPE II, ON INSULIN  . DIABETIC PERIPHERAL NEUROPATHY  . HYPERLIPIDEMIA  . Morbid obesity  . ANEMIA  . DEPRESSION  . HYPERTENSION  . PULMONARY EMBOLISM  . ATRIAL FLUTTER, CHRONIC  . GERD  . RENAL INSUFFICIENCY, CHRONIC  . PYOGENIC ARTHRITIS, SHOULDER REGION  . OSTEOARTHRITIS  . OSTEOMYELITIS  . INTERMITTENT VERTIGO  . HERPES ZOSTER  . CANDIDIASIS OF VULVA AND VAGINA  . Acute on chronic diastolic heart failure  . Encounter for [place]-term (current) use of anticoagulants    Past Medical History:  Past Medical History  Diagnosis Date  . Angina   . Heart murmur   . Shortness of breath   . Blood dyscrasia   . Mental disorder   . Hypertension   . CHF (congestive heart failure)   . 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 Blood transfusion   . GERD (gastroesophageal reflux disease)   . Arthritis   . Anxiety   . Dysrhythmia   . Pneumonia   . Diabetes mellitus   . Anemia   . Depression   . Chronic kidney disease, stage III (moderate)   . Pulmonary embolism     on lifelong coumadin   Past Surgical History:  Past Surgical History  Procedure Date  . Vascular surgery     vein removed in legs  . Dilation and curettage of uterus   . Joint replacement     both knees  . Tubal ligation   . Shoulder surgery     right shoulder I& D secondary to septic joint    PT Assessment/Plan/Recommendation PT Assessment Clinical Impression Statement: 65 y.o. female admitted to [facility] with uncontrolled DM, HTN, dyspnea who presents with decreased strength, balance, activity tolerance and safety.  Patient would benefit from skilled PT to maximize functional mobility, independence, and safety before returning home with brother's assistance at discharge.     PT Recommendation/Assessment: Patient will need skilled PT in the acute care venue PT Problem List: Decreased strength;Decreased activity tolerance;Decreased balance;Decreased mobility;Decreased knowledge of use of DME PT Therapy Diagnosis : Difficulty walking;Abnormality of gait;Generalized weakness;Other (comment) (chronic pain) PT Plan PT Frequency: Min 3X/week PT Treatment/Interventions: DME instruction;Gait training;Stair training;Functional mobility training;Therapeutic exercise;Balance training;Neuromuscular re-education;Patient/family education PT Recommendation Follow Up Recommendations: Home health PT, HH aide Equipment Recommended: Other (comment) (shower seat with back) PT Goals  Acute Rehab PT Goals PT Goal Formulation: With patient Time For Goal Achievement: 2 weeks Pt will go Supine/Side to Sit: Independently;with HOB 0 degrees PT Goal: Supine/Side to Sit - Progress: Progressing toward goal Pt will go Sit to Supine/Side: Independently;with HOB 0 degrees PT Goal: Sit to Supine/Side - Progress: Progressing toward goal Pt will Transfer Bed to Chair/Chair to Bed: with modified independence PT Transfer Goal: Bed to Chair/Chair to Bed - Progress: Other (comment) (not tested today) Pt will Ambulate: >150 feet;with modified independence;with cane;Other (comment) (with small based quad cane) PT Goal: Ambulate - Progress: Progressing toward goal Pt will Go Up / Down Stairs: 1-2 stairs;with modified independence;with cane;Other (comment) (with small based quad cane) PT Goal: Up/Down Stairs - Progress: Other (comment) (not tested)  PT Evaluation Precautions/Restrictions    Prior Functioning  Home Living Lives With: Family;Other (Comment) (brother both are on disability.  ) Receives Help From: Family  Type of Home: House Home Layout: One level Home Access: Stairs to enter [employer] of Steps: 1 Bathroom Shower/Tub: Walk-in shower;Door Home Adaptive Equipment: Quad  cane;Shower chair with back;Walker - four wheeled Additional Comments: patient's shower chair was her mom's and is rocking/unstable.  Patient would like to get a new one.   Prior Function Level of Independence: Independent with basic ADLs;Requires assistive device for independence;Other (comment) Able to Take Stairs?: Yes Driving: Yes Vocation: Other (comment) (retired.  ) Cognition   Sensation/Coordination   Extremity Assessment RLE Assessment RLE Assessment: Exceptions to [facility] RLE Strength RLE Overall Strength: Deficits RLE Overall Strength Comments: grossly 3/5 per functional assessment.   LLE Assessment LLE Assessment: Exceptions to [facility] LLE Strength LLE Overall Strength: Deficits LLE Overall Strength Comments: grossly 3/5 per functional assessment.   Mobility (including Balance) Bed Mobility Bed Mobility: Yes Supine to Sit: 6: Modified independent (Device/Increase time);With rails;HOB elevated (Comment degrees) (HOB 45 degrees) Sitting - Scoot to Edge of Bed: 6: Modified independent (Device/Increase time) Sit to Supine - Right: 6: Modified independent (Device/Increase time);With rail;HOB elevated (comment degrees) (HOB 45 degrees) Ambulation/Gait Ambulation/Gait: Yes Ambulation/Gait Assistance: 4: Min assist Ambulation/Gait Assistance Details (indicate cue type and reason): min assist for two LOB in the hallway.  Verbal cues for correct use of quad cane (put all four feet flat).   Ambulation Distance (Feet): 150 Feet Assistive device: Small based quad cane Gait Pattern: Antalgic    Exercise    End of Session PT - End of Session Equipment Utilized During Treatment: Other (comment) (small base quad cane) Activity Tolerance: Patient limited by pain Patient left: in bed;Other (comment) (seated EOB) Nurse Communication: Other (comment) (patient requesting an air mattress overlay to help with back) General Behavior During Session: [facility] for tasks performed Cognition: [facility] for  tasks performed  Anne-Marie Genson B. Tailor Westfall, PT, DPT [phone]  02/03/2011, 4:46 PM

## 2011-02-03 NOTE — Progress Notes (Addendum)
Inpatient Diabetes Program Recommendations  AACE/ADA: New Consensus Statement on Inpatient Glycemic Control (2009)  Target Ranges:  Prepandial:   less than 140 mg/dL      Peak postprandial:   less than 180 mg/dL (1-2 hours)      Critically ill patients:  140 - 180 mg/dL   Reason for Visit: CBG's greater then goal.  Inpatient Diabetes Program Recommendations Insulin - Meal Coverage: Add Novolog meal coverage 10 units with meals. Outpatient Referral: A1c is 11.1 indicating poor control of diabetes pre-hospitalization.    Note: Spoke to patient briefly.  She states Dr. Nehemiah Settle manages her diabetes. May benefit from further outpatient diabetes education.

## 2011-02-03 NOTE — Progress Notes (Signed)
Pt provided with [facility] agency list and had [facility] in the past for [facility]. Pt request [facility] if d/c home with HH. She has RW and cane at home. States she has a scale but it is faulty. Requesting assistance with scale. CM will evaluate if pt qualifies to receive from scale.

## 2011-02-03 NOTE — Consult Note (Addendum)
Reason for Consult:Medical management Referring Physician: Dr. Ronnette Hila Arenivas is an 65 y.o. female.  HPI: Patient was admitted by cardiology for complaints of dyspnea weight gain felt to be related to diastolic dysfunction. Patient states she's been taking her medications as prescribed, historically she's had some issues with compliance with her medications for diabetes but generally takes her diuretics as prescribed. She typically consumes a diet high in sodium. There was no complaint of chest pain but some dyspnea. Her weight is up markedly. She has been under some stress, there has been some deaths in the family. She denies missing her medication as a result of this. Because of her progressive dyspnea weight gain admission was deemed necessary for further evaluation and treatment. Because of her multiple comorbidities medicine was consulted  Past Medical History  Diagnosis Date  . Angina   . Heart murmur   . Shortness of breath   . Blood dyscrasia   . Mental disorder   . Hypertension   . CHF (congestive heart failure)   . 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 Blood transfusion   . GERD (gastroesophageal reflux disease)   . Arthritis   . Anxiety   . Dysrhythmia   . Pneumonia   . Diabetes mellitus   . Anemia   . Depression   . Chronic kidney disease, stage III (moderate)   . 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 Vascular surgery     vein removed in legs  . Dilation and curettage of uterus   . Joint replacement     both knees  . Tubal ligation     No family history on file.  Social History:  reports that she has never smoked. She has never used smokeless tobacco. She reports that she does not drink alcohol or use illicit drugs.  Allergies:  Allergies  Allergen Reactions  . Shellfish Allergy     REACTION: nausea, vomiting, abdominal pain    Medications: I have reviewed the patient's current  medications.  Results for orders placed during the hospital encounter of 02/02/11 (from the past 48 hour(s))  GLUCOSE, CAPILLARY     Status: Abnormal   Collection Time   02/02/11  3:35 PM      Component Value Range Comment   Glucose-Capillary 308 (*) 70 - 99 (mg/dL)    Comment 1 Notify RN     PROTIME-INR     Status: Abnormal   Collection Time   02/02/11  4:01 PM      Component Value Range Comment   Prothrombin Time 29.2 (*) 11.6 - 15.2 (seconds)    INR 2.71 (*) 0.00 - 1.49    CARDIAC PANEL(CRET KIN+CKTOT+MB+TROPI)     Status: Abnormal   Collection Time   02/02/11  4:01 PM      Component Value Range Comment   Total CK 241 (*) 7 - 177 (U/L)    CK, MB 4.2 (*) 0.3 - 4.0 (ng/mL)    Troponin I <0.30  <0.30 (ng/mL)    Relative Index 1.7  0.0 - 2.5    CBC     Status: Abnormal   Collection Time   02/02/11  4:01 PM      Component Value Range Comment   WBC 5.7  4.0 - 10.5 (K/uL)    RBC 3.63 (*) 3.87 - 5.11 (MIL/uL)    Hemoglobin 11.4 (*) 12.0 - 15.0 (  g/dL)    HCT 16.1 (*) 09.6 - 46.0 (%)    MCV 94.2  78.0 - 100.0 (fL)    MCH 31.4  26.0 - 34.0 (pg)    MCHC 33.3  30.0 - 36.0 (g/dL)    RDW 04.5  40.9 - 81.1 (%)    Platelets 275  150 - 400 (K/uL)   COMPREHENSIVE METABOLIC PANEL     Status: Abnormal   Collection Time   02/02/11  4:01 PM      Component Value Range Comment   Sodium 137  135 - 145 (mEq/L)    Potassium 3.5  3.5 - 5.1 (mEq/L)    Chloride 98  96 - 112 (mEq/L)    CO2 28  19 - 32 (mEq/L)    Glucose, Bld 320 (*) 70 - 99 (mg/dL)    BUN 36 (*) 6 - 23 (mg/dL)    Creatinine, Ser 9.14 (*) 0.50 - 1.10 (mg/dL)    Calcium 8.7  8.4 - 10.5 (mg/dL)    Total Protein 7.3  6.0 - 8.3 (g/dL)    Albumin 3.4 (*) 3.5 - 5.2 (g/dL)    AST 20  0 - 37 (U/L)    ALT 21  0 - 35 (U/L)    Alkaline Phosphatase 110  39 - 117 (U/L)    Total Bilirubin 0.4  0.3 - 1.2 (mg/dL)    GFR calc non Af Amer 25 (*) >90 (mL/min)    GFR calc Af Amer 29 (*) >90 (mL/min)   DIFFERENTIAL     Status: Normal   Collection  Time   02/02/11  4:01 PM      Component Value Range Comment   Neutrophils Relative 48  43 - 77 (%)    Neutro Abs 2.7  1.7 - 7.7 (K/uL)    Lymphocytes Relative 43  12 - 46 (%)    Lymphs Abs 2.5  0.7 - 4.0 (K/uL)    Monocytes Relative 7  3 - 12 (%)    Monocytes Absolute 0.4  0.1 - 1.0 (K/uL)    Eosinophils Relative 2  0 - 5 (%)    Eosinophils Absolute 0.1  0.0 - 0.7 (K/uL)    Basophils Relative 1  0 - 1 (%)    Basophils Absolute 0.0  0.0 - 0.1 (K/uL)   TSH     Status: Normal   Collection Time   02/02/11  4:01 PM      Component Value Range Comment   TSH 0.666  0.350 - 4.500 (uIU/mL)   GLUCOSE, CAPILLARY     Status: Abnormal   Collection Time   02/02/11  4:35 PM      Component Value Range Comment   Glucose-Capillary 405 (*) 70 - 99 (mg/dL)    Comment 1 Notify RN     HEMOGLOBIN A1C     Status: Abnormal   Collection Time   02/02/11  4:49 PM      Component Value Range Comment   Hemoglobin A1C 11.1 (*) <5.7 (%)    Mean Plasma Glucose 272 (*) <117 (mg/dL)   GLUCOSE, CAPILLARY     Status: Abnormal   Collection Time   02/02/11  9:20 PM      Component Value Range Comment   Glucose-Capillary 284 (*) 70 - 99 (mg/dL)   CARDIAC PANEL(CRET KIN+CKTOT+MB+TROPI)     Status: Abnormal   Collection Time   02/02/11 11:23 PM      Component Value Range Comment   Total CK 213 (*)  7 - 177 (U/L)    CK, MB 3.7  0.3 - 4.0 (ng/mL)    Troponin I <0.30  <0.30 (ng/mL)    Relative Index 1.7  0.0 - 2.5    PROTIME-INR     Status: Abnormal   Collection Time   02/03/11  5:15 AM      Component Value Range Comment   Prothrombin Time 29.6 (*) 11.6 - 15.2 (seconds)    INR 2.76 (*) 0.00 - 1.49    BASIC METABOLIC PANEL     Status: Abnormal   Collection Time   02/03/11  5:15 AM      Component Value Range Comment   Sodium 143  135 - 145 (mEq/L)    Potassium 3.4 (*) 3.5 - 5.1 (mEq/L)    Chloride 102  96 - 112 (mEq/L)    CO2 27  19 - 32 (mEq/L)    Glucose, Bld 162 (*) 70 - 99 (mg/dL)    BUN 36 (*) 6 - 23 (mg/dL)     Creatinine, Ser 0.45 (*) 0.50 - 1.10 (mg/dL)    Calcium 8.6  8.4 - 10.5 (mg/dL)    GFR calc non Af Amer 29 (*) >90 (mL/min)    GFR calc Af Amer 34 (*) >90 (mL/min)   GLUCOSE, CAPILLARY     Status: Abnormal   Collection Time   02/03/11  7:34 AM      Component Value Range Comment   Glucose-Capillary 162 (*) 70 - 99 (mg/dL)    Comment 1 Notify RN     CARDIAC PANEL(CRET KIN+CKTOT+MB+TROPI)     Status: Abnormal   Collection Time   02/03/11  8:00 AM      Component Value Range Comment   Total CK 184 (*) 7 - 177 (U/L)    CK, MB 3.3  0.3 - 4.0 (ng/mL)    Troponin I <0.30  <0.30 (ng/mL)    Relative Index 1.8  0.0 - 2.5    GLUCOSE, CAPILLARY     Status: Abnormal   Collection Time   02/03/11 11:53 AM      Component Value Range Comment   Glucose-Capillary 274 (*) 70 - 99 (mg/dL)    Comment 1 Notify RN       Dg Chest 2 View  02/02/2011  *RADIOLOGY REPORT*  Clinical Data: Dyspnea, diabetes  CHEST - 2 VIEW  Comparison: 06/17/2010  Findings: Vascular clips in the left upper abdomen.  Stable mild cardiomegaly.  Lungs clear.  No effusion.  Minimal spurring in the thoracic spine. 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  IMPRESSION:  1.  Stable mild cardiomegaly.  No acute disease.  Original Report Authenticated By: Osa Craver, M.D.    Review of Systems  Constitutional: Positive for malaise/fatigue. Negative for fever, chills, weight loss and diaphoresis.  HENT: Negative for hearing loss, ear pain, nosebleeds, congestion, sore throat, tinnitus and ear discharge.   Eyes: Negative for blurred vision, double vision, photophobia, pain, discharge and redness.  Respiratory: Negative for cough, hemoptysis, sputum production, shortness of breath, wheezing and stridor.   Cardiovascular: Positive for orthopnea, leg swelling and PND. Negative for chest pain, palpitations and claudication.  Gastrointestinal: Negative for heartburn, nausea, vomiting, abdominal pain, diarrhea, constipation, blood in stool  and melena.  Genitourinary: Negative.   Musculoskeletal: Positive for joint pain.  Skin: Negative for itching and rash.  Neurological: Positive for weakness. Negative for dizziness, tingling, tremors and headaches.   Blood pressure 118/79, pulse 65, temperature 97 F (36.1  C), temperature source Oral, resp. rate 18, height 5\' 3"  (1.6 m), weight 133.2 kg (293 lb 10.4 oz), SpO2 97.00%. Physical Exam  Constitutional: She appears well-developed.  HENT:  Head: Normocephalic.  Eyes: Conjunctivae and EOM are normal. Pupils are equal, round, and reactive to light.  Neck: Normal range of motion. No tracheal deviation present. No thyromegaly present.  Cardiovascular: Regular rhythm.   Respiratory: Breath sounds normal. No stridor.  GI: Soft. Bowel sounds are normal.  Neurological: She is alert.  Skin: Skin is warm. No rash noted. No erythema.    Assessment/Plan: Diabetes, historically on control, last A1c outpatient 13.8, for now check followup A1c, continue sliding scale insulin, carbohydrate modified diet will be started Hypertension continue current meds Depression, continue current medications History of chronic pain, continue meds Dyspnea probably multifactorial in etiology secondary to her obesity, diastolic dysfunction, cannot exclude a component of pulmonary hypertension, underlying arrhythmia. Will check chest x-ray, BNP, I would defer to cardiology in reference to to the echo. We will continue aggressive diuresis. Chronic kidney disease, current creatinine appears to be at baseline we will avoid NSAIDs and renal dose all medications Samary Shatz D 02/03/2011, 12:50 PM

## 2011-02-03 NOTE — Progress Notes (Signed)
  Subjective:  65 year old with morbid obesity, uncontrolled DM, rate controlled atrial flutter, prior PE on chronic anticoagulation here with acute on chronic diastolic HF.   Feeling better with lasix but not urinating as much as I would like. Decreased SOB. No CP. Chronic shoulder pain. Increased social stressors.   Objective:  Vital Signs in the last 24 hours: Temp:  [97 F (36.1 C)-98.9 F (37.2 C)] 97 F (36.1 C) (11/09 0500) Pulse Rate:  [74-91] 74  (11/09 0500) Resp:  [18] 18  (11/09 0500) BP: (124-145)/(78-91) 124/82 mmHg (11/09 0500) SpO2:  [95 %-100 %] 97 % (11/09 0500) Weight:  [127.914 kg (282 lb)-133.2 kg (293 lb 10.4 oz)] 293 lb 10.4 oz (133.2 kg) (11/09 0617)  Intake/Output from previous day: 11/08 0701 - 11/09 0700 In: 243 [P.O.:240; I.V.:3] Out: 100 [Urine:100]  ?validity of UOP. Weight is down  Physical Exam: General: Well developed, well nourished, in no acute distress. Obese Head:  Normocephalic and atraumatic. Lungs: Clear to auscultation and percussion. Minor wheeze BLL Heart: Normal S1 and S2.  No murmur, rubs or gallops.  Pulses: Pulses normal in all 4 extremities. Extremities: No clubbing or cyanosis. Trace edema. Neurologic: Alert and oriented x 3.    Lab Results:  [facility] 02/02/11 1601  WBC 5.7  HGB 11.4*  PLT 275    Basename 02/03/11 0515 02/02/11 1601  NA 143 137  K 3.4* 3.5  CL 102 98  CO2 27 28  GLUCOSE 162* 320*  BUN 36* 36*  CREATININE 1.80* 2.02*    Basename 02/02/11 2323 02/02/11 1601  TROPONINI <0.30 <0.30   Hepatic Function Panel  Basename 02/02/11 1601  PROT 7.3  ALBUMIN 3.4*  AST 20  ALT 21  ALKPHOS 110  BILITOT 0.4  BILIDIR --  IBILI --   No results found for this basename: CHOL in the last 72 hours No results found for this basename: PROTIME in the last 72 hours  Imaging: Dg Chest 2 View  02/02/2011  *RADIOLOGY REPORT*  Clinical Data: Dyspnea, diabetes  CHEST - 2 VIEW  Comparison: 06/17/2010  Findings:  Vascular clips in the left upper abdomen.  Stable mild cardiomegaly.  Lungs clear.  No effusion.  Minimal spurring in the thoracic spine. 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  IMPRESSION:  1.  Stable mild cardiomegaly.  No acute disease.  Original Report Authenticated By: Osa Craver, M.D.    EKG:  On admit A flutter rate controlled  Cardiac Studies:  ECHO pending  Assessment/Plan:  Principal Problem:   *Acute on chronic diastolic heart failure - Continue with IV lasix. Doubt validity of I/O. Weight is down.  I will give her metolazone 2.5mg  x 1. Not as much UOP as I would like.   Active Problems:  HYPERLIPIDEMIA - continue statin   Morbid obesity - discussed the importance of weight loss   HYPERTENSION - controlled   ATRIAL FLUTTER, CHRONIC - controlled with metop and dilt   RENAL INSUFFICIENCY, CHRONIC - creat from 2.0 to 1.8 after initial diuresis. Monitor, no ARB/ACE   Encounter for [place]-term (current) use of anticoagulants - coumadin, Tx. Appreciate pharmacy.      [name] [name] 02/03/2011, 6:41 AM

## 2011-02-04 LAB — GLUCOSE, CAPILLARY
Glucose-Capillary: 379 mg/dL — ABNORMAL HIGH (ref 70–99)
Glucose-Capillary: 347 mg/dL — ABNORMAL HIGH (ref 70–99)
Glucose-Capillary: 367 mg/dL — ABNORMAL HIGH (ref 70–99)

## 2011-02-04 LAB — BASIC METABOLIC PANEL
BUN: 42 mg/dL — ABNORMAL HIGH (ref 6–23)
CO2: 27 mEq/L (ref 19–32)
Calcium: 8.7 mg/dL (ref 8.4–10.5)
Glucose, Bld: 313 mg/dL — ABNORMAL HIGH (ref 70–99)
Sodium: 137 mEq/L (ref 135–145)

## 2011-02-04 MED ORDER — FUROSEMIDE 10 MG/ML IJ SOLN
120.0000 mg | Freq: Three times a day (TID) | INTRAMUSCULAR | Status: DC
  Administered 2011-02-04 – 2011-02-06 (×5): 120 mg via INTRAVENOUS
  Filled 2011-02-04 (×7): qty 12

## 2011-02-04 MED ORDER — WARFARIN SODIUM 7.5 MG PO TABS
7.5000 mg | ORAL_TABLET | Freq: Once | ORAL | Status: AC
  Administered 2011-02-04: 7.5 mg via ORAL
  Filled 2011-02-04: qty 1

## 2011-02-04 NOTE — Progress Notes (Signed)
Subjective: Patient is without any new complaints of dyspnea, there's no chest pain, patient still appears to be consuming excessive fluids, I does have been reviewed. She's been encouraged to try to limit her fluids to at least 1500 cc. Review of CBGs, continues to show that the elevated.  Objective: Vital signs in last 24 hours: Temp:  [97.9 F (36.6 C)-98 F (36.7 C)] 97.9 F (36.6 C) (11/10 0500) Pulse Rate:  [63-85] 85  (11/10 0500) Resp:  [18] 18  (11/10 0500) BP: (98-119)/(63-82) 98/63 mmHg (11/10 0500) SpO2:  [96 %-98 %] 98 % (11/10 0500) Weight:  [130.2 kg (287 lb 0.6 oz)] 287 lb 0.6 oz (130.2 kg) (11/10 0500) Weight change: 2.286 kg (5 lb 0.6 oz) Last BM Date: 02/02/11  Intake/Output from previous day: 11/09 0701 - 11/10 0700 In: 1080 [P.O.:1080] Out: 450 [Urine:450] Intake/Output this shift: Total I/O In: 240 [P.O.:240] Out: -   HEENT exam unremarkable Chest clear without rales rhonchi or rub Cardiovascular regular Extremities no edema  Lab Results:  [facility] 02/02/11 1601  WBC 5.7  HGB 11.4*  HCT 34.2*  PLT 275   BMET  Basename 02/04/11 0600 02/03/11 0515  NA 137 143  K 3.2* 3.4*  CL 96 102  CO2 27 27  GLUCOSE 313* 162*  BUN 42* 36*  CREATININE 1.89* 1.80*  CALCIUM 8.7 8.6    Studies/Results: Dg Chest 2 View  02/02/2011  *RADIOLOGY REPORT*  Clinical Data: Dyspnea, diabetes  CHEST - 2 VIEW  Comparison: 06/17/2010  Findings: Vascular clips in the left upper abdomen.  Stable mild cardiomegaly.  Lungs clear.  No effusion.  Minimal spurring in the thoracic spine. 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  IMPRESSION:  1.  Stable mild cardiomegaly.  No acute disease.  Original Report Authenticated By: Osa Craver, M.D.    Medications:  Scheduled:   . diltiazem  120 mg Oral Daily  . furosemide  80 mg Intravenous Q8H  . gabapentin  600 mg Oral TID  . insulin aspart  0-20 Units Subcutaneous TID WC  . insulin glargine  60 Units Subcutaneous  BID  . metolazone  2.5 mg Oral Daily  . metoprolol  50 mg Oral Daily  . mupirocin   Topical Daily  . pantoprazole  40 mg Oral Daily  . potassium chloride  40 mEq Oral BID  . rosuvastatin  10 mg Oral Daily  . sertraline  50 mg Oral Daily  . sodium chloride  3 mL Intravenous Q12H  . warfarin  7.5 mg Oral ONCE-1800  . warfarin  7.5 mg Oral ONCE-1800    Assessment/Plan: Principal Problem:  *Acute on chronic diastolic heart failure Active Problems:  HYPERLIPIDEMIA  Morbid obesity  HYPERTENSION  ATRIAL FLUTTER, CHRONIC  RENAL INSUFFICIENCY, CHRONIC  Encounter for [place]-term (current) use of anticoagulants  Hypokalemia  At this time we will continue diuresis. Replete potassium Better control of diabetes    LOS: 2 days   Khanh Cordner D 02/04/2011, 10:53 AM

## 2011-02-04 NOTE — Progress Notes (Signed)
ANTICOAGULATION CONSULT NOTE - Follow Up Consult  Pharmacy Consult for Coumadin Indication: atrial fibrillation and Hx PE  Allergies  Allergen Reactions  . Shellfish Allergy     REACTION: nausea, vomiting, abdominal pain    Patient Measurements: Height: 5\' 3"  (160 cm) Weight: 287 lb 0.6 oz (130.2 kg) IBW/kg (Calculated) : 52.4    Vital Signs: Temp: 97.9 F (36.6 C) (11/10 0500) Temp src: Oral (11/10 0500) BP: 98/63 mmHg (11/10 0500) Pulse Rate: 85  (11/10 0500)  Labs:  Basename 02/04/11 0600 02/03/11 0800 02/03/11 0515 02/02/11 2323 02/02/11 1601  HGB -- -- -- -- 11.4*  HCT -- -- -- -- 34.2*  PLT -- -- -- -- 275  APTT -- -- -- -- --  LABPROT 26.8* -- 29.6* -- 29.2*  INR 2.43* -- 2.76* -- 2.71*  HEPARINUNFRC -- -- -- -- --  CREATININE 1.89* -- 1.80* -- 2.02*  CKTOTAL -- 184* -- 213* 241*  CKMB -- 3.3 -- 3.7 4.2*  TROPONINI -- <0.30 -- <0.30 <0.30   Estimated Creatinine Clearance: 40.7 ml/min (by C-G formula based on Cr of 1.89).   Medications:  Prescriptions prior to admission  Medication Sig Dispense Refill  . acetaminophen (TYLENOL) 500 MG tablet Take 1,000 mg by mouth every 6 (six) hours as needed. For pain       . diltiazem (DILACOR XR) 120 MG 24 hr capsule Take 120 mg by mouth daily.        . furosemide (LASIX) 80 MG tablet Take 80 mg by mouth 2 (two) times daily.        Marland Kitchen gabapentin (NEURONTIN) 600 MG tablet Take 600 mg by mouth 3 (three) times daily.        Marland Kitchen HYDROcodone-acetaminophen (VICODIN) 5-500 MG per tablet Take 1 tablet by mouth 2 (two) times daily as needed. For pain       . insulin aspart (NOVOLOG) 100 UNIT/ML injection Inject 20 Units into the skin 2 (two) times daily. Per sliding scale       . insulin glargine (LANTUS) 100 UNIT/ML injection Inject 60 Units into the skin 2 (two) times daily.       . meclizine (ANTIVERT) 25 MG tablet Take 25 mg by mouth 3 (three) times daily as needed. For dizziness      . metoprolol (TOPROL-XL) 100 MG 24 hr tablet  Take 50 mg by mouth daily.        . pantoprazole (PROTONIX) 40 MG tablet Take 40 mg by mouth daily.        . potassium chloride (KLOR-CON) 10 MEQ CR tablet Take 20 mEq by mouth daily.        . promethazine (PHENERGAN) 25 MG tablet Take 25 mg by mouth every 4 (four) hours as needed. For nausea and vomiting      . sertraline (ZOLOFT) 50 MG tablet Take 50 mg by mouth daily.        . simvastatin (ZOCOR) 40 MG tablet Take 40 mg by mouth at bedtime.        Marland Kitchen warfarin (COUMADIN) 5 MG tablet Take 7.5 mg by mouth daily.        Marland Kitchen zolpidem (AMBIEN) 10 MG tablet Take 10 mg by mouth at bedtime as needed. For sleep       . DISCONTD: diltiazem (TIAZAC) 120 MG 24 hr capsule Take 120 mg by mouth daily.        Marland Kitchen DISCONTD: mupirocin (BACTROBAN) 2 % cream Apply to inside of nostrils twice daily  for 7 days prior to surgery  30 g  0  . DISCONTD: oxycodone (OXY-IR) 5 MG capsule Take 5-10 mg by mouth every 4 (four) hours as needed. For pain      . DISCONTD: warfarin (COUMADIN) 5 MG tablet Take 5 mg by mouth as directed.          Assessment: 65 year old female on Coumadin PTA for Afib and Hx PE. INR therapeutic this AM at 2.43.  Goal of Therapy:  INR 2-3   Plan:  Coumadin 7.5 mg po x 1 dose today  Daily PT/INR    Wyline Copas 02/04/2011,1:32 PM

## 2011-02-04 NOTE — Progress Notes (Signed)
Subjective:  The patient is lying flat and without dyspnea. She denies chest pain. There is been no significant diuresis. She is known fluid restriction and has several containers of fluid on her rest.  Objective:  Vital Signs in the last 24 hours: Temp:  [97.9 F (36.6 C)-98 F (36.7 C)] 97.9 F (36.6 C) (11/10 0500) Pulse Rate:  [63-85] 85  (11/10 0500) Resp:  [18] 18  (11/10 0500) BP: (98-119)/(63-82) 98/63 mmHg (11/10 0500) SpO2:  [96 %-98 %] 98 % (11/10 0500) Weight:  [130.2 kg (287 lb 0.6 oz)] 287 lb 0.6 oz (130.2 kg) (11/10 0500)  Intake/Output from previous day: 11/09 0701 - 11/10 0700 In: 1080 [P.O.:1080] Out: 450 [Urine:450] Intake/Output from this shift: Total I/O In: 240 [P.O.:240] Out: -   Physical Exam: General appearance: alert, cooperative and no distress Lungs: diminished breath sounds bilaterally Heart: regular rate and rhythm, S1, S2 normal, no murmur, click, rub or gallop Extremities: no edema, redness or tenderness in the calves or thighs   Lab Results:  [facility] 02/02/11 1601  WBC 5.7  HGB 11.4*  PLT 275    Basename 02/04/11 0600 02/03/11 0515  NA 137 143  K 3.2* 3.4*  CL 96 102  CO2 27 27  GLUCOSE 313* 162*  BUN 42* 36*  CREATININE 1.89* 1.80*    Basename 02/03/11 0800 02/02/11 2323  TROPONINI <0.30 <0.30   Hepatic Function Panel  Basename 02/02/11 1601  PROT 7.3  ALBUMIN 3.4*  AST 20  ALT 21  ALKPHOS 110  BILITOT 0.4  BILIDIR --  IBILI --   No results found for this basename: CHOL in the last 72 hours No results found for this basename: PROTIME in the last 72 hours  Imaging:   Cardiac Studies:  Assessment/Plan:  Atrial Fibrillation, this is a chronic problem and there is good rate control  CHF, acute and chronic diastolic heart failure continues to be more aggressive with diuresis today .  LOS: 2 days    Lesleigh Noe 02/04/2011, 10:53 AM

## 2011-02-05 LAB — BASIC METABOLIC PANEL
Calcium: 8.9 mg/dL (ref 8.4–10.5)
Creatinine, Ser: 2.3 mg/dL — ABNORMAL HIGH (ref 0.50–1.10)
GFR calc non Af Amer: 22 mL/min — ABNORMAL LOW (ref >90)
Sodium: 137 mEq/L (ref 135–145)

## 2011-02-05 LAB — GLUCOSE, CAPILLARY
Glucose-Capillary: 403 mg/dL — ABNORMAL HIGH (ref 70–99)
Glucose-Capillary: 388 mg/dL — ABNORMAL HIGH (ref 70–99)

## 2011-02-05 LAB — PROTIME-INR: INR: 2.77 — ABNORMAL HIGH (ref 0.00–1.49)

## 2011-02-05 MED ORDER — INSULIN GLARGINE 100 UNIT/ML ~~LOC~~ SOLN
70.0000 [IU] | Freq: Two times a day (BID) | SUBCUTANEOUS | Status: DC
  Administered 2011-02-05 (×2): 70 [IU] via SUBCUTANEOUS
  Filled 2011-02-05: qty 3

## 2011-02-05 MED ORDER — POTASSIUM CHLORIDE CRYS ER 20 MEQ PO TBCR: 40.0000 meq | EXTENDED_RELEASE_TABLET | Freq: Once | ORAL | Status: DC

## 2011-02-05 MED ORDER — METOLAZONE 2.5 MG PO TABS
2.5000 mg | ORAL_TABLET | Freq: Once | ORAL | Status: AC
  Administered 2011-02-05: 2.5 mg via ORAL
  Filled 2011-02-05: qty 1

## 2011-02-05 MED ORDER — INSULIN ASPART 100 UNIT/ML ~~LOC~~ SOLN
10.0000 [IU] | Freq: Once | SUBCUTANEOUS | Status: AC
  Administered 2011-02-05: 10 [IU] via SUBCUTANEOUS
  Filled 2011-02-05: qty 3

## 2011-02-05 MED ORDER — WARFARIN SODIUM 7.5 MG PO TABS
7.5000 mg | ORAL_TABLET | Freq: Once | ORAL | Status: AC
  Administered 2011-02-05: 7.5 mg via ORAL
  Filled 2011-02-05: qty 1

## 2011-02-05 MED ORDER — INSULIN ASPART 100 UNIT/ML ~~LOC~~ SOLN
10.0000 [IU] | Freq: Once | SUBCUTANEOUS | Status: AC
  Administered 2011-02-05: 10 [IU] via SUBCUTANEOUS

## 2011-02-05 MED ORDER — POTASSIUM CHLORIDE CRYS ER 20 MEQ PO TBCR
40.0000 meq | EXTENDED_RELEASE_TABLET | Freq: Three times a day (TID) | ORAL | Status: DC
  Administered 2011-02-05 – 2011-02-07 (×7): 40 meq via ORAL
  Filled 2011-02-05 (×6): qty 2
  Filled 2011-02-05: qty 1

## 2011-02-05 NOTE — Progress Notes (Signed)
Subjective:  65 year old with morbid obesity, uncontrolled DM, rate controlled atrial flutter, prior PE on chronic anticoagulation here with acute on chronic diastolic HF.   Still with DOE she states. Creat is slightly increased. I agree with Dr. Nehemiah [name] that some of the weight gain (30 lbs) may not all be fluid. Nevertheless will try one more day of aggressive diuresis.   No chest pain. +back pain.   Objective:  Vital Signs in the last 24 hours: Temp:  [98.1 F (36.7 C)-98.3 F (36.8 C)] 98.2 F (36.8 C) (11/11 0500) Pulse Rate:  [80-94] 80  (11/11 0500) Resp:  [18-20] 18  (11/11 0500) BP: (123-135)/(75-82) 123/81 mmHg (11/11 0500) SpO2:  [98 %-99 %] 98 % (11/11 0500) Weight:  [132 kg (291 lb 0.1 oz)] 291 lb 0.1 oz (132 kg) (11/11 0500)  Intake/Output from previous day: 11/10 0701 - 11/11 0700 In: 480 [P.O.:480] Out: 1900 [Urine:1900]   Physical Exam: General: Well developed, well nourished, in no acute distress. Obese Head:  Normocephalic and atraumatic. Lungs: Clear to auscultation and percussion. Heart: Normal S1 and S2.  No murmur, rubs or gallops.  Pulses: Pulses normal in all 4 extremities. Extremities: No clubbing or cyanosis. No edema. Neurologic: Alert and oriented x 3.    Lab Results:  [facility] 02/02/11 1601  WBC 5.7  HGB 11.4*  PLT 275    Basename 02/05/11 0654 02/04/11 0600  NA 137 137  K 3.1* 3.2*  CL 94* 96  CO2 29 27  GLUCOSE 294* 313*  BUN 50* 42*  CREATININE 2.30* 1.89*    Basename 02/03/11 0800 02/02/11 2323  TROPONINI <0.30 <0.30   Hepatic Function Panel  Basename 02/02/11 1601  PROT 7.3  ALBUMIN 3.4*  AST 20  ALT 21  ALKPHOS 110  BILITOT 0.4  BILIDIR --  IBILI --    Assessment/Plan:  Principal Problem:  *Acute on chronic diastolic heart failure - will try one more day of aggressive diuresis. I will continue Lasix (was 80 IV, increased to 120 IV yesterday) and give her one more dose of metolazone 2.5 as I did on Friday.   I  will monitor renal function. Could be azotemia. I agree with Dr. Nehemiah [name] that her weight gain is perhaps more adipose and less fluid. Will try one more day of diuresis. If renal function worsens tomorrow, will stop.   Needs activity. Home PT. Weight loss is a must. At increased mortality risk.  Will replete K.   BP under control.   Coumadin therapeutic.   Appreciate Dr. Idelle [name] assistance. DM mgt. Uncontrolled.   Active Problems:  HYPERLIPIDEMIA  Morbid obesity  HYPERTENSION  ATRIAL FLUTTER, CHRONIC  RENAL INSUFFICIENCY, CHRONIC  Encounter for [place]-term (current) use of anticoagulants     [name] [name] 02/05/2011, 9:37 AM

## 2011-02-05 NOTE — Progress Notes (Signed)
Subjective: Patient is pleasant, no apparent distress, per nurse no problems overnight no arrhythmias. Weight appears to be, patient is supposed to be on fluid restriction. Blood sugars continue to remain elevated. Followup A1c approximately 11. Patient has been encouraged to limit fluids and to limit carbohydrates. He  Objective: Vital signs in last 24 hours: Temp:  [98.1 F (36.7 C)-98.3 F (36.8 C)] 98.2 F (36.8 C) (11/11 0500) Pulse Rate:  [80-94] 80  (11/11 0500) Resp:  [18-20] 18  (11/11 0500) BP: (123-135)/(75-82) 123/81 mmHg (11/11 0500) SpO2:  [98 %-99 %] 98 % (11/11 0500) Weight:  [132 kg (291 lb 0.1 oz)] 291 lb 0.1 oz (132 kg) (11/11 0500) Weight change: 1.8 kg (3 lb 15.5 oz) Last BM Date: 02/04/11  Intake/Output from previous day: 11/10 0701 - 11/11 0700 In: 480 [P.O.:480] Out: 1900 [Urine:1900] Intake/Output this shift:    Head: Normocephalic, without obvious abnormality, atraumatic Resp: clear to auscultation bilaterally Cardio: regular rate and rhythm, S1, S2 normal, no murmur, click, rub or gallop Extremities: edema 1 + edema  Lab Results:  [facility] 02/02/11 1601  WBC 5.7  HGB 11.4*  HCT 34.2*  PLT 275   BMET  Basename 02/05/11 0654 02/04/11 0600  NA 137 137  K 3.1* 3.2*  CL 94* 96  CO2 29 27  GLUCOSE 294* 313*  BUN 50* 42*  CREATININE 2.30* 1.89*  CALCIUM 8.9 8.7    Studies/Results: No results found.  Medications: I have reviewed the patient's current medications.  Assessment/Plan: Diastolic dysfunction Chronic kidney disease stage III Diabetes poor control Morbid obesity Depression Hypokalemia Chronic pain  At this time we will replace potassium as her diuretics have been increased, increase Lantus as well. Patient's creatinine has bumped to 2.3, a great deal of her remaining weight may be from her obesity recommend just diuretics per cardiology.  LOS: 3 days   [name] [name] D 02/05/2011, 8:33 AM

## 2011-02-05 NOTE — Progress Notes (Signed)
ANTICOAGULATION CONSULT NOTE - Follow Up Consult  Pharmacy Consult for Coumadin Indication: afib and Hx PE  Allergies  Allergen Reactions  . Shellfish Allergy     REACTION: nausea, vomiting, abdominal pain    Patient Measurements: Height: 5\' 3"  (160 cm) Weight: 291 lb 0.1 oz (132 kg) IBW/kg (Calculated) : 52.4   Vital Signs: Temp: 98.2 F (36.8 C) (11/11 1300) Temp src: Oral (11/11 1300) BP: 126/83 mmHg (11/11 1300) Pulse Rate: 84  (11/11 1300)  Labs:  Basename 02/05/11 0654 02/04/11 0600 02/03/11 0800 02/03/11 0515 02/02/11 2323 02/02/11 1601  HGB -- -- -- -- -- 11.4*  HCT -- -- -- -- -- 34.2*  PLT -- -- -- -- -- 275  APTT -- -- -- -- -- --  LABPROT 29.7* 26.8* -- 29.6* -- --  INR 2.77* 2.43* -- 2.76* -- --  HEPARINUNFRC -- -- -- -- -- --  CREATININE 2.30* 1.89* -- 1.80* -- --  CKTOTAL -- -- 184* -- 213* 241*  CKMB -- -- 3.3 -- 3.7 4.2*  TROPONINI -- -- <0.30 -- <0.30 <0.30   Estimated Creatinine Clearance: 33.7 ml/min (by C-G formula based on Cr of 2.3).   Medications:  Scheduled:    . diltiazem  120 mg Oral Daily  . furosemide  120 mg Intravenous Q8H  . gabapentin  600 mg Oral TID  . insulin aspart  0-20 Units Subcutaneous TID WC  . insulin aspart  10 Units Subcutaneous Once  . insulin glargine  70 Units Subcutaneous BID  . metolazone  2.5 mg Oral Once  . metoprolol  50 mg Oral Daily  . mupirocin   Topical Daily  . pantoprazole  40 mg Oral Daily  . potassium chloride  40 mEq Oral TID  . rosuvastatin  10 mg Oral Daily  . sertraline  50 mg Oral Daily  . sodium chloride  3 mL Intravenous Q12H  . warfarin  7.5 mg Oral ONCE-1800  . DISCONTD: insulin glargine  60 Units Subcutaneous BID  . DISCONTD: potassium chloride  40 mEq Oral BID  . DISCONTD: potassium chloride  40 mEq Oral Once    Assessment: 65 year old female on Coumadin PTA for Afib and Hx PE. INR therapeutic this AM at 2.77. No bleeding noted.  Goal of Therapy:  INR 2-3    Plan: 1. Coumadin  7.5mg  PO x1 @1800  2. F/u INR in the AM 3. Monitor for bleeding and CBC if available.   Wyline Copas 02/05/2011,2:28 PM

## 2011-02-06 LAB — GLUCOSE, CAPILLARY
Glucose-Capillary: 466 mg/dL — ABNORMAL HIGH (ref 70–99)
Glucose-Capillary: 446 mg/dL — ABNORMAL HIGH (ref 70–99)
Glucose-Capillary: 383 mg/dL — ABNORMAL HIGH (ref 70–99)
Glucose-Capillary: 353 mg/dL — ABNORMAL HIGH (ref 70–99)

## 2011-02-06 LAB — BASIC METABOLIC PANEL
Calcium: 8.6 mg/dL (ref 8.4–10.5)
GFR calc non Af Amer: 21 mL/min — ABNORMAL LOW (ref >90)
Glucose, Bld: 445 mg/dL — ABNORMAL HIGH (ref 70–99)
Potassium: 3.4 mEq/L — ABNORMAL LOW (ref 3.5–5.1)
Sodium: 134 mEq/L — ABNORMAL LOW (ref 135–145)

## 2011-02-06 LAB — GLUCOSE, RANDOM: Glucose, Bld: 523 mg/dL — ABNORMAL HIGH (ref 70–99)

## 2011-02-06 LAB — PROTIME-INR: INR: 3.11 — ABNORMAL HIGH (ref 0.00–1.49)

## 2011-02-06 MED ORDER — WARFARIN SODIUM 3 MG PO TABS
3.0000 mg | ORAL_TABLET | Freq: Once | ORAL | Status: AC
  Administered 2011-02-06: 3 mg via ORAL
  Filled 2011-02-06: qty 1

## 2011-02-06 MED ORDER — INSULIN GLARGINE 100 UNIT/ML ~~LOC~~ SOLN
80.0000 [IU] | Freq: Two times a day (BID) | SUBCUTANEOUS | Status: DC
  Administered 2011-02-06 – 2011-02-07 (×3): 80 [IU] via SUBCUTANEOUS
  Filled 2011-02-06: qty 3

## 2011-02-06 MED ORDER — INSULIN ASPART 100 UNIT/ML ~~LOC~~ SOLN
14.0000 [IU] | Freq: Once | SUBCUTANEOUS | Status: AC
  Administered 2011-02-06: 14 [IU] via SUBCUTANEOUS
  Filled 2011-02-06: qty 3

## 2011-02-06 NOTE — Progress Notes (Signed)
ANTICOAGULATION CONSULT NOTE - Follow Up Consult  Pharmacy Consult for Coumadin Indication: pulmonary embolus  Allergies  Allergen Reactions  . Shellfish Allergy     REACTION: nausea, vomiting, abdominal pain    Patient Measurements: Height: 5\' 3"  (160 cm) Weight: 296 lb 4.8 oz (134.401 kg) IBW/kg (Calculated) : 52.4   Vital Signs: Temp: 97.6 F (36.4 C) (11/12 0500) Temp src: Oral (11/12 0500) BP: 138/89 mmHg (11/12 0500) Pulse Rate: 95  (11/12 0500)  Labs:  Basename 02/06/11 0650 02/05/11 0654 02/04/11 0600  HGB -- -- --  HCT -- -- --  PLT -- -- --  APTT -- -- --  LABPROT 32.5* 29.7* 26.8*  INR 3.11* 2.77* 2.43*  HEPARINUNFRC -- -- --  CREATININE 2.31* 2.30* 1.89*  CKTOTAL -- -- --  CKMB -- -- --  TROPONINI -- -- --   Estimated Creatinine Clearance: 34 ml/min (by C-G formula based on Cr of 2.31).   Medications:  Scheduled:    . diltiazem  120 mg Oral Daily  . furosemide  120 mg Intravenous Q8H  . gabapentin  600 mg Oral TID  . insulin aspart  0-20 Units Subcutaneous TID WC  . insulin aspart  10 Units Subcutaneous Once  . insulin aspart  10 Units Subcutaneous Once  . insulin glargine  80 Units Subcutaneous BID  . metolazone  2.5 mg Oral Once  . metoprolol  50 mg Oral Daily  . mupirocin   Topical Daily  . pantoprazole  40 mg Oral Daily  . potassium chloride  40 mEq Oral TID  . rosuvastatin  10 mg Oral Daily  . sertraline  50 mg Oral Daily  . sodium chloride  3 mL Intravenous Q12H  . warfarin  7.5 mg Oral ONCE-1800  . DISCONTD: insulin glargine  70 Units Subcutaneous BID    Assessment: 65yo female with h/o PE, has been receiving Coumadin 7.5mg /day as at home with increasing INR.  No problems noted.  Possible d/c home today per CM documentation.  Goal of Therapy:  INR 2-3   Plan:  1.  Coumadin 3mg  today 2.  Recommend discharge on home dose of 7.5mg  daily, with repeat INR on Thursday. 3.  Coumadin teaching completed.  Annahi Short  P 02/06/2011,10:25 AM

## 2011-02-06 NOTE — Progress Notes (Signed)
Patient's capillary glucose was not taken before she ate dinner.  The value at 1539 was 348. Meal coverage in the form of Novolog was not given.  Dr. Eula Listen was notified and explained that her meal coverage could be given at a later time. The nurse tech rechecked her blood sugar and a value of 353 resulted at 1755.  The patient was administered 20 units of Novolog at 1827,  which Dr. Eula Listen was aware of, and approved as the appropriate plan of action for [name] [name].

## 2011-02-06 NOTE — Progress Notes (Signed)
Pt CBG 466 this am, lab ordered to confirm; Dr. Anne Fu made aware, told to give 20units of insulin; will continue to monitor

## 2011-02-06 NOTE — Progress Notes (Signed)
Patient's bloods sugars have been running above 400. Dr. Nehemiah Settle was made aware of the increased capillary blood glucose readings. Dr. Nehemiah Settle advised an administration of 20 units of Novolog at this time, and a recheck of her capillary blood glucose in 1 hour. If capillary blood glucose reading has not decreased or continues to increase, will contact Dr. Nehemiah Settle for further instructions on lowering her blood sugar.

## 2011-02-06 NOTE — Progress Notes (Signed)
Contacted [facility] for [facility] PT for possible D/C today.

## 2011-02-06 NOTE — Progress Notes (Signed)
Spoke with RN. CBG has been high all morning and still 416. Will hold PT at this time. Ivonne Andrew, PT, DPT [phone]

## 2011-02-06 NOTE — Progress Notes (Addendum)
  Subjective:  65 year old with morbid obesity, uncontrolled DM, rate controlled atrial flutter, prior PE on chronic anticoagulation here with acute on chronic diastolic HF.   Still with DOE she states. Feels a little dizzy. Creat is slightly increased yesterday, await today labs.  I agree with Dr. Nehemiah Settle that some of the weight gain (30 lbs) may not all be fluid. No chest pain. +back pain.   Objective:  Vital Signs in the last 24 hours: Temp:  [97.6 F (36.4 C)-98.2 F (36.8 C)] 97.6 F (36.4 C) (11/12 0500) Pulse Rate:  [84-99] 95  (11/12 0500) Resp:  [18-19] 18  (11/12 0500) BP: (102-138)/(60-89) 138/89 mmHg (11/12 0500) SpO2:  [98 %-99 %] 98 % (11/12 0500) Weight:  [134.401 kg (296 lb 4.8 oz)] 296 lb 4.8 oz (134.401 kg) (11/12 0500)  Intake/Output from previous day: 11/11 0701 - 11/12 0700 In: 910 [P.O.:660; IV Piggyback:250] Out: -    Physical Exam: General: Well developed, well nourished, in no acute distress. Obese Head:  Normocephalic and atraumatic. Lungs: Clear to auscultation and percussion. Heart: Normal S1 and S2.  No murmur, rubs or gallops.  Pulses: Pulses normal in all 4 extremities. Extremities: No clubbing or cyanosis. No edema. Neurologic: Alert and oriented x 3.    Lab Results: No results found for this basename: WBC:2,HGB:2,PLT:2 in the last 72 hours  Basename 02/05/11 0654 02/04/11 0600  NA 137 137  K 3.1* 3.2*  CL 94* 96  CO2 29 27  GLUCOSE 294* 313*  BUN 50* 42*  CREATININE 2.30* 1.89*    Basename 02/03/11 0800  TROPONINI <0.30   Hepatic Function Panel No results found for this basename: PROT,ALBUMIN,AST,ALT,ALKPHOS,BILITOT,BILIDIR,IBILI in the last 72 hours  Assessment/Plan:  Principal Problem:  *Acute on chronic diastolic heart failure -diuresis with increased BUN/Creat. Not much fluid loss. It is doubtful the weight gain was mostly fluid.   Stop lasix today and recheck BMET in am.   I will monitor renal function.   Needs  activity. Home PT. Weight loss is a must. At increased mortality risk.  Will replete K, 3.4 this am. Give PM dose as well.   BP under control.   Coumadin therapeutic.   Appreciate Dr. Idelle Crouch assistance. DM mgt. Uncontrolled.  I increased Lantus to 80 bid Active Problems:  HYPERLIPIDEMIA  Morbid obesity  HYPERTENSION  ATRIAL FLUTTER, CHRONIC - stable rate control  RENAL INSUFFICIENCY, CHRONIC  Encounter for [facility]-term (current) use of anticoagulants     SKAINS, MARK 02/06/2011, 7:59 AM    Note: Her blood glucose has remained significantly elevated in 400 range (even after Lantus increased to 80 BID and 20 units of Novolog given this am. I have asked nursing to discuss this with Dr. Nehemiah Settle, her PCP who will be following [facility] term DM.

## 2011-02-06 NOTE — Progress Notes (Signed)
Subjective: Patient pleasant, no apparent distress, she is up walking in her room without any problem. Unfortunately her blood sugars have been very high today, according to the nurses she was drinking a lot of juice, patient states she has not been consuming any sweets other than nose. At the every hour checks her sugars are still 348. Patient is alert oriented, no acidosis on labs, historically her blood sugars have been well-controlled mainly from dietary reasons.  Objective: Vital signs in last 24 hours: Temp:  [97.6 F (36.4 C)-98 F (36.7 C)] 98 F (36.7 C) (11/12 1400) Pulse Rate:  [82-99] 82  (11/12 1400) Resp:  [18] 18  (11/12 1400) BP: (102-138)/(60-89) 129/81 mmHg (11/12 1400) SpO2:  [98 %-99 %] 99 % (11/12 1400) Weight:  [134.401 kg (296 lb 4.8 oz)] 296 lb 4.8 oz (134.401 kg) (11/12 0500) Weight change: 2.401 kg (5 lb 4.7 oz) Last BM Date: 02/05/11  Intake/Output from previous day: 11/11 0701 - 11/12 0700 In: 1150 [P.O.:900; IV Piggyback:250] Out: -  Intake/Output this shift: Total I/O In: 240 [P.O.:240] Out: -   Head: Normocephalic, without obvious abnormality, atraumatic Resp: clear to auscultation bilaterally Cardio: regular rate and rhythm, S1, S2 normal, no murmur, click, rub or gallop Extremities: extremities normal, atraumatic, no cyanosis or edema  Lab Results: No results found for this basename: WBC:2,HGB:2,HCT:2,PLT:2 in the last 72 hours BMET  Basename 02/06/11 0840 02/06/11 0650 02/05/11 0654  NA -- 134* 137  K -- 3.4* 3.1*  CL -- 91* 94*  CO2 -- 28 29  GLUCOSE 523* 445* --  BUN -- 57* 50*  CREATININE -- 2.31* 2.30*  CALCIUM -- 8.6 8.9    Studies/Results: No results found.  Medications:  Scheduled:   . diltiazem  120 mg Oral Daily  . gabapentin  600 mg Oral TID  . insulin aspart  0-20 Units Subcutaneous TID WC  . insulin aspart  10 Units Subcutaneous Once  . insulin glargine  80 Units Subcutaneous BID  . metoprolol  50 mg Oral Daily  .  mupirocin   Topical Daily  . pantoprazole  40 mg Oral Daily  . potassium chloride  40 mEq Oral TID  . rosuvastatin  10 mg Oral Daily  . sertraline  50 mg Oral Daily  . sodium chloride  3 mL Intravenous Q12H  . warfarin  3 mg Oral ONCE-1800  . warfarin  7.5 mg Oral ONCE-1800  . DISCONTD: furosemide  120 mg Intravenous Q8H  . DISCONTD: insulin glargine  70 Units Subcutaneous BID    Assessment/Plan: Diabetes uncontrolled, at this time we will give an additional dose of NovoLog, continue sliding scale insulins and sling resistance. Her Lantus has already been increased to 80 units twice a day. We'll continue, hydrate modified diet. If needed we may need to change to every 4 hours blood sugar checks Hypertension controlled Volume overload, IV Lasix DC'd because of bump in creatinine Atrial fibrillation/flutter Depression Morbid obesity  LOS: 4 days   Lacey Wallman D 02/06/2011, 3:42 PM

## 2011-02-07 ENCOUNTER — Other Ambulatory Visit: Payer: Self-pay | Admitting: Cardiology

## 2011-02-07 LAB — BASIC METABOLIC PANEL
Chloride: 96 mEq/L (ref 96–112)
Creatinine, Ser: 2.01 mg/dL — ABNORMAL HIGH (ref 0.50–1.10)
GFR calc Af Amer: 29 mL/min — ABNORMAL LOW (ref >90)
Sodium: 139 mEq/L (ref 135–145)

## 2011-02-07 LAB — PROTIME-INR
INR: 3.22 — ABNORMAL HIGH (ref 0.00–1.49)
Prothrombin Time: 33.4 seconds — ABNORMAL HIGH (ref 11.6–15.2)

## 2011-02-07 LAB — GLUCOSE, CAPILLARY

## 2011-02-07 MED ORDER — GABAPENTIN 600 MG PO TABS
600.0000 mg | ORAL_TABLET | Freq: Two times a day (BID) | ORAL | Status: DC
  Administered 2011-02-07: 600 mg via ORAL
  Filled 2011-02-07 (×2): qty 1

## 2011-02-07 MED ORDER — POTASSIUM CHLORIDE CRYS ER 20 MEQ PO TBCR: 40.0000 meq | EXTENDED_RELEASE_TABLET | Freq: Two times a day (BID) | ORAL | Status: DC

## 2011-02-07 MED ORDER — INSULIN GLARGINE 100 UNIT/ML ~~LOC~~ SOLN: 80.0000 [IU] | Freq: Two times a day (BID) | SUBCUTANEOUS | Status: DC

## 2011-02-07 MED ORDER — GABAPENTIN 600 MG PO TABS: 600.0000 mg | ORAL_TABLET | Freq: Two times a day (BID) | ORAL | Status: DC

## 2011-02-07 NOTE — Progress Notes (Signed)
Spoke with pt this morning. Pt not interested in practicing steps or ambulation with PT this am. Reports no concerns with safety on going home today. Reinforced pt's need for exercise. Will defer further therapy and education to HHPT.  Ivonne Andrew, PT, DPT [phone]

## 2011-02-07 NOTE — Progress Notes (Signed)
I discharged [name]. [name] was given information related to new medications to be administered, and continuing of her home medications. She received information on community resources which could be accessed, as well as information on her upcoming plan for follow-up with her home health care, and doctor's appointments. She was given information on her heart healthy low or no sodium diet.  She expressed no misunderstanding related to her discharge instructions. She signed her discharge documentation, and was very appreciative of the care she received.

## 2011-02-07 NOTE — Plan of Care (Signed)
Problem: Discharge Progression Outcomes Goal: If EF < 40% ACEI/ARB addressed at discharge Outcome: Not Applicable Date Met:  02/07/11 Ef 60-65%

## 2011-02-07 NOTE — Discharge Summary (Addendum)
Physician Discharge Summary  Patient ID: [name] [name],  MRN: [id number], DOB/AGE: [birth year] 65 y.o.  Admit date: 02/02/2011 Discharge date: 02/07/2011  Primary Discharge Diagnosis:  1: Dyspnea-multifactorial in part secondary to diastolic heart failure, morbid obesity. BNP was elevated however with aggressive diuresis, she increased her azotemia. Her weight remained relatively unchanged. It is likely that her weight is secondary to adipose tissue versus fluid. Continue with current medications, Lasix. Potassium was low, continuous supplementation.  2: Diabetes uncontrolled-her Lantus was increased to 80 units twice a day from 60 twice a day. Dr. Nehemiah Settle following. Hemoglobin A1c 11.  3: Chronic atrial flutter-on anticoagulation. Rate controlled. Stable. Both metoprolol, diltiazem.  4: Morbid obesity-she has gained over 30 pounds over the last 3 months. She needs to lose approximately 120 pounds. I expressed the importance of this to her. She is at high risk for cardiovascular mortality. Throughout her hospitalization we gave her teaching in this regard.  5: Hypertension-currently controlled.  6: Chronic kidney disease-creatinine ranged from 1.8-2.3. During aggressive diuresis which included Lasix 80 mg IV q. 8 hours with occasional metolazone, her creatinine increased as well as her BUN indicative of azotemia. This is why I believe that her weight is not all fluid.    Discharge Vitals: Blood pressure 125/66, pulse 75, temperature 97.7 F (36.5 C), temperature source Oral, resp. rate 21, height 5\' 3"  (1.6 m), weight 134.9 kg (297 lb 6.4 oz), SpO2 95.00%.  Labs: Lab Results  Component Value Date   WBC 5.7 02/02/2011   HGB 11.4* 02/02/2011   HCT 34.2* 02/02/2011   MCV 94.2 02/02/2011   PLT 275 02/02/2011    Lab 02/07/11 0545 02/02/11 1601  NA 139 --  K 3.2* --  CL 96 --  CO2 30 --  BUN 55* --  CREATININE 2.01* --  CALCIUM 9.2 --  PROT -- 7.3  BILITOT -- 0.4  ALKPHOS -- 110  ALT  -- 21  AST -- 20  GLUCOSE 116* --   No results found for this basename: CKTOTAL:4,CKMB:4,TROPONINI:4 in the last 72 hours No results found for this basename: CHOL, HDL, LDLCALC, TRIG   Lab Results  Component Value Date   DDIMER  Value: 1.17        AT THE INHOUSE ESTABLISHED CUTOFF VALUE OF 0.48 ug/mL FEU, THIS ASSAY HAS BEEN DOCUMENTED IN THE LITERATURE TO HAVE A SENSITIVITY AND NEGATIVE PREDICTIVE VALUE OF AT LEAST 98 TO 99%.  THE TEST RESULT SHOULD BE CORRELATED WITH AN ASSESSMENT OF THE CLINICAL PROBABILITY OF DVT / VTE.* 01/27/2010    Diagnostic Studies/Procedures:  Dg Chest 2 View  02/02/2011  *RADIOLOGY REPORT*  Clinical Data: Dyspnea, diabetes  CHEST - 2 VIEW  Comparison: 06/17/2010  Findings: Vascular clips in the left upper abdomen.  Stable mild cardiomegaly.  Lungs clear.  No effusion.  Minimal spurring in the thoracic spine. 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  IMPRESSION:  1.  Stable mild cardiomegaly.  No acute disease.  Original Report Authenticated By: Osa Craver, M.D.    Discharge Medications:  Current Discharge Medication List    START taking these medications   Details  potassium chloride SA (K-DUR,KLOR-CON) 20 MEQ tablet Take 2 tablets (40 mEq total) by mouth 2 (two) times daily. Qty: 60 tablet, Refills: 3      CONTINUE these medications which have CHANGED   Details  gabapentin (NEURONTIN) 600 MG tablet Take 1 tablet (600 mg total) by mouth 2 (two) times daily. Qty: 60 tablet,  Refills: 3    insulin glargine (LANTUS) 100 UNIT/ML injection Inject 80 Units into the skin 2 (two) times daily. Qty: 10 mL, Refills: 3      CONTINUE these medications which have NOT CHANGED   Details  acetaminophen (TYLENOL) 500 MG tablet Take 1,000 mg by mouth every 6 (six) hours as needed. For pain     diltiazem (DILACOR XR) 120 MG 24 hr capsule Take 120 mg by mouth daily.      furosemide (LASIX) 80 MG tablet Take 80 mg by mouth 2 (two) times daily.        HYDROcodone-acetaminophen (VICODIN) 5-500 MG per tablet Take 1 tablet by mouth 2 (two) times daily as needed. For pain     insulin aspart (NOVOLOG) 100 UNIT/ML injection Inject 20 Units into the skin 2 (two) times daily. Per sliding scale     meclizine (ANTIVERT) 25 MG tablet Take 25 mg by mouth 3 (three) times daily as needed. For dizziness    metoprolol (TOPROL-XL) 100 MG 24 hr tablet Take 50 mg by mouth daily.      pantoprazole (PROTONIX) 40 MG tablet Take 40 mg by mouth daily.      promethazine (PHENERGAN) 25 MG tablet Take 25 mg by mouth every 4 (four) hours as needed. For nausea and vomiting    sertraline (ZOLOFT) 50 MG tablet Take 50 mg by mouth daily.      simvastatin (ZOCOR) 40 MG tablet Take 40 mg by mouth at bedtime.      warfarin (COUMADIN) 5 MG tablet Take 7.5 mg by mouth daily.      zolpidem (AMBIEN) 10 MG tablet Take 10 mg by mouth at bedtime as needed. For sleep       STOP taking these medications     potassium chloride (KLOR-CON) 10 MEQ CR tablet            mupirocin (BACTROBAN) 2 % cream      oxycodone (OXY-IR) 5 MG capsule         Disposition:  The patient will be discharged in stable condition to home. Discharge Orders    Future Orders Please Complete By Expires   Diet - low sodium heart healthy      Increase activity slowly      Discharge instructions      Comments:   Decrease fluid intake. No salt.     Follow-up Information    Follow up with POLITE,RONALD D in 1 week.   Contact information:   [address], [address] [employer] [address] [place] [address] [phone]          Duration of Discharge Encounter: Greater than 30 minutes including physician and PA time.  Lonna Duval, MARK MD 02/07/2011, 8:20 AM   40 min. Spent on DC. Discussion with patient, review of records, labs, meds. Appt.

## 2011-02-07 NOTE — Progress Notes (Signed)
CSW provided pt. With  A scale and a weekly pill box.

## 2011-02-07 NOTE — Progress Notes (Signed)
Contacted [facility] for shower chair for home. Will provide scale for pt and educated pt on the importance of weighing and recording weight daily. Contacted [facility] for additional HH OT/RN/aide.

## 2011-02-08 IMAGING — CR DG CHEST 1V PORT
1 series · 1 of 1 positions shown · non-contrast
Comparison: CTA of the chest 01/28/2010 and chest radiograph
04/01/2009and 01/27/2010

CLINICAL DATA: Right shoulder pain.  Question pneumonia.
Productive cough.

PORTABLE CHEST - 1 VIEW

[view not recorded]
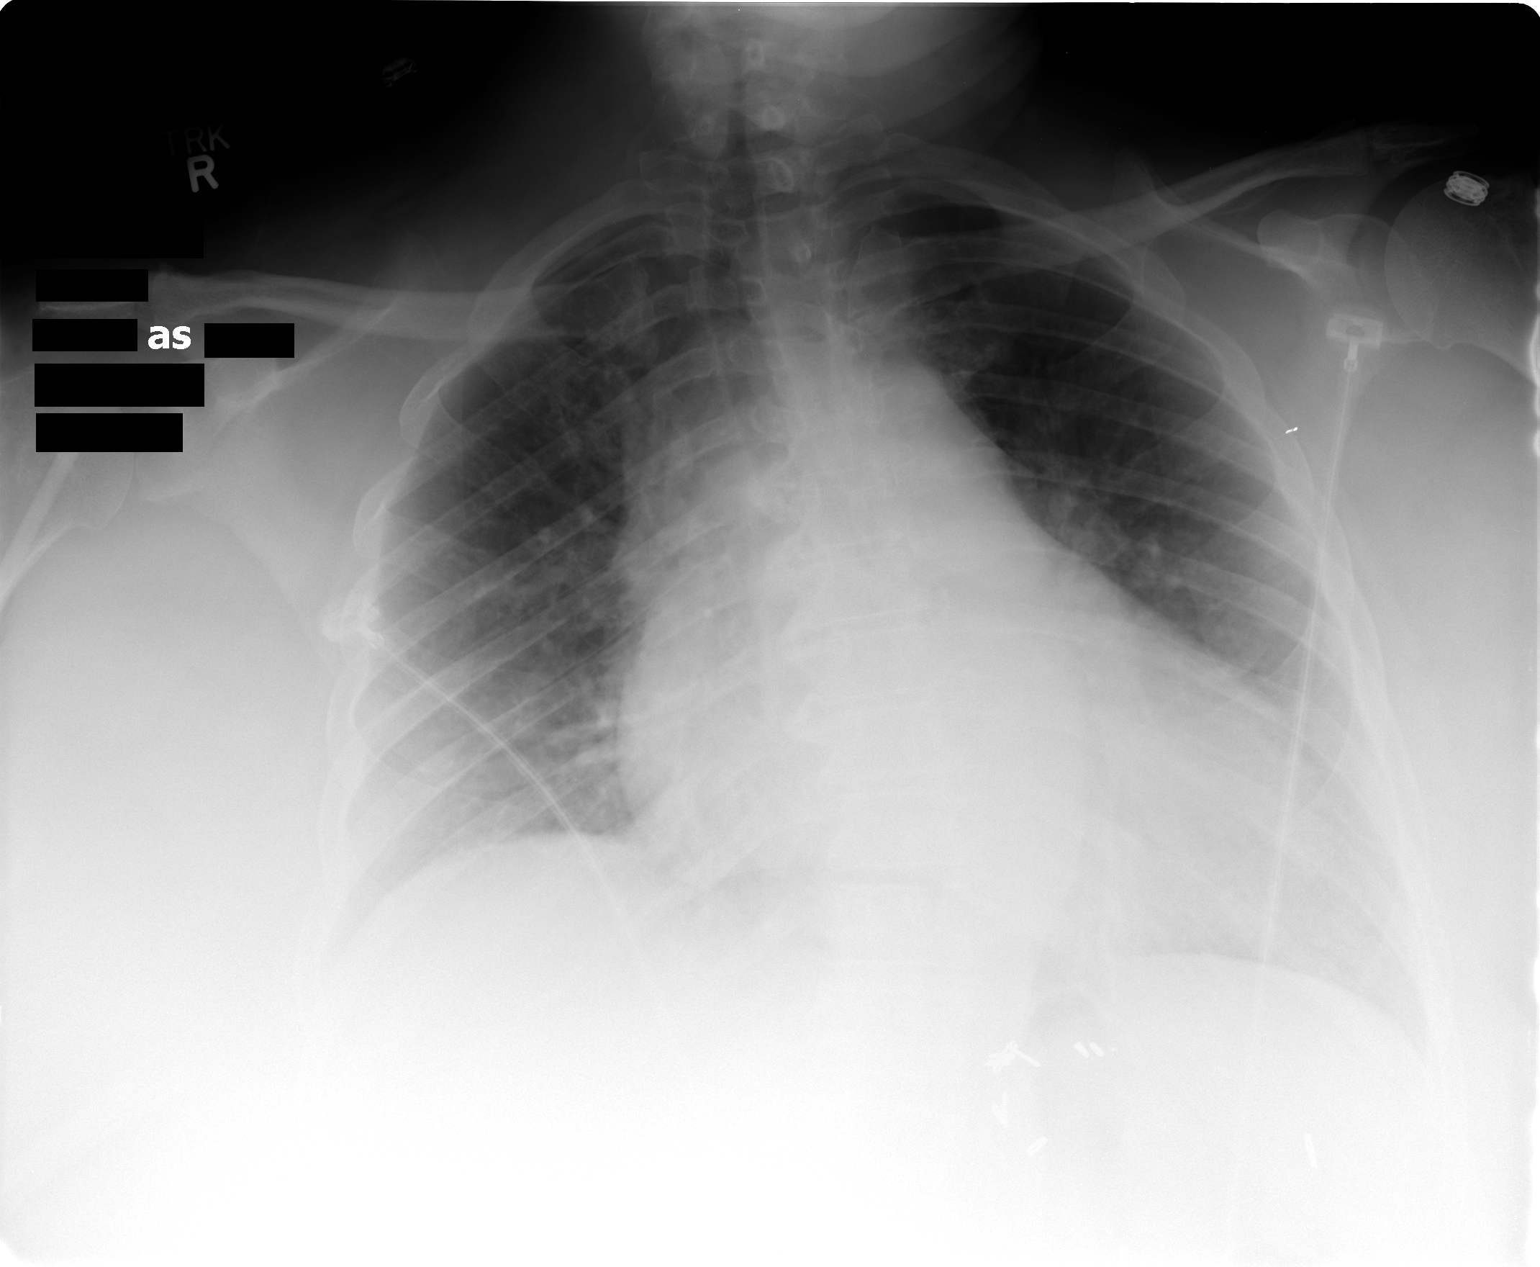

[1 of 1 positions shown; findings below may reference images not displayed]

FINDINGS: The patient is rotated to the right.  Cardiomegaly
appears stable.  Lung volumes are slightly low.  The pulmonary
vascularity appears mildly prominent, and may be accentuated by low
lung volumes.  There is crowding of the lung markings at the lung
bases.  No discrete airspace disease is identified.  No visible
pleural effusion.  There are postsurgical changes in the left upper
quadrant.  No acute bony abnormality.
IMPRESSION: 1.  Low lung volumes/hypoventilation.  No radiographic evidence of
pneumonia.  If there is persistent clinical concern for pneumonia,
PA and lateral chest radiograph could be considered.
2.  Cardiomegaly with mild pulmonary vascular congestion.

## 2011-05-02 ENCOUNTER — Observation Stay (HOSPITAL_COMMUNITY): Payer: Federal, State, Local not specified - PPO

## 2011-05-02 ENCOUNTER — Observation Stay (HOSPITAL_COMMUNITY)
Admission: EM | Admit: 2011-05-02 | Discharge: 2011-05-02 | Disposition: A | Discharge status: Home / Self Care | Payer: Federal, State, Local not specified - PPO | Attending: Emergency Medicine | Admitting: Emergency Medicine

## 2011-05-02 ENCOUNTER — Encounter (HOSPITAL_COMMUNITY): Payer: Self-pay

## 2011-05-02 ENCOUNTER — Other Ambulatory Visit: Payer: Self-pay

## 2011-05-02 DIAGNOSIS — E669 Obesity, unspecified: Secondary | ICD-10-CM | POA: Insufficient documentation

## 2011-05-02 DIAGNOSIS — N183 Chronic kidney disease, stage 3 unspecified: Secondary | ICD-10-CM | POA: Insufficient documentation

## 2011-05-02 DIAGNOSIS — M545 Low back pain, unspecified: Principal | ICD-10-CM | POA: Insufficient documentation

## 2011-05-02 DIAGNOSIS — E119 Type 2 diabetes mellitus without complications: Secondary | ICD-10-CM | POA: Insufficient documentation

## 2011-05-02 DIAGNOSIS — I509 Heart failure, unspecified: Secondary | ICD-10-CM | POA: Insufficient documentation

## 2011-05-02 DIAGNOSIS — M7918 Myalgia, other site: Secondary | ICD-10-CM

## 2011-05-02 DIAGNOSIS — M129 Arthropathy, unspecified: Secondary | ICD-10-CM | POA: Insufficient documentation

## 2011-05-02 DIAGNOSIS — I471 Supraventricular tachycardia, unspecified: Secondary | ICD-10-CM | POA: Insufficient documentation

## 2011-05-02 LAB — CBC
HCT: 37.7 % (ref 36.0–46.0)
MCH: 30.8 pg (ref 26.0–34.0)
MCHC: 32.9 g/dL (ref 30.0–36.0)
MCV: 93.5 fL (ref 78.0–100.0)
Platelets: 315 10*3/uL (ref 150–400)
RDW: 14.5 % (ref 11.5–15.5)
WBC: 6.7 10*3/uL (ref 4.0–10.5)

## 2011-05-02 LAB — POCT I-STAT TROPONIN I: Troponin i, poc: 0.01 ng/mL (ref 0.00–0.08)

## 2011-05-02 LAB — BASIC METABOLIC PANEL
BUN: 36 mg/dL — ABNORMAL HIGH (ref 6–23)
CO2: 26 mEq/L (ref 19–32)
Calcium: 8.5 mg/dL (ref 8.4–10.5)
Creatinine, Ser: 2.3 mg/dL — ABNORMAL HIGH (ref 0.50–1.10)
GFR calc non Af Amer: 21 mL/min — ABNORMAL LOW (ref >90)
Glucose, Bld: 306 mg/dL — ABNORMAL HIGH (ref 70–99)

## 2011-05-02 LAB — DIFFERENTIAL
Basophils Absolute: 0.1 10*3/uL (ref 0.0–0.1)
Eosinophils Absolute: 0.1 10*3/uL (ref 0.0–0.7)
Eosinophils Relative: 2 % (ref 0–5)
Lymphocytes Relative: 48 % — ABNORMAL HIGH (ref 12–46)
Monocytes Absolute: 0.6 10*3/uL (ref 0.1–1.0)

## 2011-05-02 LAB — PRO B NATRIURETIC PEPTIDE: Pro B Natriuretic peptide (BNP): 1086 pg/mL — ABNORMAL HIGH (ref 0–125)

## 2011-05-02 MED ORDER — OXYCODONE-ACETAMINOPHEN 5-325 MG PO TABS: 1.0000 | ORAL_TABLET | ORAL | Status: AC | PRN

## 2011-05-02 MED ORDER — ONDANSETRON HCL 4 MG/2ML IJ SOLN
INTRAMUSCULAR | Status: AC
  Filled 2011-05-02: qty 2

## 2011-05-02 MED ORDER — ONDANSETRON HCL 4 MG/2ML IJ SOLN: 4.0000 mg | Freq: Four times a day (QID) | INTRAMUSCULAR | Status: DC | PRN

## 2011-05-02 MED ORDER — DILTIAZEM HCL ER COATED BEADS 120 MG PO CP24: 240.0000 mg | ORAL_CAPSULE | Freq: Every day | ORAL | Status: DC

## 2011-05-02 MED ORDER — DILTIAZEM HCL 100 MG IV SOLR
5.0000 mg/h | INTRAVENOUS | Status: DC
  Administered 2011-05-02: 5 mg/h via INTRAVENOUS
  Filled 2011-05-02: qty 100

## 2011-05-02 MED ORDER — HYDROMORPHONE HCL PF 1 MG/ML IJ SOLN
1.0000 mg | Freq: Once | INTRAMUSCULAR | Status: AC
  Administered 2011-05-02: 1 mg via INTRAVENOUS
  Filled 2011-05-02: qty 1

## 2011-05-02 MED ORDER — SODIUM CHLORIDE 0.9 % IV BOLUS (SEPSIS)
500.0000 mL | Freq: Once | INTRAVENOUS | Status: AC
  Administered 2011-05-02: 500 mL via INTRAVENOUS

## 2011-05-02 MED ORDER — HYDROMORPHONE HCL PF 1 MG/ML IJ SOLN: 1.0000 mg | Freq: Four times a day (QID) | INTRAMUSCULAR | Status: DC | PRN

## 2011-05-02 MED ORDER — DILTIAZEM HCL 60 MG PO TABS
60.0000 mg | ORAL_TABLET | ORAL | Status: AC
  Administered 2011-05-02: 60 mg via ORAL
  Filled 2011-05-02: qty 1

## 2011-05-02 MED ORDER — ONDANSETRON HCL 4 MG/2ML IJ SOLN
4.0000 mg | Freq: Once | INTRAMUSCULAR | Status: AC
  Administered 2011-05-02: 4 mg via INTRAVENOUS

## 2011-05-02 MED ORDER — ACETAMINOPHEN 325 MG PO TABS: 650.0000 mg | ORAL_TABLET | ORAL | Status: DC | PRN

## 2011-05-02 MED ORDER — HYDROMORPHONE HCL PF 1 MG/ML IJ SOLN
1.0000 mg | Freq: Once | INTRAMUSCULAR | Status: AC
Start: 2011-05-02 — End: 2011-05-02
  Administered 2011-05-02: 1 mg via INTRAVENOUS
  Filled 2011-05-02: qty 1

## 2011-05-02 NOTE — ED Notes (Signed)
Xray staff came to get pt for xray, pt stated she would like to finish eating first. Pt has been sleeping on and off.

## 2011-05-02 NOTE — ED Notes (Signed)
Patient is resting comfortably. 

## 2011-05-02 NOTE — ED Notes (Signed)
[place] sandwich was given to the patient

## 2011-05-02 NOTE — ED Notes (Signed)
Cardizem increased to 10mg/hr.

## 2011-05-02 NOTE — ED Provider Notes (Signed)
History     CSN: [id number]  Arrival date & time 05/02/11  0809   First MD Initiated Contact with Patient 05/02/11 0813      Chief Complaint  Patient presents with  . Back Pain    (Consider location/radiation/quality/duration/timing/severity/associated sxs/prior treatment) Patient is a 66 y.o. female presenting with back pain. The history is provided by the patient.  Back Pain  This is a recurrent problem. Episode onset: abou t 5-7 days ago. The problem occurs constantly. The problem has been gradually worsening. The pain is associated with no known injury. The pain is present in the lumbar spine. The quality of the pain is described as stabbing. Radiates to: toward left hip. The pain is at a severity of 10/10. The symptoms are aggravated by bending, twisting and certain positions. Stiffness is present in the morning. Pertinent negatives include no chest pain, no fever, no numbness, no headaches, no abdominal pain, no bowel incontinence, no perianal numbness, no bladder incontinence, no dysuria, no paresthesias, no paresis, no tingling and no weakness. She has tried nothing for the symptoms. Risk factors include obesity (diabetes).    Past Medical History  Diagnosis Date  . Angina   . Heart murmur   . Shortness of breath   . Blood dyscrasia   . Mental disorder   . Hypertension   . CHF (congestive heart failure)   . 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 Blood transfusion   . GERD (gastroesophageal reflux disease)   . Arthritis   . Anxiety   . Dysrhythmia   . Pneumonia   . Diabetes mellitus   . Anemia   . Depression   . Chronic kidney disease, stage III (moderate)   . 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 Vascular surgery     vein removed in legs  . Dilation and curettage of uterus   . Joint replacement     both knees  . Tubal ligation   . Shoulder surgery     right shoulder I& D secondary to septic joint     No family history on file.  History  Substance Use Topics  . Smoking status: Never Smoker   . Smokeless tobacco: Never Used  . Alcohol Use: No    OB History    Grav Para Term Preterm Abortions TAB SAB Ect Mult Living                  Review of Systems  Constitutional: Negative for fever, chills, activity change, appetite change and fatigue.  HENT: Negative for congestion, sore throat, rhinorrhea, neck pain and neck stiffness.   Eyes: Negative for photophobia, redness and visual disturbance.  Respiratory: Negative for cough, shortness of breath and wheezing.   Cardiovascular: Negative for chest pain, palpitations and leg swelling.  Gastrointestinal: Negative for nausea, vomiting, abdominal pain, diarrhea, constipation, blood in stool and bowel incontinence.  Genitourinary: Negative for bladder incontinence, dysuria, urgency, frequency, hematuria, flank pain, vaginal bleeding, vaginal discharge and difficulty urinating.  Musculoskeletal: Positive for back pain.  Skin: Negative for rash and wound.  Neurological: Negative for dizziness, tingling, seizures, facial asymmetry, speech difficulty, weakness, light-headedness, numbness, headaches and paresthesias.  Psychiatric/Behavioral: Negative for confusion.  All other systems reviewed and are negative.    Allergies  Shellfish allergy  Home Medications   Current Outpatient Rx  Name Route Sig Dispense Refill  . DILTIAZEM HCL 120 MG  PO CP24 Oral Take 120 mg by mouth daily.      . FUROSEMIDE 80 MG PO TABS Oral Take 80 mg by mouth 2 (two) times daily.      Marland Kitchen GABAPENTIN 600 MG PO TABS Oral Take 600 mg by mouth 3 (three) times daily.    Marland Kitchen HYDROCODONE-ACETAMINOPHEN 5-500 MG PO TABS Oral Take 1 tablet by mouth 2 (two) times daily as needed. For pain     . INSULIN ASPART 100 UNIT/ML [place] SOLN Subcutaneous Inject 20 Units into the skin 2 (two) times daily. Per sliding scale     . INSULIN GLARGINE 100 UNIT/ML [place] SOLN Subcutaneous Inject  70 Units into the skin 2 (two) times daily.    Marland Kitchen MECLIZINE HCL 25 MG PO TABS Oral Take 25 mg by mouth 3 (three) times daily as needed. For dizziness    . METOPROLOL SUCCINATE ER 100 MG PO TB24 Oral Take 50 mg by mouth daily.      Marland Kitchen PANTOPRAZOLE SODIUM 40 MG PO TBEC Oral Take 40 mg by mouth daily.      Marland Kitchen POTASSIUM CHLORIDE ER 10 MEQ PO TBCR Oral Take 20 mEq by mouth daily.    . SERTRALINE HCL 50 MG PO TABS Oral Take 50 mg by mouth daily.      Marland Kitchen SIMVASTATIN 40 MG PO TABS Oral Take 40 mg by mouth at bedtime.      . WARFARIN SODIUM 5 MG PO TABS Oral Take 7.5 mg by mouth daily.      Marland Kitchen ZOLPIDEM TARTRATE 10 MG PO TABS Oral Take 10 mg by mouth at bedtime as needed. For sleep     . ACETAMINOPHEN 500 MG PO TABS Oral Take 1,000 mg by mouth every 6 (six) hours as needed. For pain     . PROMETHAZINE HCL 25 MG PO TABS Oral Take 25 mg by mouth every 4 (four) hours as needed. For nausea and vomiting      BP 146/85  Pulse 115  Temp(Src) 98.3 F (36.8 C) (Oral)  Resp 16  Ht 5\' 4"  (1.626 m)  SpO2 100%  Physical Exam  Nursing note and vitals reviewed. Constitutional: She is oriented to person, place, and time. She appears well-developed and well-nourished.  Non-toxic appearance.       Appears in pain.    HENT:  Head: Normocephalic and atraumatic.  Mouth/Throat: Oropharynx is clear and moist.  Eyes: Conjunctivae and EOM are normal. Pupils are equal, round, and reactive to light. No scleral icterus.  Neck: Normal range of motion. Neck supple. No JVD present.  Cardiovascular: Normal rate, regular rhythm, normal heart sounds and intact distal pulses.   No murmur heard. Pulmonary/Chest: Effort normal and breath sounds normal. No respiratory distress. She has no wheezes. She has no rales.  Abdominal: Soft. Bowel sounds are normal. She exhibits no distension. There is no tenderness. There is no rebound and no guarding.  Musculoskeletal: Normal range of motion.       Lumbar back: She exhibits tenderness  (pain with palpation of left lower paraspinal musculature that reproduces her pain) and spasm. She exhibits normal range of motion, no bony tenderness, no swelling, no edema and no deformity.       Back:  Neurological: She is alert and oriented to person, place, and time. She has normal strength. No cranial nerve deficit or sensory deficit. GCS eye subscore is 4. GCS verbal subscore is 5. GCS motor subscore is 6.  Reflex Scores:  Patellar reflexes are 1+ on the right side and 1+ on the left side.      Achilles reflexes are 1+ on the right side and 1+ on the left side.      No clonus. Negative straight leg raise.   Skin: Skin is warm and dry. No rash noted. She is not diaphoretic.  Psychiatric: She has a normal mood and affect.    ED Course  Procedures (including critical care time)   Date: 05/02/2011  Rate: 130  Rhythm: supraventricular tachycardia (SVT)  QRS Axis: normal  Intervals: normal  ST/T Wave abnormalities: normal  Conduction Disutrbances:none  Narrative Interpretation: supraventricular tachycardia  Old EKG Reviewed: SVT present today compared to 06/17/10    Labs Reviewed  BASIC METABOLIC PANEL - Abnormal; Notable for the following:    Glucose, Bld 306 (*)    BUN 36 (*)    Creatinine, Ser 2.30 (*)    GFR calc non Af Amer 21 (*)    GFR calc Af Amer 25 (*)    All other components within normal limits  GLUCOSE, CAPILLARY - Abnormal; Notable for the following:    Glucose-Capillary 302 (*)    All other components within normal limits  DIFFERENTIAL - Abnormal; Notable for the following:    Neutrophils Relative 41 (*)    Lymphocytes Relative 48 (*)    All other components within normal limits  PROTIME-INR - Abnormal; Notable for the following:    Prothrombin Time 26.2 (*)    INR 2.36 (*)    All other components within normal limits  APTT - Abnormal; Notable for the following:    aPTT 41 (*)    All other components within normal limits  CBC  POCT I-STAT TROPONIN  I  URINALYSIS, ROUTINE W REFLEX MICROSCOPIC  PRO B NATRIURETIC PEPTIDE   Dg Hip Complete Left  05/02/2011  *RADIOLOGY REPORT*  Clinical Data: Left hip and leg pain for 5 days  LEFT HIP - COMPLETE 2+ VIEW  Comparison: None  Findings: Bones appear demineralized. Symmetric preserved hip and SI joints. No acute fracture, dislocation or bone destruction. Degenerative disc disease changes L4-L5 with disc space narrowing and endplate spur formation.  IMPRESSION: No acute bony abnormalities. Degenerative disc disease changes L4-L5.  Original Report Authenticated By: Lollie Marrow, M.D.     1. Paroxysmal SVT (supraventricular tachycardia)   2. Musculoskeletal pain       MDM  65yo obese AAF with PMH significant for DM CHF and PE who presents to the ED due to left lower back pain.  Onset about 5-7 days ago. Has had pain like this in the past. At that time her pain was attributed to MSK cause due to her large breasts causing strain on her back and she was referred for breast reduction but has not yet had the reduction. Pain constant since onset but gradually worsening. Worse with particular movements and positional changes. No hx kidney stones. No hematuria dysuria urgency or frequency. Doubt UTI/pyelo or stone. No bowel or bladder incontinence. Reflexes symmetric but 1/5 given hx bilat knee replacement surg. No clonus. Normal motor and sensory. Neg SLR. Not c/w spinal emergency. Pain worse with palpation of musculature. No hip pain or pain in hip with ROM. Doubt hip pathology. BP slightly elevated at triage. Normal HR in triage initially then pt had to move and exacerbated her pain and HR elevated for brief period but on my exam HR was consistently in 80s normal o2 and BP 140s systolically. This  presentation most c/w MSK soft tissue back pain. Giving dilaudid.   After dilaudid pain improving but medication made her nauseous. Given zofran.   At 9:01 AM pt reassessed. RN documented sat 89% on RA but pt asleep  and snoring and no wave form on monitor. Woke pt up sat repositioned and up to 93% pts pain improved and has had no dyspnea.   Pt with return of pain. Will place in CDU for pain mgmt with plan for pain control and d/c home with PCP f/u.   At 2:51 PM pt coming back to main ED bc now pt with persistent atrial tachycardia.Dilt ordered. Cards consulted. Discussed case with Dr. Anne Fu. He will see her in the ED. Trop neg.   Dr Anne Fu familiar with Ms T[place]te and says she has hx of intermittent SVT and has been referred for EP study in the past. They just try to control her rate c metop and dilt orally. He recs bolus dilt and if rate controlled give her oral dose 60mg  dilt and ok for d/c.   At 4:21 PM pt reassessed HR controlled 90s to 105. Given oral dose dilt. Pt feeling better will d/c home and have her f/u with Dr. Anne Fu and her pcp.   At 4:46 PM pt updated. HR 95. Pt calling for a ride home.   Verne Carrow, MD 05/02/11 [phone]

## 2011-05-02 NOTE — Consult Note (Signed)
Admit date: 05/02/2011 Referring Physician  Dr. Radford Pax Primary Physician Katy Apo, MD, MD Primary Cardiologist  Dr. Anne Fu Reason for Consultation  evaluation of atrial arrhythmia  HPI: 66 year old female with morbid obesity, history of atrial flutter/ SVT (previously seen and evaluated by Dr. Johney Frame of electrophysiology), with history of pulmonary embolisms in the past on chronic anticoagulation, chronic kidney disease here with back pain. She was in normal rhythm originally. Shortly after being in the emergency division, and atrial arrhythmia occurred which upon close inspection on EKG reveals what looks like atrial flutter at a heart rate of 130 beats per minute without any ST segment changes. On 06/17/2010, an EKG also demonstrates this underlying atrial flutter rhythm.  This atrial arrhythmia/atrial flutter is chronic. It has been in the past well rate controlled with both metoprolol and diltiazem. She currently is on 120 mg of diltiazem. I have asked Dr. Radford Pax to increase this to 240 mg by mouth. He has also started an IV drip which is quite reasonable right now.  [name] [name] is not complaining of any shortness of breath or chest pain. She is quite sleepy after obtaining pain medication.  PMH:   Past Medical History  Diagnosis Date  . Angina   . Heart murmur   . Shortness of breath   . Blood dyscrasia   . Mental disorder   . Hypertension   . CHF (congestive heart failure)   . 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 Blood transfusion   . GERD (gastroesophageal reflux disease)   . Arthritis   . Anxiety   . Dysrhythmia   . Pneumonia   . Diabetes mellitus   . Anemia   . Depression   . Chronic kidney disease, stage III (moderate)   . Pulmonary embolism     on lifelong coumadin    PSH:   Past Surgical History  Procedure Date  . Vascular surgery     vein removed in legs  . Dilation and curettage of uterus   . Joint replacement     both knees  . Tubal ligation   .  Shoulder surgery     right shoulder I& D secondary to septic joint   Allergies:  Shellfish allergy Prior to Admit Meds:   (Not in a hospital admission) Fam HX:   No family history on file. Social HX:    History   Social History  . Marital Status: Single    Spouse Name: N/A    Number of Children: N/A  . Years of Education: N/A   Occupational History  . Not on file.   Social History Main Topics  . Smoking status: Never Smoker   . Smokeless tobacco: Never Used  . Alcohol Use: No  . Drug Use: No  . Sexually Active: Yes -- Female partner(s)   Other Topics Concern  . Not on file   Social History Narrative  . No narrative on file     ROS:  All 11 ROS were addressed and are negative except what is stated in the HPI  Physical Exam: Blood pressure 170/111, pulse 129, temperature 98.3 F (36.8 C), temperature source Oral, resp. rate 20, height 5\' 4"  (1.626 m), SpO2 2.00%.    General: Well developed, well nourished, in no acute distress, sleepy Head: Eyes PERRLA, No xanthomas.   Normal cephalic and atramatic  Lungs:  Clear bilaterally to auscultation and percussion. Normal respiratory effort. No wheezes, no rales. Heart:  Tachy reg. Pulses are 2+ &  equal.            No carotid bruit. No JVD.  No abdominal bruits. No femoral bruits. Abdomen: Bowel sounds are positive, abdomen soft and non-tender without masses. No hepatosplenomegaly. Obese Msk:  Back normal, normal gait. Normal strength and tone for age. Extremities:    chronic edema.  DP +1 Neuro: Alert and oriented X 3, non-focal, MAE x 4 GU: Deferred Rectal: Deferred Psych:  Good affect, responds appropriately    Labs:   Lab Results  Component Value Date   WBC 6.7 05/02/2011   HGB 12.4 05/02/2011   HCT 37.7 05/02/2011   MCV 93.5 05/02/2011   PLT 315 05/02/2011    Lab 05/02/11 1324  NA 136  K 4.5  CL 99  CO2 26  BUN 36*  CREATININE 2.30*  CALCIUM 8.5  PROT --  BILITOT --  ALKPHOS --  ALT --  AST --  GLUCOSE 306*     No results found for this basename: PTT   Lab Results  Component Value Date   INR 2.36* 05/02/2011   INR 3.22* 02/07/2011   INR 3.11* 02/06/2011   Lab Results  Component Value Date   CKTOTAL 184* 02/03/2011   CKMB 3.3 02/03/2011   TROPONINI <0.30 02/03/2011      Radiology:  Dg Hip Complete Left  05/02/2011  *RADIOLOGY REPORT*  Clinical Data: Left hip and leg pain for 5 days  LEFT HIP - COMPLETE 2+ VIEW  Comparison: None  Findings: Bones appear demineralized. Symmetric preserved hip and SI joints. No acute fracture, dislocation or bone destruction. Degenerative disc disease changes L4-L5 with disc space narrowing and endplate spur formation.  IMPRESSION: No acute bony abnormalities. Degenerative disc disease changes L4-L5.  Original Report Authenticated By: Lollie Marrow, M.D.   Personally viewed.  EKG:  As above. Aflutter Personally viewed.   ASSESSMENT/PLAN:    67 year old with chronic atrial flutter, diabetes uncontrolled, morbid obesity, multifactorial dyspnea, chronic kidney disease, hypertension here with back pain.  -I agree with current IV diltiazem drip. Hopefully this will help improve with rate control of her underlying atrial flutter which in the past has been adequately controlled with both beta blocker and calcium channel blocker combination. -Increase her diltiazem from 120 mg once a day up to 240 mg once a day. This can be done as an outpatient. -Strongly encourage weight loss once again. Underlying sleep apnea likely contributing to atrial arrhythmias well. -Hypertension is better controlled with pain medication in the emergency room. Continue to adequately control.  -If she is admitted, I will be happy to follow along with admitting team.   Donato Schultz, MD  05/02/2011  3:26 PM

## 2011-05-02 NOTE — ED Notes (Signed)
Report given to [facility], RN on CDU. Pt preparing to transfer to CDU 7.

## 2011-05-02 NOTE — ED Notes (Signed)
Pt to move to [facility] side of ED room 6.

## 2011-05-02 NOTE — ED Provider Notes (Signed)
10:30 AM Patient with a hx sig for DM, arthritis, PE, CKD & CHF was placed in CDU on observation by Dr. Meredith Pel (resident wking w Dr. Radford Pax). Patient care resumed from blue side.  Patient is here for chronic back pain exacerbation pain management and has received 1 mg Dilaudid x 2. Patient re-evaluated and appears to still have significant pain, VSS, with no new complaints or concerns at this time. Plan per previous provider is to continue to manage pain and dc pt with PCP follow up (Dr. Nehemiah Settle).   On exam: Obese AAF, Afebrile, tachycardic (HR 111) reg rhythem, lungs CTAB, Chest & abd non-tender, lower extremities bilaterally w 2+ pitting edema, pain with passive internal and external rotation of LLE, tenderness to palpation of lateral hip, no calf tenderness. Question trochanteric bursitis vs referred lumbar pain.   12:30 PM Pt states her pain has spiked again, 1 Dilaudid given. HR still tachycardic, BP stable, NAD. Hip x ray ordered  1:30 PM Pt still tachycardic and will likely have to be admitted for pain mngt/futher evaluation. CBG 302, pt does not smell ketotic, CBC w diff, BMP, Coagulation factors (on coumadin) ordered. Dr. Radford Pax informed of pts stable tachycardia.   1:53 PM   Date: 05/02/2011  Rate: 130  Rhythm: sinus tachycardia, atrial fibrillation and Atrial tachycardia  QRS Axis: normal  Intervals: normal  ST/T Wave abnormalities: normal  Conduction Disutrbances:none  Narrative Interpretation:   Old EKG Reviewed: changes noted   2:22 PM Pt being moved back to the blue side for Cardizem drip (25mg /10 min)  to help rate control. Pt care to be resumed again by Dr. Meredith Pel and Dr. Radford Pax.          MDM Number of Diagnoses or Management Options      Jaci Carrel, PA-C 05/02/11 1524

## 2011-05-02 NOTE — ED Notes (Signed)
Pt has been experiencing L lower back pain radiating to her right hip. Her PCP, Dr Nehemiah Settle, referred her to see a renal doctor, Dr Lowell Guitar. She visited Dr Lowell Guitar yesterday. Pt states that he did blood work and urine samples. Pt state that the pain was unbearable this morning, therefore she called EMS.

## 2011-05-02 NOTE — ED Notes (Signed)
cardizem increased to 15 mg/hr

## 2011-05-02 NOTE — ED Notes (Signed)
CHO modified diet ordered. 

## 2011-05-02 NOTE — ED Notes (Signed)
MD at bedside. 

## 2011-05-02 NOTE — ED Notes (Signed)
Per MD, cardizem gtt changed to 5mg /hr

## 2011-05-02 NOTE — ED Notes (Signed)
Vital signs stable. 

## 2011-05-02 NOTE — ED Notes (Signed)
[name] [name], Consulting civil engineer, aware of pt's condition and assessed pt.  Pt w/snorous respirations - continues to open eyes to respond briefly.

## 2011-05-03 NOTE — Progress Notes (Signed)
Observation review is complete. 

## 2011-05-03 NOTE — ED Provider Notes (Signed)
I saw and evaluated the patient, reviewed the resident's note and I agree with the findings and plan.   .Face to face Exam:  General:  Awake HEENT:  Atraumatic Resp:  Normal effort Abd:  Nondistended Neuro:No focal weakness Lymph: No adenopathy   Nelia Shi, MD 05/03/11 2039

## 2011-05-21 ENCOUNTER — Emergency Department (HOSPITAL_COMMUNITY): Payer: Federal, State, Local not specified - PPO

## 2011-05-21 ENCOUNTER — Emergency Department (HOSPITAL_COMMUNITY)
Admission: EM | Admit: 2011-05-21 | Discharge: 2011-05-21 | Disposition: A | Discharge status: Home / Self Care | Payer: Federal, State, Local not specified - PPO | Attending: Emergency Medicine | Admitting: Emergency Medicine

## 2011-05-21 ENCOUNTER — Encounter (HOSPITAL_COMMUNITY): Payer: Self-pay | Admitting: Family Medicine

## 2011-05-21 DIAGNOSIS — I509 Heart failure, unspecified: Secondary | ICD-10-CM | POA: Insufficient documentation

## 2011-05-21 DIAGNOSIS — I129 Hypertensive chronic kidney disease with stage 1 through stage 4 chronic kidney disease, or unspecified chronic kidney disease: Secondary | ICD-10-CM | POA: Insufficient documentation

## 2011-05-21 DIAGNOSIS — M129 Arthropathy, unspecified: Secondary | ICD-10-CM | POA: Insufficient documentation

## 2011-05-21 DIAGNOSIS — E119 Type 2 diabetes mellitus without complications: Secondary | ICD-10-CM | POA: Insufficient documentation

## 2011-05-21 DIAGNOSIS — R269 Unspecified abnormalities of gait and mobility: Secondary | ICD-10-CM | POA: Insufficient documentation

## 2011-05-21 DIAGNOSIS — F341 Dysthymic disorder: Secondary | ICD-10-CM | POA: Insufficient documentation

## 2011-05-21 DIAGNOSIS — M79609 Pain in unspecified limb: Secondary | ICD-10-CM | POA: Insufficient documentation

## 2011-05-21 DIAGNOSIS — M79604 Pain in right leg: Secondary | ICD-10-CM

## 2011-05-21 DIAGNOSIS — K219 Gastro-esophageal reflux disease without esophagitis: Secondary | ICD-10-CM | POA: Insufficient documentation

## 2011-05-21 DIAGNOSIS — Z7901 Long term (current) use of anticoagulants: Secondary | ICD-10-CM | POA: Insufficient documentation

## 2011-05-21 DIAGNOSIS — N183 Chronic kidney disease, stage 3 unspecified: Secondary | ICD-10-CM | POA: Insufficient documentation

## 2011-05-21 DIAGNOSIS — Z79899 Other long term (current) drug therapy: Secondary | ICD-10-CM | POA: Insufficient documentation

## 2011-05-21 DIAGNOSIS — Z794 Long term (current) use of insulin: Secondary | ICD-10-CM | POA: Insufficient documentation

## 2011-05-21 DIAGNOSIS — R791 Abnormal coagulation profile: Secondary | ICD-10-CM | POA: Insufficient documentation

## 2011-05-21 DIAGNOSIS — Z86718 Personal history of other venous thrombosis and embolism: Secondary | ICD-10-CM | POA: Insufficient documentation

## 2011-05-21 LAB — DIFFERENTIAL
Basophils Absolute: 0 10*3/uL (ref 0.0–0.1)
Basophils Relative: 1 % (ref 0–1)
Eosinophils Absolute: 0.1 10*3/uL (ref 0.0–0.7)
Monocytes Absolute: 0.5 10*3/uL (ref 0.1–1.0)
Monocytes Relative: 7 % (ref 3–12)
Neutro Abs: 3.7 10*3/uL (ref 1.7–7.7)
Neutrophils Relative %: 53 % (ref 43–77)

## 2011-05-21 LAB — BASIC METABOLIC PANEL
BUN: 36 mg/dL — ABNORMAL HIGH (ref 6–23)
Chloride: 95 mEq/L — ABNORMAL LOW (ref 96–112)
Creatinine, Ser: 2.13 mg/dL — ABNORMAL HIGH (ref 0.50–1.10)
GFR calc Af Amer: 27 mL/min — ABNORMAL LOW (ref >90)
GFR calc non Af Amer: 24 mL/min — ABNORMAL LOW (ref >90)

## 2011-05-21 LAB — CBC
HCT: 34.6 % — ABNORMAL LOW (ref 36.0–46.0)
Hemoglobin: 11.8 g/dL — ABNORMAL LOW (ref 12.0–15.0)
MCH: 31.4 pg (ref 26.0–34.0)
MCHC: 34.1 g/dL (ref 30.0–36.0)
RDW: 14.7 % (ref 11.5–15.5)

## 2011-05-21 LAB — PROTIME-INR
INR: 5.49 (ref 0.00–1.49)
Prothrombin Time: 50.7 seconds — ABNORMAL HIGH (ref 11.6–15.2)

## 2011-05-21 MED ORDER — OXYCODONE-ACETAMINOPHEN 5-325 MG PO TABS
2.0000 | ORAL_TABLET | ORAL | Status: AC
  Administered 2011-05-21: 2 via ORAL
  Filled 2011-05-21: qty 2

## 2011-05-21 MED ORDER — HYDROMORPHONE HCL PF 1 MG/ML IJ SOLN
1.0000 mg | Freq: Once | INTRAMUSCULAR | Status: AC
  Administered 2011-05-21: 1 mg via INTRAVENOUS
  Filled 2011-05-21: qty 1

## 2011-05-21 MED ORDER — OXYCODONE-ACETAMINOPHEN 5-325 MG PO TABS: 1.0000 | ORAL_TABLET | Freq: Four times a day (QID) | ORAL | Status: AC | PRN

## 2011-05-21 NOTE — Progress Notes (Signed)
VASCULAR LAB PRELIMINARY  PRELIMINARY  PRELIMINARY  PRELIMINARY  Right lower extremity venous duplex completed.    Preliminary report:  No DVT or SVT noted in the right lower extremity.  [name] [name], 05/21/2011, 7:13 PM

## 2011-05-21 NOTE — ED Notes (Addendum)
INR 5.49 reported to Dr Dallie Dad

## 2011-05-21 NOTE — ED Notes (Signed)
Per EMS, pt complaining of right leg pain for 1 week. Hx of DVT. Also fell on rt side and hurt arm . Hx of rotator cuff injury. Pt also has knot on right hip that is old. sts pain there also.

## 2011-05-21 NOTE — ED Notes (Signed)
Critical lab called: INR 5.49 EDP and primary RN made aware

## 2011-05-27 NOTE — ED Provider Notes (Signed)
History     CSN: [id number]  Arrival date & time 05/21/11  1745   First MD Initiated Contact with Patient 05/21/11 1757      Chief Complaint  Patient presents with  . Leg Pain    (Consider location/radiation/quality/duration/timing/severity/associated sxs/prior Treatment)  HPI: Patient is a 67 y.o. female presenting with leg pain.  Leg Pain   Hx provided by the pt.  65 y.o. F on coumadin presenting with c/o Rt leg pain.  Pain is located in the Rt leg mostly on lateral aspect from her thigh to her upper calf, started gradually about 1 week ago and has been constant, progressive, severe, and min improved with OTC medication.  No associated weakness or numbness but pt c/o associated difficulty ambulating, as walking worsens her pain.  Pain is different from her prior DVT.  Pt has not been seen recently for her current pain or an INR check.  No known fall or injury.   Past Medical History  Diagnosis Date  . Angina   . Heart murmur   . Shortness of breath   . Blood dyscrasia   . Mental disorder   . Hypertension   . CHF (congestive heart failure)   . 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 Blood transfusion   . GERD (gastroesophageal reflux disease)   . Arthritis   . Anxiety   . Dysrhythmia   . Pneumonia   . Diabetes mellitus   . Anemia   . Depression   . Chronic kidney disease, stage III (moderate)   . 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 Vascular surgery     vein removed in legs  . Dilation and curettage of uterus   . Joint replacement     both knees  . Tubal ligation   . Shoulder surgery     right shoulder I& D secondary to septic joint    History reviewed. No pertinent family history.  History  Substance Use Topics  . Smoking status: Never Smoker   . Smokeless tobacco: Never Used  . Alcohol Use: No    OB History    Grav Para Term Preterm Abortions TAB SAB Ect Mult Living                   Review of Systems  Constitutional: Negative for fever and chills.  HENT: Negative for congestion, sore throat and rhinorrhea.   Eyes: Negative for pain and visual disturbance.  Respiratory: Negative for cough, shortness of breath and wheezing.   Cardiovascular: Negative for chest pain and palpitations.  Gastrointestinal: Negative for nausea, vomiting, abdominal pain, diarrhea and blood in stool.  Genitourinary: Negative for dysuria and hematuria.  Musculoskeletal: Positive for gait problem. Negative for back pain.  Skin: Negative for rash and wound.  Neurological: Negative for dizziness and headaches.  Psychiatric/Behavioral: Negative for confusion and agitation.  All other systems reviewed and are negative.    Allergies  Shellfish allergy  Home Medications   Current Outpatient Rx  Name Route Sig Dispense Refill  . ACETAMINOPHEN 500 MG PO TABS Oral Take 1,000 mg by mouth every 6 (six) hours as needed. For pain     . DILTIAZEM HCL ER COATED BEADS 120 MG PO CP24 Oral Take 240 mg by mouth daily.    . FUROSEMIDE 80 MG PO TABS Oral Take 80 mg by mouth 2 (two) times daily.      Marland Kitchen  GABAPENTIN 600 MG PO TABS Oral Take 600 mg by mouth 3 (three) times daily.    Marland Kitchen HYDROCODONE-ACETAMINOPHEN 5-500 MG PO TABS Oral Take 1 tablet by mouth 2 (two) times daily as needed. For pain    . INSULIN ASPART 100 UNIT/ML [place] SOLN Subcutaneous Inject 20 Units into the skin 2 (two) times daily. Per sliding scale    . INSULIN GLARGINE 100 UNIT/ML [place] SOLN Subcutaneous Inject 70 Units into the skin 2 (two) times daily.    Marland Kitchen MECLIZINE HCL 25 MG PO TABS Oral Take 25 mg by mouth 3 (three) times daily as needed. For dizziness    . METOPROLOL SUCCINATE ER 100 MG PO TB24 Oral Take 50 mg by mouth daily.      Marland Kitchen PANTOPRAZOLE SODIUM 40 MG PO TBEC Oral Take 40 mg by mouth daily.      Marland Kitchen POTASSIUM CHLORIDE ER 10 MEQ PO TBCR Oral Take 20 mEq by mouth daily.    Marland Kitchen PROMETHAZINE HCL 25 MG PO TABS Oral Take 25 mg by mouth every 4  (four) hours as needed. For nausea and vomiting    . SERTRALINE HCL 50 MG PO TABS Oral Take 50 mg by mouth daily.      Marland Kitchen SIMVASTATIN 40 MG PO TABS Oral Take 40 mg by mouth at bedtime.      . WARFARIN SODIUM 5 MG PO TABS Oral Take 7.5 mg by mouth daily.      Marland Kitchen ZOLPIDEM TARTRATE 10 MG PO TABS Oral Take 10 mg by mouth at bedtime as needed. For sleep     . OXYCODONE-ACETAMINOPHEN 5-325 MG PO TABS Oral Take 1 tablet by mouth every 6 (six) hours as needed for pain. 10 tablet 0    BP 132/71  Pulse 90  Temp(Src) 98.2 F (36.8 C) (Oral)  Resp 18  SpO2 94%  Physical Exam  Nursing note and vitals reviewed. Constitutional: She is oriented to person, place, and time. No distress.       Morbidly obese, uncomfortable but NAD, alert  HENT:  Head: Normocephalic and atraumatic.  Right Ear: External ear normal.  Left Ear: External ear normal.  Nose: Nose normal.  Eyes: Conjunctivae and EOM are normal. Pupils are equal, round, and reactive to light.  Neck: Normal range of motion. Neck supple.  Cardiovascular: Normal rate, regular rhythm and intact distal pulses.   No murmur heard. Pulmonary/Chest: Effort normal and breath sounds normal. No respiratory distress.  Abdominal: Soft. Bowel sounds are normal. There is no tenderness.       obese  Musculoskeletal:       RLE with mild TTP of superolateral calf to mid lateral thigh without erythema, sig edema; NL rom of hip, knee, ankle, and digits; NV intact  Neurological: She is alert and oriented to person, place, and time.  Skin: Skin is warm and dry. No rash noted. She is not diaphoretic.  Psychiatric: She has a normal mood and affect. Judgment normal.    ED Course  Procedures (including critical care time)  Labs Reviewed  PROTIME-INR - Abnormal; Notable for the following:    Prothrombin Time 50.7 (*)    INR 5.49 (*)    All other components within normal limits  CBC - Abnormal; Notable for the following:    RBC 3.76 (*)    Hemoglobin 11.8 (*)     HCT 34.6 (*)    All other components within normal limits  BASIC METABOLIC PANEL - Abnormal; Notable for the following:  Sodium 134 (*)    Chloride 95 (*)    Glucose, Bld 409 (*)    BUN 36 (*)    Creatinine, Ser 2.13 (*)    GFR calc non Af Amer 24 (*)    GFR calc Af Amer 27 (*)    All other components within normal limits  DIFFERENTIAL  LAB REPORT - SCANNED   Imaging:  DG Tibia/Fibula Right (Final result)   Result time:05/21/11 1945    Final result by Rad Results In Interface (05/21/11 19:45:30)    Narrative:   *RADIOLOGY REPORT*  Clinical Data: Fall. Right lateral leg pain.  RIGHT TIBIA AND FIBULA - 2 VIEW  Comparison: None.  Findings: Total knee prosthesis noted. There is mild inferior spurring of the medial malleolus.  No tubular or fibular fracture is observed.  IMPRESSION:  1. No acute bony findings.  Original Report Authenticated By: Dellia Cloud, M.D.            DG Femur Right (Final result)   Result time:05/21/11 [phone]    Final result by Rad Results In Interface (05/21/11 19:38:10)    Narrative:   *RADIOLOGY REPORT*  Clinical Data: Fall with right femur pain.  RIGHT FEMUR - 2 VIEW  Comparison: None  Findings: There is no evidence of acute fracture, subluxation or dislocation. A total knee replacement is identified. Minimal degenerative changes in the right hip noted. No focal bony lesions are present.  IMPRESSION: No acute abnormalities.  Original Report Authenticated By: Rosendo Gros, M.D.            DG Pelvis 1-2 Views (Final result)   Result time:05/21/11 1933    Final result by Rad Results In Interface (05/21/11 19:33:30)    Narrative:   *RADIOLOGY REPORT*  Clinical Data: Fall with right pelvic pain.  PELVIS - 1-2 VIEW  Comparison: None  Findings: No evidence of acute fracture, subluxation or dislocation identified.  No radio-opaque foreign bodies are present.  No focal bony lesions are noted.  Mild  degenerative changes of the lower lumbar spine and hips noted.  IMPRESSION: No evidence of acute abnormality.     RLE doppler:  Summary: No obvious evidence of deep vein or superficial thrombosis involving the right lower extremity and left common femoral vein.   1. Supratherapeutic INR   2. Right leg pain       MDM  66 y.o. F with h/o DVT on coumadin presenting with 1 wk of RLE pain without h/o trauma.  Exam as above, no Sn of infection, xrays without acute fx, RLE doppler without Sn of DVT and INR >5.  Doubt hematoma; pt's RLE compartments are soft.  MSK Et likely.  Will Tx with pain meds, hold coumadin dose and have pt f/u with PCP.  Return precautions reviewed.      Particia Lather, MD 05/27/11 [phone]

## 2011-05-28 NOTE — ED Provider Notes (Signed)
I saw and evaluated the patient, reviewed the resident's note and I agree with the findings and plan. No joint pain. Previous DVT on Coumadin. Her INR is supratherapeutic. The patient also feels different from her previous DVTs. She'll follow with her PCP. X-rays are negative. Doubt compartment syndrome  Juliet Rude. Rubin Payor, MD 05/28/11 [phone]

## 2011-06-10 ENCOUNTER — Emergency Department (HOSPITAL_COMMUNITY): Payer: Federal, State, Local not specified - PPO

## 2011-06-10 ENCOUNTER — Encounter (HOSPITAL_COMMUNITY): Payer: Self-pay | Admitting: Emergency Medicine

## 2011-06-10 ENCOUNTER — Emergency Department (HOSPITAL_COMMUNITY)
Admission: EM | Admit: 2011-06-10 | Discharge: 2011-06-11 | Disposition: A | Discharge status: Home / Self Care | Payer: Federal, State, Local not specified - PPO | Attending: Emergency Medicine | Admitting: Emergency Medicine

## 2011-06-10 DIAGNOSIS — R1110 Vomiting, unspecified: Secondary | ICD-10-CM | POA: Insufficient documentation

## 2011-06-10 DIAGNOSIS — Z7901 Long term (current) use of anticoagulants: Secondary | ICD-10-CM | POA: Insufficient documentation

## 2011-06-10 DIAGNOSIS — Z8611 Personal history of tuberculosis: Secondary | ICD-10-CM | POA: Insufficient documentation

## 2011-06-10 DIAGNOSIS — I129 Hypertensive chronic kidney disease with stage 1 through stage 4 chronic kidney disease, or unspecified chronic kidney disease: Secondary | ICD-10-CM | POA: Insufficient documentation

## 2011-06-10 DIAGNOSIS — Z794 Long term (current) use of insulin: Secondary | ICD-10-CM | POA: Insufficient documentation

## 2011-06-10 DIAGNOSIS — B349 Viral infection, unspecified: Secondary | ICD-10-CM

## 2011-06-10 DIAGNOSIS — N183 Chronic kidney disease, stage 3 unspecified: Secondary | ICD-10-CM | POA: Insufficient documentation

## 2011-06-10 DIAGNOSIS — E119 Type 2 diabetes mellitus without complications: Secondary | ICD-10-CM | POA: Insufficient documentation

## 2011-06-10 DIAGNOSIS — R109 Unspecified abdominal pain: Secondary | ICD-10-CM | POA: Insufficient documentation

## 2011-06-10 DIAGNOSIS — B9789 Other viral agents as the cause of diseases classified elsewhere: Secondary | ICD-10-CM | POA: Insufficient documentation

## 2011-06-10 DIAGNOSIS — Z86711 Personal history of pulmonary embolism: Secondary | ICD-10-CM | POA: Insufficient documentation

## 2011-06-10 DIAGNOSIS — K219 Gastro-esophageal reflux disease without esophagitis: Secondary | ICD-10-CM | POA: Insufficient documentation

## 2011-06-10 DIAGNOSIS — R197 Diarrhea, unspecified: Secondary | ICD-10-CM | POA: Insufficient documentation

## 2011-06-10 DIAGNOSIS — I509 Heart failure, unspecified: Secondary | ICD-10-CM | POA: Insufficient documentation

## 2011-06-10 LAB — CBC
MCHC: 34 g/dL (ref 30.0–36.0)
Platelets: 399 10*3/uL (ref 150–400)
RDW: 15.4 % (ref 11.5–15.5)
WBC: 7.1 10*3/uL (ref 4.0–10.5)

## 2011-06-10 MED ORDER — ONDANSETRON HCL 4 MG/2ML IJ SOLN
4.0000 mg | Freq: Once | INTRAMUSCULAR | Status: AC
  Administered 2011-06-11: 4 mg via INTRAVENOUS
  Filled 2011-06-10: qty 2

## 2011-06-10 MED ORDER — SODIUM CHLORIDE 0.9 % IV BOLUS (SEPSIS)
1000.0000 mL | Freq: Once | INTRAVENOUS | Status: AC
  Administered 2011-06-11: 1000 mL via INTRAVENOUS

## 2011-06-10 MED ORDER — MORPHINE SULFATE 4 MG/ML IJ SOLN
6.0000 mg | Freq: Once | INTRAMUSCULAR | Status: AC
  Administered 2011-06-11: 6 mg via INTRAVENOUS
  Filled 2011-06-10: qty 2

## 2011-06-10 NOTE — ED Notes (Signed)
Per EMS: Pt with chronic pain reports onset of abdominal pain coinciding with out of Percocet, reports taking tylenol also. Pt with pain in abd x 2 hours, one episode of vomiting. Ambulatory with walker at baseline.

## 2011-06-11 LAB — BASIC METABOLIC PANEL
Chloride: 100 mEq/L (ref 96–112)
GFR calc Af Amer: 36 mL/min — ABNORMAL LOW (ref >90)
GFR calc non Af Amer: 31 mL/min — ABNORMAL LOW (ref >90)
Potassium: 3.3 mEq/L — ABNORMAL LOW (ref 3.5–5.1)
Sodium: 138 mEq/L (ref 135–145)

## 2011-06-11 LAB — URINE MICROSCOPIC-ADD ON

## 2011-06-11 LAB — URINALYSIS, ROUTINE W REFLEX MICROSCOPIC
Bilirubin Urine: NEGATIVE
Leukocytes, UA: NEGATIVE
Nitrite: NEGATIVE
Specific Gravity, Urine: 1.018 (ref 1.005–1.030)
Urobilinogen, UA: 0.2 mg/dL (ref 0.0–1.0)
pH: 8 (ref 5.0–8.0)

## 2011-06-11 MED ORDER — PROMETHAZINE HCL 25 MG PO TABS: 25.0000 mg | ORAL_TABLET | Freq: Four times a day (QID) | ORAL | Status: DC | PRN

## 2011-06-11 NOTE — ED Provider Notes (Signed)
History     CSN: [id number]  Arrival date & time 06/10/11  2231   First MD Initiated Contact with Patient 06/10/11 2337      Chief Complaint  Patient presents with  . Abdominal Pain  . Emesis     Patient is a 66 y.o. female presenting with vomiting. The history is provided by the patient.  Emesis  This is a new problem. The current episode started 6 to 12 hours ago. The problem occurs 2 to 4 times per day. The problem has not changed since onset.The emesis has an appearance of stomach contents. There has been no fever. Associated symptoms include abdominal pain and diarrhea. Pertinent negatives include no chills, no cough, no fever, no myalgias and no URI.   patient reports onset of diarrhea around noon today with intermittent crampy type abdominal pain. States that she's had approximately > episodes of diarrhea since onset of symptoms. At approximately 8:00 tonight patient states she began to have nausea and vomiting as well. Reports approximately 4 episodes of vomiting since onset. Denies cough, fever, chest pain, shortness of breath or any urinary tract symptoms. Denies ill contacts.  Past Medical History  Diagnosis Date  . Angina   . Heart murmur   . Shortness of breath   . Blood dyscrasia   . Mental disorder   . Hypertension   . CHF (congestive heart failure)   . 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 Blood transfusion   . GERD (gastroesophageal reflux disease)   . Arthritis   . Anxiety   . Dysrhythmia   . Pneumonia   . Diabetes mellitus   . Anemia   . Depression   . Chronic kidney disease, stage III (moderate)   . 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 Vascular surgery     vein removed in legs  . Dilation and curettage of uterus   . Joint replacement     both knees  . Tubal ligation   . Shoulder surgery     right shoulder I& D secondary to septic joint    History reviewed. No pertinent family  history.  History  Substance Use Topics  . Smoking status: Never Smoker   . Smokeless tobacco: Never Used  . Alcohol Use: No    OB History    Grav Para Term Preterm Abortions TAB SAB Ect Mult Living                  Review of Systems  Constitutional: Negative.  Negative for fever and chills.  HENT: Negative.   Eyes: Negative.   Respiratory: Negative.  Negative for cough.   Cardiovascular: Negative.   Gastrointestinal: Positive for vomiting, abdominal pain and diarrhea.  Genitourinary: Negative.   Musculoskeletal: Negative.  Negative for myalgias.  Skin: Negative.   Neurological: Negative.   Hematological: Negative.   Psychiatric/Behavioral: Negative.     Allergies  Shellfish allergy  Home Medications   Current Outpatient Rx  Name Route Sig Dispense Refill  . DILTIAZEM HCL ER COATED BEADS 120 MG PO CP24 Oral Take 120 mg by mouth daily.     . FUROSEMIDE 80 MG PO TABS Oral Take 80 mg by mouth 2 (two) times daily.      Marland Kitchen GABAPENTIN 600 MG PO TABS Oral Take 600 mg by mouth 3 (three) times daily.    . INSULIN ASPART 100 UNIT/ML [place] SOLN Subcutaneous  Inject 20 Units into the skin 2 (two) times daily. Per sliding scale    . INSULIN GLARGINE 100 UNIT/ML [place] SOLN Subcutaneous Inject 80 Units into the skin 2 (two) times daily.     Marland Kitchen MECLIZINE HCL 25 MG PO TABS Oral Take 25 mg by mouth 3 (three) times daily as needed. For dizziness    . METOPROLOL SUCCINATE ER 100 MG PO TB24 Oral Take 50 mg by mouth daily.     . OXYCODONE-ACETAMINOPHEN 10-650 MG PO TABS Oral Take 1 tablet by mouth every 6 (six) hours as needed. For pain    . PANTOPRAZOLE SODIUM 40 MG PO TBEC Oral Take 40 mg by mouth daily.      Marland Kitchen POTASSIUM CHLORIDE ER 10 MEQ PO TBCR Oral Take 20 mEq by mouth 2 (two) times daily.     Marland Kitchen PROMETHAZINE HCL 25 MG PO TABS Oral Take 25 mg by mouth every 4 (four) hours as needed. For nausea and vomiting    . SERTRALINE HCL 50 MG PO TABS Oral Take 50 mg by mouth daily.      Marland Kitchen SIMVASTATIN 40  MG PO TABS Oral Take 40 mg by mouth at bedtime.      . WARFARIN SODIUM 5 MG PO TABS Oral Take 7.5 mg by mouth daily.      Marland Kitchen ZOLPIDEM TARTRATE 10 MG PO TABS Oral Take 10 mg by mouth at bedtime as needed. For sleep       BP 106/86  Pulse 91  Temp(Src) 99 F (37.2 C) (Oral)  Resp 24  SpO2 95%  Physical Exam  Constitutional: She is oriented to person, place, and time. She appears well-developed and well-nourished.  HENT:  Head: Normocephalic and atraumatic.  Eyes: Conjunctivae are normal.  Neck: Neck supple.  Cardiovascular: Normal rate and regular rhythm.   Pulmonary/Chest: Effort normal and breath sounds normal.  Abdominal: Soft. Bowel sounds are normal. There is no tenderness.       No focal abdominal TTP  Musculoskeletal: Normal range of motion.  Neurological: She is alert and oriented to person, place, and time.  Skin: Skin is warm and dry. No erythema.  Psychiatric: She has a normal mood and affect.    ED Course  Procedures   Patient reports feeling much better after IV fluids and medication. There have been no further episodes of diarrhea or vomiting since arrival to the department. Patient is tolerating PO fluids prior to discharge.We will plan for discharge home with medication for nausea and Bentyl for intermittent abdominal crampy type pain and encourage followup with her primary care physician. Patient is agreeable with plan.  Labs Reviewed  CBC - Abnormal; Notable for the following:    RBC 3.58 (*)    Hemoglobin 11.2 (*)    HCT 32.9 (*)    All other components within normal limits  BASIC METABOLIC PANEL - Abnormal; Notable for the following:    Potassium 3.3 (*)    Creatinine, Ser 1.71 (*)    GFR calc non Af Amer 31 (*)    GFR calc Af Amer 36 (*)    All other components within normal limits  URINALYSIS, ROUTINE W REFLEX MICROSCOPIC - Abnormal; Notable for the following:    APPearance CLOUDY (*)    Hgb urine dipstick SMALL (*)    All other components within  normal limits  URINE MICROSCOPIC-ADD ON - Abnormal; Notable for the following:    Squamous Epithelial / LPF MANY (*)    Bacteria, UA  MANY (*)    All other components within normal limits   Dg Abd Acute W/chest  06/11/2011  *RADIOLOGY REPORT*  Clinical Data: Sharp abdominal pain for 24 hours; vomiting.  ACUTE ABDOMEN SERIES (ABDOMEN 2 VIEW & CHEST 1 VIEW)  Comparison: Pelvic radiograph performed 05/21/2011, and chest radiograph performed 02/02/2011  Findings: The lungs are well-aerated.  Mildly increased density of both hemithoraces is thought to reflect overlying soft tissues. There is no evidence of focal opacification, pleural effusion or pneumothorax.  The cardiomediastinal silhouette is borderline normal in size.  The visualized bowel gas pattern is unremarkable.  Scattered fluid and air are seen within the colon; there is no evidence of small bowel dilatation to suggest obstruction.  No free intra-abdominal air is identified on the provided upright view.  Scattered clips are noted about the left upper quadrant.  No acute osseous abnormalities are seen; the sacroiliac joints are unremarkable in appearance.  Degenerative change is noted at the right humeral head, with subcortical cyst formation.  IMPRESSION:  1.  Unremarkable bowel gas pattern; no free intra-abdominal air seen. 2.  No acute cardiopulmonary process identified.  Original Report Authenticated By: Tonia Ghent, M.D.     No diagnosis found.    MDM  HPI/PE and clinical findings c/w 1.vomiting and diarrhea and diffuse intermittent abd pain  (afebrile, abdominal exam unremarkable, acute abdomen without acute findings. Improved after IV fluids and medication for pain and nausea, tolerating by mouth fluids at the time of discharge) 2. Viral syndrome       Leanne Chang, NP 06/13/11 2151

## 2011-06-11 NOTE — Discharge Instructions (Signed)
Please review the instructions below. You were seen in the emergency department tonight for your vomiting and diarrhea and intermittent abdominal pain. You've been given fluids and medication for pain and nausea and admit to  feeling better. The likely source of your symptoms is a viral syndrome. We are prescribing a short course of medication for your nausea. Take as directed. If the diarrhea persists you can get over-the-counter Imm and odium and take as instructed. You will to need to call Monday to arrange follow up with your primary care physician if your symptoms persist.          B.R.A.T. Diet Your doctor has recommended the B.R.A.T. diet for you or your child until the condition improves. This is often used to help control diarrhea and vomiting symptoms. If you or your child can tolerate clear liquids, you may have:  Bananas.   Rice.   Applesauce.   Toast (and other simple starches such as crackers, potatoes, noodles).  Be sure to avoid dairy products, meats, and fatty foods until symptoms are better. Fruit juices such as apple, grape, and prune juice can make diarrhea worse. Avoid these. Continue this diet for 2 days or as instructed by your caregiver. Document Released: 03/13/2005 Document Revised: 03/02/2011 Document Reviewed: 08/30/2006 Viral Syndrome You or your child has Viral Syndrome. It is the most common infection causing "colds" and infections in the nose, throat, sinuses, and breathing tubes. Sometimes the infection causes nausea, vomiting, or diarrhea. The germ that causes the infection is a virus. No antibiotic or other medicine will kill it. There are medicines that you can take to make you or your child more comfortable.  HOME CARE INSTRUCTIONS   Rest in bed until you start to feel better.   If you have diarrhea or vomiting, eat small amounts of crackers and toast. Soup is helpful.   Do not give aspirin or medicine that contains aspirin to children.   Only take  over-the-counter or prescription medicines for pain, discomfort, or fever as directed by your caregiver.  SEEK IMMEDIATE MEDICAL CARE IF:   You or your child has not improved within one week.   You or your child has pain that is not at least partially relieved by over-the-counter medicine.   Thick, colored mucus or blood is coughed up.   Discharge from the nose becomes thick yellow or green.   Diarrhea or vomiting gets worse.   There is any major change in your or your child's condition.   You or your child develops a skin rash, stiff neck, severe headache, or are unable to hold down food or fluid.   You or your child has an oral temperature above 102 F (38.9 C), not controlled by medicine.   Your baby is older than 3 months with a rectal temperature of 102 F (38.9 C) or higher.   Your baby is 42 months old or younger with a rectal temperature of 100.4 F (38 C) or higher.  Document Released: 02/26/2006 Document Revised: 03/02/2011 Document Reviewed: 02/27/2007 Diet for Diarrhea, Adult Having frequent, runny stools (diarrhea) has many causes. Diarrhea may be caused or worsened by food or drink. Diarrhea may be relieved by changing your diet. IF YOU ARE NOT TOLERATING SOLID FOODS:  Drink enough water and fluids to keep your urine clear or pale yellow.   Avoid sugary drinks and sodas as well as milk-based beverages.   Avoid beverages containing caffeine and alcohol.   You may try rehydrating beverages. You can  make your own by following this recipe:    tsp table salt.    tsp baking soda.   ? tsp salt substitute (potassium chloride).   1 tbs + 1 tsp sugar.   1 qt water.  As your stools become more solid, you can start eating solid foods. Add foods one at a time. If a certain food causes your diarrhea to get worse, avoid that food and try other foods. A low fiber, low-fat, and lactose-free diet is recommended. Small, frequent meals may be better tolerated.   Starches  Allowed:  White, [place], and pita breads, plain rolls, buns, bagels. Plain muffins, matzo. Soda, saltine, or graham crackers. Pretzels, melba toast, zwieback. Cooked cereals made with water: cornmeal, farina, cream cereals. Dry cereals: refined corn, wheat, rice. Potatoes prepared any way without skins, refined macaroni, spaghetti, noodles, refined rice.   Avoid:  Bread, rolls, or crackers made with whole wheat, multi-grains, rye, bran seeds, nuts, or coconut. Corn tortillas or taco shells. Cereals containing whole grains, multi-grains, bran, coconut, nuts, or raisins. Cooked or dry oatmeal. Coarse wheat cereals, granola. Cereals advertised as "high-fiber." Potato skins. Whole grain pasta, wild or brown rice. Popcorn. Sweet potatoes/yams. Sweet rolls, doughnuts, waffles, pancakes, sweet breads.  Vegetables  Allowed: Strained tomato and vegetable juices. Most well-cooked and canned vegetables without seeds. Fresh: Tender lettuce, cucumber without the skin, cabbage, spinach, bean sprouts.   Avoid: Fresh, cooked, or canned: Artichokes, baked beans, beet greens, broccoli, Brussels sprouts, corn, kale, legumes, peas, sweet potatoes. Cooked: Green or red cabbage, spinach. Avoid large servings of any vegetables, because vegetables shrink when cooked, and they contain more fiber per serving than fresh vegetables.  Fruit  Allowed: All fruit juices except prune juice. Cooked or canned: Apricots, applesauce, cantaloupe, cherries, fruit cocktail, grapefruit, grapes, kiwi, mandarin oranges, peaches, pears, plums, watermelon. Fresh: Apples without skin, ripe banana, grapes, cantaloupe, cherries, grapefruit, peaches, oranges, plums. Keep servings limited to  cup or 1 piece.   Avoid: Fresh: Apple with skin, apricots, mango, pears, raspberries, strawberries. Prune juice, stewed or dried prunes. Dried fruits, raisins, dates. Large servings of all fresh fruits.  Meat and Meat Substitutes  Allowed: Ground  or well-cooked tender beef, ham, veal, lamb, pork, or poultry. Eggs, plain cheese. Fish, oysters, shrimp, lobster, other seafoods. Liver, organ meats.   Avoid: Tough, fibrous meats with gristle. Peanut butter, smooth or chunky. Cheese, nuts, seeds, legumes, dried peas, beans, lentils.  Milk  Allowed: Yogurt, lactose-free milk, kefir, drinkable yogurt, buttermilk, soy milk.   Avoid: Milk, chocolate milk, beverages made with milk, such as milk shakes.  Soups  Allowed: Bouillon, broth, or soups made from allowed foods. Any strained soup.   Avoid: Soups made from vegetables that are not allowed, cream or milk-based soups.  Desserts and Sweets  Allowed: Sugar-free gelatin, sugar-free frozen ice pops made without sugar alcohol.   Avoid: Plain cakes and cookies, pie made with allowed fruit, pudding, custard, cream pie. Gelatin, fruit, ice, sherbet, frozen ice pops. Ice cream, ice milk without nuts. Plain hard candy, honey, jelly, molasses, syrup, sugar, chocolate syrup, gumdrops, marshmallows.  Fats and Oils  Allowed: Avoid any fats and oils.   Avoid: Seeds, nuts, olives, avocados. Margarine, butter, cream, mayonnaise, salad oils, plain salad dressings made from allowed foods. Plain gravy, crisp bacon without rind.  Beverages  Allowed: Water, decaffeinated teas, oral rehydration solutions, sugar-free beverages.   Avoid: Fruit juices, caffeinated beverages (coffee, tea, soda or pop), alcohol, sports drinks, or lemon-lime soda or pop.  Condiments  Allowed: Ketchup, mustard, horseradish, vinegar, cream sauce, cheese sauce, cocoa powder. Spices in moderation: allspice, basil, bay leaves, celery powder or leaves, cinnamon, cumin powder, curry powder, ginger, mace, marjoram, onion or garlic powder, oregano, paprika, parsley flakes, ground pepper, rosemary, sage, savory, tarragon, thyme, turmeric.   Avoid: Coconut, honey.  Weight Monitoring: Weigh yourself every day. You should weigh yourself in  the morning after you urinate and before you eat breakfast. Wear the same amount of clothing when you weigh yourself. Record your weight daily. Bring your recorded weights to your clinic visits. Tell your caregiver right away if you have gained 3 lb/1.4 kg or more in 1 day, 5 lb/2.3 kg in a week, or whatever amount you were told to report. SEEK IMMEDIATE MEDICAL CARE IF:   You are unable to keep fluids down.   You start to throw up (vomit) or diarrhea keeps coming back (persistent).   Abdominal pain develops, increases, or can be felt in one place (localizes).   You have an oral temperature above 102 F (38.9 C), not controlled by medicine.   Diarrhea contains blood or mucus.   You develop excessive weakness, dizziness, fainting, or extreme thirst.  MAKE SURE YOU:   Understand these instructions.   Will watch your condition.   Will get help right away if you are not doing well or get worse.  Document Released: 06/03/2003 Document Revised: 03/02/2011 Document Reviewed: 09/24/2008 ExitCare Patient Information 2012 ExitCaand a and and is re, LL a  also oralC.

## 2011-06-11 NOTE — ED Notes (Signed)
Patient is alert and oriented x3.  She was given DC instructions and follow up visit instructions.  Patient gave verbal understanding. She was DC via wheelchair to home.  V/S stable.  SHe was not showing any signs of distress on DC 

## 2011-06-14 NOTE — ED Provider Notes (Signed)
Medical screening examination/treatment/procedure(s) were performed by non-physician practitioner and as supervising physician I was immediately available for consultation/collaboration.   Jayah Balthazar, MD 06/14/11 1529 

## 2011-08-18 ENCOUNTER — Other Ambulatory Visit: Payer: Self-pay | Admitting: Neurological Surgery

## 2011-08-18 DIAGNOSIS — M47816 Spondylosis without myelopathy or radiculopathy, lumbar region: Secondary | ICD-10-CM

## 2011-08-29 ENCOUNTER — Ambulatory Visit
Admission: RE | Admit: 2011-08-29 | Discharge: 2011-08-29 | Disposition: A | Discharge status: Home / Self Care | Payer: Federal, State, Local not specified - PPO | Source: Ambulatory Visit | Attending: Neurological Surgery | Admitting: Neurological Surgery

## 2011-08-29 DIAGNOSIS — M47816 Spondylosis without myelopathy or radiculopathy, lumbar region: Secondary | ICD-10-CM

## 2011-08-30 ENCOUNTER — Other Ambulatory Visit: Payer: Federal, State, Local not specified - PPO

## 2011-10-20 ENCOUNTER — Encounter (HOSPITAL_COMMUNITY): Payer: Self-pay | Admitting: Pharmacy Technician

## 2011-10-31 ENCOUNTER — Encounter (HOSPITAL_COMMUNITY)
Admission: RE | Admit: 2011-10-31 | Discharge: 2011-10-31 | Disposition: A | Discharge status: Home / Self Care | Payer: Federal, State, Local not specified - PPO | Source: Ambulatory Visit | Attending: Specialist | Admitting: Specialist

## 2011-10-31 ENCOUNTER — Encounter (HOSPITAL_COMMUNITY): Payer: Self-pay

## 2011-10-31 HISTORY — DX: Sciatica, unspecified side: M54.30

## 2011-10-31 HISTORY — DX: Other chronic pain: G89.29

## 2011-10-31 HISTORY — DX: Zoster without complications: B02.9

## 2011-10-31 HISTORY — DX: Gout, unspecified: M10.9

## 2011-10-31 HISTORY — DX: Chronic kidney disease, unspecified: N18.9

## 2011-10-31 HISTORY — DX: Dorsalgia, unspecified: M54.9

## 2011-10-31 LAB — BASIC METABOLIC PANEL
BUN: 31 mg/dL — ABNORMAL HIGH (ref 6–23)
CO2: 25 mEq/L (ref 19–32)
Calcium: 8.5 mg/dL (ref 8.4–10.5)
GFR calc non Af Amer: 29 mL/min — ABNORMAL LOW (ref >90)
Glucose, Bld: 132 mg/dL — ABNORMAL HIGH (ref 70–99)

## 2011-10-31 LAB — SURGICAL PCR SCREEN: MRSA, PCR: NEGATIVE

## 2011-10-31 LAB — CBC
MCH: 30.8 pg (ref 26.0–34.0)
MCHC: 33.4 g/dL (ref 30.0–36.0)
MCV: 92.2 fL (ref 78.0–100.0)
Platelets: 243 10*3/uL (ref 150–400)

## 2011-10-31 NOTE — Progress Notes (Signed)
Contacted Ginger with Dr. Charlesetta Garibaldi office requested preop order for consent.

## 2011-10-31 NOTE — Pre-Procedure Instructions (Signed)
20 KARRYN KOSINSKI  10/31/2011   Your procedure is scheduled on:  Monday November 06, 2011  Report to [facility] at 5:30 AM.  Call this number if you have problems the morning of surgery: [phone]   Remember:   Do not eat food or drink:After Midnight.      Take these medicines the morning of surgery with A SIP OF WATER: cardizem, gabapentin, antivert, metoprolol, percocet, zoloft, phenergan (if needed)   Do not wear jewelry, make-up or nail polish.  Do not wear lotions, powders, or perfumes. You may wear deodorant.  Do not shave 48 hours prior to surgery. Men may shave face and neck.  Do not bring valuables to the hospital.  Contacts, dentures or bridgework may not be worn into surgery.  Leave suitcase in the car. After surgery it may be brought to your room.  For patients admitted to the hospital, checkout time is 11:00 AM the day of discharge.   Patients discharged the day of surgery will not be allowed to drive home.  Name and phone number of your driver: Novia Lansberry [phone]  Special Instructions: CHG Shower Use Special Wash: 1/2 bottle night before surgery and 1/2 bottle morning of surgery.   Please read over the following fact sheets that you were given: Pain Booklet, Coughing and Deep Breathing, MRSA Information and Surgical Site Infection Prevention

## 2011-10-31 NOTE — Progress Notes (Addendum)
Received cardiac clearance letter.  Contacted Dr. Anne Fu office and left message for Verdon Cummins requesting guidance regarding patients coumadin regime. Contacted Dr. Windy Fast Polite's office and spoke with [facility] requesting guidance regarding patients coumadin regime.Marland Kitchen

## 2011-10-31 NOTE — Progress Notes (Signed)
Contacted Dr. Leonel Ramsay office, spoke with Ginger, requested copy of clearance note from patients cardiologist, Dr. Anne Fu and primary Dr. Nehemiah Settle, also requested guidance regarding patients coumadin regime.

## 2011-11-01 LAB — APTT: aPTT: 36 seconds (ref 24–37)

## 2011-11-01 NOTE — Progress Notes (Signed)
Attempted to reach Dr. Idelle Crouch office to request orders for surgery.  Answering service states office left early.

## 2011-11-01 NOTE — Progress Notes (Signed)
[facility] call [facility] with [facility] Idelle Crouch office.  Ok for patient to stop coumadin for surgery 11/06/11

## 2011-11-05 NOTE — H&P (Signed)
[name] [name] is an 66 y.o. female.   Chief Complaint: Severe macromastia HPI: Increased back and shoulder pain intertrigo  Past Medical History  Diagnosis Date  . Angina   . Heart murmur   . Shortness of breath   . Blood dyscrasia   . Mental disorder   . Hypertension   . CHF (congestive heart failure)   . 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 Blood transfusion   . GERD (gastroesophageal reflux disease)   . Arthritis   . Anxiety   . Diabetes mellitus   . Anemia   . Depression   . Chronic kidney disease, stage III (moderate)   . Pulmonary embolism     on lifelong coumadin  . RENAL INSUFFICIENCY, CHRONIC 03/19/2010  . Pneumonia     hx of bronchitis  . Chronic back pain   . Gout     hx of left foot  . Shingles   . Sciatic nerve pain     right leg  . Dysrhythmia     sees Dr.  Anne Fu    Past Surgical History  Procedure Date  . Vascular surgery     vein removed in legs  . Dilation and curettage of uterus   . Joint replacement     both knees  . Tubal ligation   . Shoulder surgery     right shoulder I& D secondary to septic joint  . Laser ablation     "for dysrhythmia's approx. 4 years ago"    No family history on file. Social History:  reports that she has never smoked. She has never used smokeless tobacco. She reports that she does not drink alcohol or use illicit drugs.  Allergies:  Allergies  Allergen Reactions  . Other Swelling    Start hurting  Anything acidy  . Shellfish Allergy Nausea And Vomiting    Abdominal pain    No prescriptions prior to admission    No results found for this or any previous visit (from the past 48 hour(s)). No results found.  Review of Systems  Constitutional: Negative.   HENT: Positive for neck pain.   Eyes: Negative.   Respiratory: Negative.   Cardiovascular: Negative.   Gastrointestinal: Negative.   Genitourinary: Negative.   Musculoskeletal: Positive for back pain.  Skin: Positive for rash.  Neurological:  Positive for tingling and headaches.  Endo/Heme/Allergies: Negative.   Psychiatric/Behavioral: Negative.     There were no vitals taken for this visit. Physical Exam   Assessment/Plan Severe macromastia  For bilateral breast reductions  Auset Fritzler L 11/05/2011, 4:07 PM

## 2011-11-05 NOTE — H&P (Signed)
[name] [name] is an 66 y.o. female.   Chief Complaint: Increased bacl and shoulder pain HPI: pain has increased with intertrigo and pitting at shouders  Past Medical History  Diagnosis Date  . Angina   . Heart murmur   . Shortness of breath   . Blood dyscrasia   . Mental disorder   . Hypertension   . CHF (congestive heart failure)   . 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 Blood transfusion   . GERD (gastroesophageal reflux disease)   . Arthritis   . Anxiety   . Diabetes mellitus   . Anemia   . Depression   . Chronic kidney disease, stage III (moderate)   . Pulmonary embolism     on lifelong coumadin  . RENAL INSUFFICIENCY, CHRONIC 03/19/2010  . Pneumonia     hx of bronchitis  . Chronic back pain   . Gout     hx of left foot  . Shingles   . Sciatic nerve pain     right leg  . Dysrhythmia     sees Dr.  Anne Fu    Past Surgical History  Procedure Date  . Vascular surgery     vein removed in legs  . Dilation and curettage of uterus   . Joint replacement     both knees  . Tubal ligation   . Shoulder surgery     right shoulder I& D secondary to septic joint  . Laser ablation     "for dysrhythmia's approx. 4 years ago"    No family history on file. Social History:  reports that she has never smoked. She has never used smokeless tobacco. She reports that she does not drink alcohol or use illicit drugs.  Allergies:  Allergies  Allergen Reactions  . Other Swelling    Start hurting  Anything acidy  . Shellfish Allergy Nausea And Vomiting    Abdominal pain    No prescriptions prior to admission    No results found for this or any previous visit (from the past 48 hour(s)). No results found.  Review of Systems  HENT: Positive for neck pain.   Eyes: Negative.   Respiratory: Negative.   Cardiovascular: Negative.   Gastrointestinal: Negative.   Genitourinary: Negative.   Neurological: Positive for tingling, weakness and headaches.  Endo/Heme/Allergies:  Negative.   Psychiatric/Behavioral: Negative.     There were no vitals taken for this visit. Physical Exam   Assessment/Plan Severe macromastia for bilateral breast reductions  Tydus Sanmiguel L 11/05/2011, 3:51 PM

## 2011-11-06 ENCOUNTER — Ambulatory Visit (HOSPITAL_COMMUNITY)
Admission: RE | Admit: 2011-11-06 | Discharge: 2011-11-07 | Disposition: A | Discharge status: Home / Self Care | Payer: Federal, State, Local not specified - PPO | Source: Ambulatory Visit | Attending: Specialist | Admitting: Specialist

## 2011-11-06 ENCOUNTER — Encounter (HOSPITAL_COMMUNITY)
Admission: RE | Disposition: A | Discharge status: Home / Self Care | Payer: Self-pay | Source: Ambulatory Visit | Attending: Specialist

## 2011-11-06 ENCOUNTER — Encounter (HOSPITAL_COMMUNITY): Payer: Self-pay | Admitting: Anesthesiology

## 2011-11-06 ENCOUNTER — Ambulatory Visit (HOSPITAL_COMMUNITY): Payer: Federal, State, Local not specified - PPO | Admitting: Anesthesiology

## 2011-11-06 ENCOUNTER — Encounter (HOSPITAL_COMMUNITY): Payer: Self-pay | Admitting: General Practice

## 2011-11-06 DIAGNOSIS — G8929 Other chronic pain: Secondary | ICD-10-CM | POA: Insufficient documentation

## 2011-11-06 DIAGNOSIS — E119 Type 2 diabetes mellitus without complications: Secondary | ICD-10-CM | POA: Insufficient documentation

## 2011-11-06 DIAGNOSIS — M549 Dorsalgia, unspecified: Secondary | ICD-10-CM | POA: Insufficient documentation

## 2011-11-06 DIAGNOSIS — I129 Hypertensive chronic kidney disease with stage 1 through stage 4 chronic kidney disease, or unspecified chronic kidney disease: Secondary | ICD-10-CM | POA: Insufficient documentation

## 2011-11-06 DIAGNOSIS — N183 Chronic kidney disease, stage 3 unspecified: Secondary | ICD-10-CM | POA: Insufficient documentation

## 2011-11-06 DIAGNOSIS — Z7901 Long term (current) use of anticoagulants: Secondary | ICD-10-CM | POA: Insufficient documentation

## 2011-11-06 DIAGNOSIS — Z86711 Personal history of pulmonary embolism: Secondary | ICD-10-CM | POA: Insufficient documentation

## 2011-11-06 DIAGNOSIS — N62 Hypertrophy of breast: Secondary | ICD-10-CM | POA: Insufficient documentation

## 2011-11-06 DIAGNOSIS — K219 Gastro-esophageal reflux disease without esophagitis: Secondary | ICD-10-CM | POA: Insufficient documentation

## 2011-11-06 DIAGNOSIS — Z01812 Encounter for preprocedural laboratory examination: Secondary | ICD-10-CM | POA: Insufficient documentation

## 2011-11-06 DIAGNOSIS — M25519 Pain in unspecified shoulder: Secondary | ICD-10-CM | POA: Insufficient documentation

## 2011-11-06 HISTORY — PX: OTHER SURGICAL HISTORY: SHX169

## 2011-11-06 HISTORY — PX: BREAST REDUCTION SURGERY: SHX8

## 2011-11-06 LAB — CREATININE, SERUM
Creatinine, Ser: 2.51 mg/dL — ABNORMAL HIGH (ref 0.50–1.10)
GFR calc Af Amer: 22 mL/min — ABNORMAL LOW (ref >90)

## 2011-11-06 LAB — CBC
Platelets: 266 10*3/uL (ref 150–400)
RBC: 3.76 MIL/uL — ABNORMAL LOW (ref 3.87–5.11)
RDW: 15.7 % — ABNORMAL HIGH (ref 11.5–15.5)
WBC: 8.1 10*3/uL (ref 4.0–10.5)

## 2011-11-06 LAB — APTT: aPTT: 26 seconds (ref 24–37)

## 2011-11-06 LAB — GLUCOSE, CAPILLARY
Glucose-Capillary: 187 mg/dL — ABNORMAL HIGH (ref 70–99)
Glucose-Capillary: 151 mg/dL — ABNORMAL HIGH (ref 70–99)

## 2011-11-06 LAB — PROTIME-INR: INR: 1.12 (ref 0.00–1.49)

## 2011-11-06 SURGERY — MAMMOPLASTY, REDUCTION
Anesthesia: General | Site: Breast | Laterality: Bilateral | Wound class: Clean

## 2011-11-06 MED ORDER — FUROSEMIDE 80 MG PO TABS
80.0000 mg | ORAL_TABLET | Freq: Two times a day (BID) | ORAL | Status: DC
  Administered 2011-11-06 – 2011-11-07 (×2): 80 mg via ORAL
  Filled 2011-11-06 (×4): qty 1

## 2011-11-06 MED ORDER — ZOLPIDEM TARTRATE 5 MG PO TABS
5.0000 mg | ORAL_TABLET | Freq: Every day | ORAL | Status: DC
  Administered 2011-11-06: 5 mg via ORAL
  Filled 2011-11-06: qty 1

## 2011-11-06 MED ORDER — MORPHINE SULFATE 4 MG/ML IJ SOLN
4.0000 mg | INTRAMUSCULAR | Status: DC | PRN
  Administered 2011-11-06: 4 mg via INTRAVENOUS
  Filled 2011-11-06: qty 1

## 2011-11-06 MED ORDER — GLYCOPYRROLATE 0.2 MG/ML IJ SOLN
INTRAMUSCULAR | Status: DC | PRN
  Administered 2011-11-06: .6 mg via INTRAVENOUS

## 2011-11-06 MED ORDER — HYDROCODONE-ACETAMINOPHEN 5-325 MG PO TABS
1.0000 | ORAL_TABLET | ORAL | Status: DC | PRN
  Administered 2011-11-06 – 2011-11-07 (×4): 2 via ORAL
  Filled 2011-11-06 (×3): qty 2

## 2011-11-06 MED ORDER — PROMETHAZINE HCL 25 MG PO TABS: 25.0000 mg | ORAL_TABLET | Freq: Four times a day (QID) | ORAL | Status: DC | PRN

## 2011-11-06 MED ORDER — OXYCODONE-ACETAMINOPHEN 10-650 MG PO TABS: 1.0000 | ORAL_TABLET | Freq: Four times a day (QID) | ORAL | Status: DC | PRN

## 2011-11-06 MED ORDER — VECURONIUM BROMIDE 10 MG IV SOLR
INTRAVENOUS | Status: DC | PRN
  Administered 2011-11-06: 1 mg via INTRAVENOUS
  Administered 2011-11-06: 2 mg via INTRAVENOUS

## 2011-11-06 MED ORDER — DILTIAZEM HCL ER COATED BEADS 120 MG PO CP24
120.0000 mg | ORAL_CAPSULE | Freq: Every day | ORAL | Status: DC
  Administered 2011-11-07: 120 mg via ORAL
  Filled 2011-11-06: qty 1

## 2011-11-06 MED ORDER — ONDANSETRON HCL 4 MG/2ML IJ SOLN
INTRAMUSCULAR | Status: DC | PRN
  Administered 2011-11-06: 4 mg via INTRAVENOUS

## 2011-11-06 MED ORDER — FEBUXOSTAT 40 MG PO TABS
80.0000 mg | ORAL_TABLET | Freq: Every day | ORAL | Status: DC
  Administered 2011-11-06 – 2011-11-07 (×2): 80 mg via ORAL
  Filled 2011-11-06 (×2): qty 2

## 2011-11-06 MED ORDER — LIDOCAINE HCL 1 % IJ SOLN
INTRAMUSCULAR | Status: DC | PRN
  Administered 2011-11-06: 100 mg via INTRADERMAL

## 2011-11-06 MED ORDER — METOPROLOL SUCCINATE ER 25 MG PO TB24
25.0000 mg | ORAL_TABLET | Freq: Every day | ORAL | Status: DC
  Administered 2011-11-07: 25 mg via ORAL
  Filled 2011-11-06: qty 1

## 2011-11-06 MED ORDER — HYDROCODONE-ACETAMINOPHEN 5-325 MG PO TABS
ORAL_TABLET | ORAL | Status: AC
  Filled 2011-11-06: qty 2

## 2011-11-06 MED ORDER — INSULIN ASPART 100 UNIT/ML ~~LOC~~ SOLN
30.0000 [IU] | Freq: Two times a day (BID) | SUBCUTANEOUS | Status: DC
  Administered 2011-11-06 – 2011-11-07 (×2): 30 [IU] via SUBCUTANEOUS

## 2011-11-06 MED ORDER — INSULIN GLARGINE 100 UNIT/ML ~~LOC~~ SOLN
80.0000 [IU] | Freq: Two times a day (BID) | SUBCUTANEOUS | Status: DC
  Administered 2011-11-06 – 2011-11-07 (×2): 80 [IU] via SUBCUTANEOUS

## 2011-11-06 MED ORDER — NEOSTIGMINE METHYLSULFATE 1 MG/ML IJ SOLN
INTRAMUSCULAR | Status: DC | PRN
  Administered 2011-11-06: 4 mg via INTRAVENOUS

## 2011-11-06 MED ORDER — FENTANYL CITRATE 0.05 MG/ML IJ SOLN
INTRAMUSCULAR | Status: DC | PRN
  Administered 2011-11-06 (×7): 50 ug via INTRAVENOUS

## 2011-11-06 MED ORDER — PHENYLEPHRINE HCL 10 MG/ML IJ SOLN
INTRAMUSCULAR | Status: DC | PRN
  Administered 2011-11-06: 50 ug via INTRAVENOUS
  Administered 2011-11-06 (×2): 100 ug via INTRAVENOUS
  Administered 2011-11-06: 50 ug via INTRAVENOUS
  Administered 2011-11-06 (×4): 100 ug via INTRAVENOUS

## 2011-11-06 MED ORDER — OXYCODONE-ACETAMINOPHEN 5-325 MG PO TABS: 2.0000 | ORAL_TABLET | Freq: Four times a day (QID) | ORAL | Status: DC | PRN

## 2011-11-06 MED ORDER — MEPERIDINE HCL 25 MG/ML IJ SOLN: 6.2500 mg | INTRAMUSCULAR | Status: DC | PRN

## 2011-11-06 MED ORDER — PROMETHAZINE HCL 25 MG/ML IJ SOLN: 6.2500 mg | INTRAMUSCULAR | Status: DC | PRN

## 2011-11-06 MED ORDER — CEFAZOLIN SODIUM-DEXTROSE 2-3 GM-% IV SOLR
2.0000 g | INTRAVENOUS | Status: AC
  Administered 2011-11-06: 3 g via INTRAVENOUS

## 2011-11-06 MED ORDER — ROCURONIUM BROMIDE 100 MG/10ML IV SOLN
INTRAVENOUS | Status: DC | PRN
  Administered 2011-11-06: 50 mg via INTRAVENOUS

## 2011-11-06 MED ORDER — PROPOFOL 10 MG/ML IV EMUL
INTRAVENOUS | Status: DC | PRN
  Administered 2011-11-06: 200 mg via INTRAVENOUS

## 2011-11-06 MED ORDER — MIDAZOLAM HCL 5 MG/5ML IJ SOLN
INTRAMUSCULAR | Status: DC | PRN
  Administered 2011-11-06: 2 mg via INTRAVENOUS

## 2011-11-06 MED ORDER — HYDROMORPHONE HCL PF 1 MG/ML IJ SOLN
0.2500 mg | INTRAMUSCULAR | Status: DC | PRN
  Administered 2011-11-06 (×2): 0.5 mg via INTRAVENOUS

## 2011-11-06 MED ORDER — CEFAZOLIN SODIUM 1-5 GM-% IV SOLN
1.0000 g | Freq: Four times a day (QID) | INTRAVENOUS | Status: AC
  Administered 2011-11-06 – 2011-11-07 (×3): 1 g via INTRAVENOUS
  Filled 2011-11-06 (×3): qty 50

## 2011-11-06 MED ORDER — HYDROMORPHONE HCL PF 1 MG/ML IJ SOLN
INTRAMUSCULAR | Status: AC
  Filled 2011-11-06: qty 1

## 2011-11-06 MED ORDER — POTASSIUM CHLORIDE ER 10 MEQ PO TBCR
20.0000 meq | EXTENDED_RELEASE_TABLET | Freq: Two times a day (BID) | ORAL | Status: DC
  Administered 2011-11-06 – 2011-11-07 (×2): 20 meq via ORAL
  Filled 2011-11-06 (×5): qty 2

## 2011-11-06 MED ORDER — ONDANSETRON HCL 4 MG PO TABS: 4.0000 mg | ORAL_TABLET | Freq: Four times a day (QID) | ORAL | Status: DC | PRN

## 2011-11-06 MED ORDER — LACTATED RINGERS IV SOLN
INTRAVENOUS | Status: DC | PRN
  Administered 2011-11-06 (×3): via INTRAVENOUS

## 2011-11-06 MED ORDER — DEXTROSE IN LACTATED RINGERS 5 % IV SOLN
INTRAVENOUS | Status: DC
  Administered 2011-11-06 (×2): via INTRAVENOUS

## 2011-11-06 MED ORDER — MECLIZINE HCL 25 MG PO TABS
25.0000 mg | ORAL_TABLET | Freq: Every day | ORAL | Status: DC
  Administered 2011-11-07: 25 mg via ORAL
  Filled 2011-11-06: qty 1

## 2011-11-06 MED ORDER — ONDANSETRON HCL 4 MG/2ML IJ SOLN: 4.0000 mg | Freq: Four times a day (QID) | INTRAMUSCULAR | Status: DC | PRN

## 2011-11-06 MED ORDER — SUCCINYLCHOLINE CHLORIDE 20 MG/ML IJ SOLN
INTRAMUSCULAR | Status: DC | PRN
  Administered 2011-11-06: 120 mg via INTRAVENOUS

## 2011-11-06 MED ORDER — EPHEDRINE SULFATE 50 MG/ML IJ SOLN
INTRAMUSCULAR | Status: DC | PRN
  Administered 2011-11-06 (×5): 10 mg via INTRAVENOUS

## 2011-11-06 MED ORDER — GABAPENTIN 600 MG PO TABS
600.0000 mg | ORAL_TABLET | Freq: Three times a day (TID) | ORAL | Status: DC
  Administered 2011-11-06 – 2011-11-07 (×2): 600 mg via ORAL
  Filled 2011-11-06 (×6): qty 1

## 2011-11-06 MED ORDER — SODIUM CHLORIDE 0.9 % IR SOLN
Status: DC | PRN
  Administered 2011-11-06 (×2): 1000 mL

## 2011-11-06 MED ORDER — BUPIVACAINE-EPINEPHRINE (PF) 0.5% -1:200000 IJ SOLN
INTRAMUSCULAR | Status: AC
  Filled 2011-11-06: qty 10

## 2011-11-06 MED ORDER — HEPARIN SODIUM (PORCINE) 5000 UNIT/ML IJ SOLN
5000.0000 [IU] | Freq: Three times a day (TID) | INTRAMUSCULAR | Status: DC
  Administered 2011-11-06 – 2011-11-07 (×2): 5000 [IU] via SUBCUTANEOUS
  Filled 2011-11-06 (×7): qty 1

## 2011-11-06 MED ORDER — LIDOCAINE-EPINEPHRINE 0.5 %-1:200000 IJ SOLN
INTRAMUSCULAR | Status: DC | PRN
  Administered 2011-11-06 (×2): 50 mL

## 2011-11-06 MED ORDER — LIDOCAINE-EPINEPHRINE 0.5 %-1:200000 IJ SOLN
100.0000 mL | INTRAMUSCULAR | Status: DC
  Filled 2011-11-06: qty 100

## 2011-11-06 MED ORDER — BUPIVACAINE-EPINEPHRINE (PF) 0.5% -1:200000 IJ SOLN
INTRAMUSCULAR | Status: AC
  Filled 2011-11-06: qty 30

## 2011-11-06 SURGICAL SUPPLY — 56 items
ATCH SMKEVC FLXB CAUT HNDSWH (FILTER) ×2 IMPLANT
BAG DECANTER FOR FLEXI CONT (MISCELLANEOUS) ×2 IMPLANT
BENZOIN TINCTURE PRP APPL 2/3 (GAUZE/BANDAGES/DRESSINGS) ×8 IMPLANT
BINDER BREAST XXLRG (GAUZE/BANDAGES/DRESSINGS) ×4 IMPLANT
BLADE KNIFE  20 PERSONNA (BLADE) ×4
BLADE KNIFE 20 PERSONNA (BLADE) ×4 IMPLANT
BLADE KNIFE PERSONA 15 (BLADE) ×4 IMPLANT
BLADE SURG 10 STRL SS (BLADE) ×8 IMPLANT
BLADE SURG 21 STRL SS (BLADE) ×2 IMPLANT
CLOTH BEACON ORANGE TIMEOUT ST (SAFETY) ×2 IMPLANT
CLSR STERI-STRIP ANTIMIC 1/2X4 (GAUZE/BANDAGES/DRESSINGS) ×8 IMPLANT
COAGULATOR SUCT SWTCH 10FR 6 (ELECTROSURGICAL) IMPLANT
CONNECTOR 5 IN 1 STRAIGHT STRL (MISCELLANEOUS) ×2 IMPLANT
COVER SURGICAL LIGHT HANDLE (MISCELLANEOUS) ×2 IMPLANT
DRAIN CHANNEL 10F 3/8 F FF (DRAIN) IMPLANT
DRAPE LAPAROSCOPIC ABDOMINAL (DRAPES) ×2 IMPLANT
DRSG PAD ABDOMINAL 8X10 ST (GAUZE/BANDAGES/DRESSINGS) ×12 IMPLANT
ELECT CAUTERY BLADE 6.4 (BLADE) ×2 IMPLANT
ELECT REM PT RETURN 9FT ADLT (ELECTROSURGICAL) ×2
ELECTRODE REM PT RTRN 9FT ADLT (ELECTROSURGICAL) ×1 IMPLANT
EVACUATOR PREFILTER SMOKE (MISCELLANEOUS) ×2 IMPLANT
EVACUATOR SILICONE 100CC (DRAIN) ×4 IMPLANT
EVACUATOR SMOKE ACCUVAC VALLEY (FILTER) ×2
GAUZE XEROFORM 5X9 LF (GAUZE/BANDAGES/DRESSINGS) ×4 IMPLANT
GLOVE BIO SURGEON STRL SZ7 (GLOVE) ×2 IMPLANT
GLOVE BIOGEL PI IND STRL 7.0 (GLOVE) ×5 IMPLANT
GLOVE BIOGEL PI INDICATOR 7.0 (GLOVE) ×5
GLOVE ECLIPSE 6.5 STRL STRAW (GLOVE) ×2 IMPLANT
GLOVE ECLIPSE 7.0 STRL STRAW (GLOVE) ×4 IMPLANT
GLOVE SS BIOGEL STRL SZ 7 (GLOVE) ×1 IMPLANT
GLOVE SUPERSENSE BIOGEL SZ 7 (GLOVE) ×1
GOWN STRL NON-REIN LRG LVL3 (GOWN DISPOSABLE) ×10 IMPLANT
KIT BASIN OR (CUSTOM PROCEDURE TRAY) ×2 IMPLANT
KIT ROOM TURNOVER OR (KITS) ×2 IMPLANT
NEEDLE SPNL 18GX3.5 QUINCKE PK (NEEDLE) ×4 IMPLANT
NS IRRIG 1000ML POUR BTL (IV SOLUTION) ×2 IMPLANT
PACK GENERAL/GYN (CUSTOM PROCEDURE TRAY) ×2 IMPLANT
PAD ARMBOARD 7.5X6 YLW CONV (MISCELLANEOUS) ×2 IMPLANT
PEN SKIN MARKING BROAD (MISCELLANEOUS) ×4 IMPLANT
PIN SAFETY STERILE (MISCELLANEOUS) IMPLANT
PREFILTER EVAC NS 1 1/3-3/8IN (MISCELLANEOUS) ×2 IMPLANT
PREFILTER SMOKE EVAC (FILTER) ×2 IMPLANT
SPECIMEN JAR LARGE (MISCELLANEOUS) ×4 IMPLANT
SPONGE GAUZE 4X4 12PLY (GAUZE/BANDAGES/DRESSINGS) ×4 IMPLANT
SPONGE LAP 18X18 X RAY DECT (DISPOSABLE) ×6 IMPLANT
STRIP CLOSURE SKIN 1/2X4 (GAUZE/BANDAGES/DRESSINGS) ×8 IMPLANT
SUT MNCRL AB 3-0 PS2 18 (SUTURE) ×16 IMPLANT
SUT MON AB 5-0 PS2 18 (SUTURE) ×4 IMPLANT
SUT PROLENE 3 0 PS 1 (SUTURE) ×12 IMPLANT
SYR 50ML SLIP (SYRINGE) ×4 IMPLANT
TOWEL OR 17X24 6PK STRL BLUE (TOWEL DISPOSABLE) ×2 IMPLANT
TOWEL OR 17X26 10 PK STRL BLUE (TOWEL DISPOSABLE) ×4 IMPLANT
TOWEL OR NON WOVEN STRL DISP B (DISPOSABLE) ×2 IMPLANT
TUBE CONNECTING 12X1/4 (SUCTIONS) ×4 IMPLANT
TUBING 1/4  WITH 7/8  ADAPTER (MISCELLANEOUS) IMPLANT
WATER STERILE IRR 1000ML POUR (IV SOLUTION) IMPLANT

## 2011-11-06 NOTE — Preoperative (Signed)
Beta Blockers   Reason not to administer Beta Blockers:toprol xl 11/06/11 0400

## 2011-11-06 NOTE — Anesthesia Procedure Notes (Signed)
Procedure Name: Intubation Date/Time: 11/06/2011 8:06 AM Performed by: Leona Singleton A Pre-anesthesia Checklist: Patient identified Patient Re-evaluated:Patient Re-evaluated prior to inductionOxygen Delivery Method: Circle system utilized Preoxygenation: Pre-oxygenation with 100% oxygen Intubation Type: IV induction Ventilation: Mask ventilation without difficulty Laryngoscope Size: Miller and 2 Grade View: Grade II Tube type: Oral Tube size: 7.5 mm Number of attempts: 1 Airway Equipment and Method: Stylet and Patient positioned with wedge pillow Placement Confirmation: ETT inserted through vocal cords under direct vision,  positive ETCO2 and breath sounds checked- equal and bilateral Secured at: 21 cm Tube secured with: Tape Dental Injury: Teeth and Oropharynx as per pre-operative assessment  Comments: Pt noted to have large amount of supraglottic tissue

## 2011-11-06 NOTE — Transfer of Care (Signed)
Immediate Anesthesia Transfer of Care Note  Patient: [name] [name]  Procedure(s) Performed: Procedure(s) (LRB): MAMMARY REDUCTION  (BREAST) (Bilateral)  Patient Location: PACU  Anesthesia Type: General  Level of Consciousness: awake, alert , oriented and patient cooperative  Airway & Oxygen Therapy: Patient Spontanous Breathing and Patient connected to nasal cannula oxygen  Post-op Assessment: Report given to PACU RN, Post -op Vital signs reviewed and stable and Patient moving all extremities X 4  Post vital signs: Reviewed and stable  Complications: No apparent anesthesia complications

## 2011-11-06 NOTE — Brief Op Note (Signed)
11/06/2011  11:39 AM  PATIENT:  Laurene Footman Macphail  66 y.o. female  PRE-OPERATIVE DIAGNOSIS:  MASTOPEXIA  POST-OPERATIVE DIAGNOSIS:  MASTOPEXIA  PROCEDURE:  Procedure(s) (LRB): MAMMARY REDUCTION  (BREAST) (Bilateral)  SURGEON:  Surgeon(s) and Role:    * Louisa Second, MD - Primary  PHYSICIAN ASSISTANT:   ASSISTANTS: none   ANESTHESIA:   general  EBL:  Total I/O In: 2000 [I.V.:2000] Out: 200 [Blood:200]  BLOOD ADMINISTERED:none  DRAINS: (right and left lateral chest areas) Jackson-Pratt drain(s) with closed bulb suction in the    LOCAL MEDICATIONS USED:  LIDOCAINE   SPECIMEN:  Excision  DISPOSITION OF SPECIMEN:  PATHOLOGY  COUNTS:  YES  TOURNIQUET:  * No tourniquets in log *  DICTATION: .Other Dictation: Dictation Number [id number]  PLAN OF CARE: Admit for overnight observation  PATIENT DISPOSITION:  PACU - hemodynamically stable.   Delay start of Pharmacological VTE agent (>24hrs) due to surgical blood loss or risk of bleeding: yes

## 2011-11-06 NOTE — Anesthesia Postprocedure Evaluation (Signed)
  Anesthesia Post-op Note  Patient: [name] [name]  Procedure(s) Performed: Procedure(s) (LRB): MAMMARY REDUCTION  (BREAST) (Bilateral)  Patient Location: PACU  Anesthesia Type: General  Level of Consciousness: awake, oriented, sedated and patient cooperative  Airway and Oxygen Therapy: Patient Spontanous Breathing and Patient connected to nasal cannula oxygen  Post-op Pain: none  Post-op Assessment: Post-op Vital signs reviewed, Patient's Cardiovascular Status Stable, Respiratory Function Stable, Patent Airway, No signs of Nausea or vomiting and Pain level controlled  Post-op Vital Signs: stable  Complications: No apparent anesthesia complications

## 2011-11-06 NOTE — Progress Notes (Signed)
Pt was in NSR w/PVC's intra-op; has a hx of a.fib. Anesthesia aware that pt is in A.Fib now. No new orders. Instructed to send pt to 6 N as ordered.

## 2011-11-06 NOTE — Anesthesia Preprocedure Evaluation (Addendum)
Anesthesia Evaluation  Patient identified by MRN, date of birth, ID band Patient awake    Reviewed: Allergy & Precautions, H&P , NPO status , Patient's Chart, lab work & pertinent test results  History of Anesthesia Complications Negative for: history of anesthetic complications  Airway Mallampati: II  Neck ROM: Full    Dental  (+) Edentulous Upper and Dental Advisory Given   Pulmonary shortness of breath and with exertion, PE (coumadin therapy)   + decreased breath sounds      Cardiovascular hypertension, Pt. on medications and Pt. on home beta blockers +CHF and DVT + dysrhythmias Atrial Fibrillation + Valvular Problems/Murmurs Rhythm:Regular Rate:Normal  Study Conclusions  - Left ventricle: The cavity size was normal. There was   moderate concentric hypertrophy. Systolic function was   normal. The estimated ejection fraction was in the range   of 60% to 65%. Wall motion was normal; there were no   regional wall motion abnormalities. Features are   consistent with a pseudonormal left ventricular filling   pattern, with concomitant abnormal relaxation and   increased filling pressure (grade 2 diastolic   dysfunction). - Left atrium: The atrium was mildly dilated.    Neuro/Psych PSYCHIATRIC DISORDERS Anxiety Depression  Neuromuscular disease (peripheral neuropathy; sciatic pain RLE)    GI/Hepatic Neg liver ROS, GERD-  Medicated and Controlled,  Endo/Other  Poorly Controlled, Type 2, Insulin Dependent and Oral Hypoglycemic AgentsMorbid obesity  Renal/GU Renal InsufficiencyRenal disease (Cr 1.89)     Musculoskeletal  (+) Arthritis -, Osteoarthritis,    Abdominal (+) + obese,   Peds  Hematology  (+) Blood dyscrasia (last dose coumadin 11/01/11), ,   Anesthesia Other Findings   Reproductive/Obstetrics                         Anesthesia Physical Anesthesia Plan  ASA: III  Anesthesia Plan:  General   Post-op Pain Management:    Induction: Intravenous  Airway Management Planned: Oral ETT  Additional Equipment:   Intra-op Plan:   Post-operative Plan: Extubation in OR  Informed Consent: I have reviewed the patients History and Physical, chart, labs and discussed the procedure including the risks, benefits and alternatives for the proposed anesthesia with the patient or authorized representative who has indicated his/her understanding and acceptance.   Dental advisory given  Plan Discussed with: CRNA and Surgeon  Anesthesia Plan Comments:         Anesthesia Quick Evaluation

## 2011-11-07 ENCOUNTER — Encounter (HOSPITAL_COMMUNITY): Payer: Self-pay | Admitting: Specialist

## 2011-11-07 LAB — BASIC METABOLIC PANEL
BUN: 37 mg/dL — ABNORMAL HIGH (ref 6–23)
Chloride: 103 mEq/L (ref 96–112)
Creatinine, Ser: 2.27 mg/dL — ABNORMAL HIGH (ref 0.50–1.10)
GFR calc Af Amer: 25 mL/min — ABNORMAL LOW (ref >90)
GFR calc non Af Amer: 22 mL/min — ABNORMAL LOW (ref >90)
Potassium: 3.3 mEq/L — ABNORMAL LOW (ref 3.5–5.1)

## 2011-11-07 LAB — CBC
MCHC: 33.3 g/dL (ref 30.0–36.0)
Platelets: 245 10*3/uL (ref 150–400)
RDW: 15.8 % — ABNORMAL HIGH (ref 11.5–15.5)
WBC: 7 10*3/uL (ref 4.0–10.5)

## 2011-11-07 MED ORDER — WHITE PETROLATUM GEL
Status: AC
  Administered 2011-11-07: 07:00:00
  Filled 2011-11-07: qty 5

## 2011-11-07 NOTE — Progress Notes (Signed)
Notified Dr. Shon Hough that pt had not voided since surgery. Received new orders for I&O cath. Catheterized pt for 700 ml urine.

## 2011-11-07 NOTE — Progress Notes (Signed)
Notified Dr. Shon Hough of pt's inability to void since I&O cath, received orders to place foley catheter, placed foley catheter.

## 2011-11-07 NOTE — Op Note (Signed)
NAME:  Hetzer, Erum                  ACCOUNT NO.:  [id number]  MEDICAL RECORD NO.:  [id number]  LOCATION:  6N06C                        FACILITY:  [facility]  PHYSICIAN:  Earvin Hansen L. Damarrion Mimbs, M.D.DATE OF BIRTH:  1945/05/19  DATE OF PROCEDURE:  11/06/2011 DATE OF DISCHARGE:                              OPERATIVE REPORT   This is a 66 year old lady with severe, severe macromastia, back and shoulder pain secondary to large pendulous breasts, intertriginous changes, no relief from heat and cold packs.  She wore special bras in the past and no avail. She still has tremendous pain and discomfort.  PROCEDURES PLANNED:  Bilateral breast reductions using the inferior pedicle technique.  Excision of accessory breast tissue as well.  ANESTHESIA:  General.  DESCRIPTION OF PROCEDURE:  Preoperatively, the patient was sat up and drawn for the inferior pedicle reduction mammoplasty, remarking the nipple-areolar complex from over 40 cm up to 26 from the suprasternal notch.  She was then taken to the operating room and placed on operating room table in the supine position.  She was given adequate general anesthesia and intubated without difficulty.  Prep was done to the chest and breast areas in routine fashion using Hibiclens soap and solution, walled off with sterile towels and drapes so as to make a sterile field. Xylocaine 0.25% with epinephrine was injected locally, 1:1000 concentration, total of 200 mL per side.  This was allowed to set up. After this, the wounds were scored with a #15 blade.  The skin of the inferior pedicle was de-epithelialized with #20 blade.  Medial and lateral fatty dermal pedicles were incised down to underlying fascia, and laterally, more accessory breast tissue was removed.  The new keyhole was debulked that was trimmed appropriately.  After proper hemostasis and irrigation, the flaps were transposed and stayed with 3-0 Prolene suture.  Subcutaneous closure was done with  3-0 Monocryl x2 layers, then running subcuticular stitch of 3-0 Monocryl and 5-0 Monocryl throughout the inverted T.  The same procedure was carried out on both sides, removing over 1700 g on the left side and over 2100 g on the right.  The nipple-areolar complex were examined postoperatively with excellent blood supply.  Dressings were applied including Steri- Strips 0.5 inch, Xeroform, 4x4s, ABDs, Hypafix tape.  The patient underwent all procedures very well.  Estimated blood loss less than 200 mL.  Complications none.  She was then taken to recovery in excellent condition.  Hemodynamically stable throughout the case.     Yaakov Guthrie. Shon Hough, M.D.     Cathie Hoops  D:  11/06/2011  T:  11/07/2011  Job:  [id number]

## 2011-11-07 NOTE — Progress Notes (Signed)
Pt discharged to home with daughter. Discharge instructions explained and pt was instructed on how to empty and recharge her JP drains. Pt verbalized understanding. Will continue with discharge.

## 2012-01-24 ENCOUNTER — Ambulatory Visit (INDEPENDENT_AMBULATORY_CARE_PROVIDER_SITE_OTHER): Payer: Federal, State, Local not specified - PPO | Admitting: Infectious Disease

## 2012-01-24 ENCOUNTER — Encounter: Payer: Self-pay | Admitting: Infectious Disease

## 2012-01-24 VITALS — BP 147/80 | HR 61 | Temp 97.9°F | Wt 278.0 lb

## 2012-01-24 DIAGNOSIS — A4101 Sepsis due to Methicillin susceptible Staphylococcus aureus: Secondary | ICD-10-CM

## 2012-01-24 DIAGNOSIS — L739 Follicular disorder, unspecified: Secondary | ICD-10-CM

## 2012-01-24 DIAGNOSIS — B029 Zoster without complications: Secondary | ICD-10-CM

## 2012-01-24 DIAGNOSIS — L738 Other specified follicular disorders: Secondary | ICD-10-CM

## 2012-01-24 DIAGNOSIS — M009 Pyogenic arthritis, unspecified: Secondary | ICD-10-CM

## 2012-01-24 MED ORDER — MUPIROCIN CALCIUM 2 % EX CREA: TOPICAL_CREAM | Freq: Two times a day (BID) | CUTANEOUS | Status: DC

## 2012-01-24 MED ORDER — DOXYCYCLINE HYCLATE 100 MG PO TABS: 100.0000 mg | ORAL_TABLET | Freq: Two times a day (BID) | ORAL | Status: DC

## 2012-01-24 MED ORDER — CHLORHEXIDINE GLUCONATE 4 % EX LIQD: 60.0000 mL | Freq: Every day | CUTANEOUS | Status: DC

## 2012-01-24 NOTE — Patient Instructions (Addendum)
WE WILL CHECK A CULTURE AND LABS TODAY  I WOULD LIKE YOU TO TAKE DOXYCYCLINE FOR ONE MONTH  THEN WHEN THERE ARE NO LESIONS  TAKE NIGHTLY SHOWER WITH HIBICLES SOAP X 7 DAYS DO NOT APPLY DEODORANT OR MAKEUP FOR ONE HOUR AFTER THIS BATH  ALSO   APPLY MUPIROCIN INTRANASALLY TWICE DAILY FOR 7 DAYS

## 2012-01-24 NOTE — Progress Notes (Signed)
Subjective:    Patient ID: [name] [name], female    DOB: Jul 02, 1945, 66 y.o.   MRN: [id number]  HPI  66 yo with complicated PMHX who was found to have MSSA septic shoulder in November , sp washout by Dr. Ophelia Charter sp IV cefazolin. I had seen her since May of 2012 which point time is still concern that she had elevated sedimentation rate. She states today that her shoulders not hurting her much except when the weather changes she without is without fevers chills nausea or malaise.  That she came to clinic today with a specific concern that she was having outbreaks of varicella-zoster infection. She describes outbreaks of lesions occur on her right face cross the midline gone to her left face and also occur along her right leg. A closer examination these appear to be more consistent with outbreaks of folliculitis rather than varicella-zoster infection. I suspect the surgery given by infection with methicillin sensitive staph aureus versus methicillin-resistant staph aureus.    Review of Systems  Constitutional: Negative for fever, chills, diaphoresis, activity change, appetite change, fatigue and unexpected weight change.  HENT: Negative for congestion, sore throat, rhinorrhea, sneezing, trouble swallowing and sinus pressure.   Eyes: Negative for photophobia and visual disturbance.  Respiratory: Negative for cough, chest tightness, shortness of breath, wheezing and stridor.   Cardiovascular: Negative for chest pain, palpitations and leg swelling.  Gastrointestinal: Negative for nausea, vomiting, abdominal pain, diarrhea, constipation, blood in stool, abdominal distention and anal bleeding.  Genitourinary: Negative for dysuria, hematuria, flank pain and difficulty urinating.  Musculoskeletal: Positive for arthralgias. Negative for myalgias, back pain, joint swelling and gait problem.  Skin: Positive for rash. Negative for color change, pallor and wound.  Neurological: Negative for dizziness, tremors,  weakness and light-headedness.  Hematological: Negative for adenopathy. Does not bruise/bleed easily.  Psychiatric/Behavioral: Negative for behavioral problems, confusion, disturbed wake/sleep cycle, dysphoric mood, decreased concentration and agitation.       Objective:   Physical Exam  Constitutional: She is oriented to person, place, and time. She appears well-developed and well-nourished. No distress.  HENT:  Head: Normocephalic and atraumatic.    Mouth/Throat: Oropharynx is clear and moist. No oropharyngeal exudate.  Eyes: Conjunctivae normal and EOM are normal. No scleral icterus.  Neck: Normal range of motion. Neck supple.  Cardiovascular: Normal rate and regular rhythm.   Pulmonary/Chest: Effort normal and breath sounds normal. No respiratory distress.  Abdominal: She exhibits no distension.  Musculoskeletal: She exhibits no edema and no tenderness.       Arms: Neurological: She is alert and oriented to person, place, and time. She has normal reflexes. She exhibits normal muscle tone. Coordination normal.  Skin: Skin is warm and dry. Rash noted. She is not diaphoretic. No erythema. No pallor.     Psychiatric: She has a normal mood and affect. Her behavior is normal. Judgment and thought content normal.          Assessment & Plan:   Follicular rash: Likely due to MSSA vs MRSA. Check Nares culture for MSSA vs MRSA. Give her one month of 2; twice daily followed by Hibiclens bath nightly for 7 days and mupirocin intranasally twice daily for 7 days. Does live with her brother but denies a much intimate contact with them and at times he has any history of skin lesions.  Methicillin-resistant staph aureus: Again I believe is more likely driving these rashes rather than varicella-zoster.  Question of varicella-zoster: Again don't think this is was gone. I  will check her varicella-zoster antibodies see in fact if she has evidence of past infection she does not recall having  chickenpox as child.  History of septic shoulder: We'll check a sedimentation rate this seems to be clinically resolved.

## 2012-01-28 LAB — NASAL CULTURE (N/P)

## 2012-02-14 ENCOUNTER — Ambulatory Visit (INDEPENDENT_AMBULATORY_CARE_PROVIDER_SITE_OTHER): Payer: Federal, State, Local not specified - PPO | Admitting: Surgery

## 2012-02-14 ENCOUNTER — Encounter (INDEPENDENT_AMBULATORY_CARE_PROVIDER_SITE_OTHER): Payer: Self-pay | Admitting: Surgery

## 2012-02-14 DIAGNOSIS — Z713 Dietary counseling and surveillance: Secondary | ICD-10-CM

## 2012-02-14 DIAGNOSIS — Z1231 Encounter for screening mammogram for malignant neoplasm of breast: Secondary | ICD-10-CM

## 2012-02-14 DIAGNOSIS — Z6841 Body Mass Index (BMI) 40.0 and over, adult: Secondary | ICD-10-CM

## 2012-02-14 NOTE — Progress Notes (Signed)
Chief Complaint:  BMI 51 and DM  History of Present IllneASAPss:  [name] [name] is an 66 y.o. 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  She has been to our seminar and once to have a gastric bypass. I explained again the procedure with the small pouch and 1 m bypass of small bowel. She is followed by Dr. Nehemiah Settle and is referred for bariatric surgery. Informed consent form given to her. She is aware of the risks particularly since she has multiple comorbidities. I mentioned to her about having a Greenfield filter placed preoperatively. She seemed to understand that an old include this information in her instructions today. She would like to go ahead and get the workup underway so she could have the surgery "ASAP".    Past Medical History  Diagnosis Date  . Angina   . Heart murmur   . Shortness of breath   . Blood dyscrasia   . Mental disorder   . Hypertension   . CHF (congestive heart failure)   . 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 Blood transfusion   . GERD (gastroesophageal reflux disease)   . Arthritis   . Anxiety   . Diabetes mellitus   . Anemia   . Depression   . Chronic kidney disease, stage III (moderate)   . Pulmonary embolism     on lifelong coumadin  . RENAL INSUFFICIENCY, CHRONIC 03/19/2010  . Pneumonia     hx of bronchitis  . Chronic back pain   . Gout     hx of left foot  . Shingles   . Sciatic nerve pain     right leg  . Dysrhythmia     sees Dr.  Anne Fu    Past Surgical History  Procedure Date  . Vascular surgery     vein removed in legs  . Dilation and curettage of uterus   . Joint replacement     both knees  . Tubal ligation   . Shoulder surgery     right shoulder I& D secondary to septic joint  . Laser ablation     "for dysrhythmia's approx. 4 years ago"  . Mammary 11/06/2011  . Breast reduction surgery 11/06/2011    Procedure: MAMMARY REDUCTION  (BREAST);  Surgeon: Louisa Second, MD;   Location: [facility] OR;  Service: Plastics;  Laterality: Bilateral;    Current Outpatient Prescriptions  Medication Sig Dispense Refill  . B-D INS SYR ULTRAFINE 1CC/30G 30G X 1/2" 1 ML MISC       . chlorhexidine (HIBICLENS) 4 % external liquid Apply 60 mLs (4 application total) topically daily.  120 mL  3  . diltiazem (CARDIZEM CD) 120 MG 24 hr capsule Take 120 mg by mouth daily.       Marland Kitchen doxycycline (VIBRA-TABS) 100 MG tablet Take 1 tablet (100 mg total) by mouth 2 (two) times daily.  60 tablet  1  . febuxostat (ULORIC) 40 MG tablet Take 80 mg by mouth daily.      . fluconazole (DIFLUCAN) 150 MG tablet       . furosemide (LASIX) 80 MG tablet Take 80 mg by mouth 2 (two) times daily.        Marland Kitchen gabapentin (NEURONTIN) 600 MG tablet Take 600 mg by mouth 3 (three) times daily.      . insulin aspart (NOVOLOG) 100 UNIT/ML injection Inject 30 Units into the skin 2 (two) times daily.  Per sliding scale      . meclizine (ANTIVERT) 25 MG tablet Take 25 mg by mouth daily.       . metoprolol (TOPROL-XL) 100 MG 24 hr tablet Take 25 mg by mouth daily.       . mupirocin cream (BACTROBAN) 2 % Apply topically 2 (two) times daily.  15 g  0  . potassium chloride (K-DUR) 10 MEQ tablet Take 20 mEq by mouth 2 (two) times daily.       . promethazine (PHENERGAN) 25 MG tablet Take 25 mg by mouth every 4 (four) hours as needed. For nausea and vomiting      . sertraline (ZOLOFT) 50 MG tablet Take 50 mg by mouth daily.        . simvastatin (ZOCOR) 40 MG tablet       . warfarin (COUMADIN) 5 MG tablet daily.      Marland Kitchen zolpidem (AMBIEN) 10 MG tablet Take 10 mg by mouth at bedtime.       . insulin glargine (LANTUS) 100 UNIT/ML injection Inject 80 Units into the skin 2 (two) times daily.        Other and Shellfish allergy No family history on file. Social History:   reports that she has never smoked. She has never used smokeless tobacco. She reports that she does not drink alcohol or use illicit drugs.   REVIEW OF SYSTEMS -  PERTINENT POSITIVES ONLY: DVT  Physical Exam:   Blood pressure 158/80, pulse 76, temperature 97.4 F (36.3 C), temperature source Temporal, resp. rate 20, height 5' 3.5" (1.613 m), weight 297 lb (134.718 kg). Body mass index is 51.79 kg/(m^2).  Gen:  WDWN AAF NAD  Neurological: Alert and oriented to person, place, and time. Motor and sensory function is grossly intact  Head: Normocephalic and atraumatic.  Eyes: Conjunctivae are normal. Pupils are equal, round, and reactive to light. No scleral icterus.  Neck: Normal range of motion. Neck supple. No tracheal deviation or thyromegaly present.  Cardiovascular:  SR without murmurs or gallops.  No carotid bruits Respiratory: Effort normal.  No respiratory distress. No chest wall tenderness. Breath sounds normal.  No wheezes, rales or rhonchi.  Abdomen:  No incisions except for prior BTL GU: Musculoskeletal: Normal range of motion. Extremities are nontender. No cyanosis, edema or clubbing noted Lymphadenopathy: No cervical, preauricular, postauricular or axillary adenopathy is present Skin: Skin is warm and dry. No rash noted. No diaphoresis. No erythema. No pallor. Pscyh: Normal mood and affect. Behavior is normal. Judgment and thought content normal.   LABORATORY RESULTS: No results found for this or any previous visit (from the past 48 hour(s)).  RADIOLOGY RESULTS: No results found.  Problem List: Patient Active Problem List  Diagnosis  . OTITIS EXTERNA DUE TO HERPES ZOSTER  . DIABETES MELLITUS, TYPE II, ON INSULIN  . DIABETIC PERIPHERAL NEUROPATHY  . HYPERLIPIDEMIA  . Morbid obesity  . ANEMIA  . DEPRESSION  . HYPERTENSION  . PULMONARY EMBOLISM  . ATRIAL FLUTTER, CHRONIC  . GERD  . RENAL INSUFFICIENCY, CHRONIC  . PYOGENIC ARTHRITIS, SHOULDER REGION  . OSTEOARTHRITIS  . OSTEOMYELITIS  . INTERMITTENT VERTIGO  . HERPES ZOSTER  . CANDIDIASIS OF VULVA AND VAGINA  . Encounter for Losh-term (current) use of anticoagulants  .  Type II or unspecified type diabetes mellitus without mention of complication, uncontrolled    Assessment & Plan: Morbid obesity with DM and multiple comorbidities.  Will begin the bariatric journey.  This is include a Greenfield filter preop.  Matt B. Daphine Deutscher, MD, [facility] [phone] beeper [phone]  02/14/2012 11:12 AM

## 2012-02-14 NOTE — Patient Instructions (Addendum)
Inferior Vena Cava Filter Insertion Insertion of an inferior vena cava (IVC) filter is usually a safe procedure. It is a procedure using a filter designed to help prevent blood clots in the legs or pelvis from traveling to the lungs. A large blood clot in the lungs can cause death. The risks involved are usually small and easily managed. This device is only used when blood thinners (anticoagulants) cannot be used. Some of these reasons may be:  Severe platelet problems or shortage.  Recent or current major bleeding which cannot be treated.  Bleeding associated with anticoagulants.  Recurrence of blood clot while on anticoagulants.  The need for surgery in the near future.  Bleeding in the head. The filter is a small, metal device about an inch Lippe. It is shaped like the spokes of an umbrella. The filter is placed in the inferior vena cava. The inferior vena cava is the large vein which returns blood from the lower body to the heart.  Blood clots are sometimes treated with blood thinning drugs called anti-coagulants. Filters are used when blood thinners may not be enough. Your caregivers will discuss these issues with you. Together you can determine the best course of treatment to take. EXPECTATIONS OF A FILTER  An IVC filter will reduce the risk, not eliminate it and cannot prevent small PE's.  It does not stop deep vein clots from growing, recurring, or developing the postphlebitic syndrome. RISKS AND COMPLICATIONS  Vena cava filter insertion is a very safe procedure. Occasionally a small bruise forms around the needle insertion. A larger accumulation of blood called a hematoma may form. This is usually of no concern.  Continued bleeding or infections are uncommon.  Rarely damage is done to the vein by the catheter. This may require surgery or repair. There is a possibility that the filter can cause blockage of the vena cava. This can cause some swelling of the legs. There is a  possibility that the filter will eventually fail and not work properly. Despite some problems, the procedure is usually safe and carried out without them PROCEDURE   The procedure usually takes about one half to one hour but this can vary.  A specialist in reading x-rays (radiologist) usually performs this procedure.  It is usually performed in a special room in the x-ray department. It is often done in a hospital or same-day surgical center. You will be asked to put on a hospital gown.  Let your caregivers know of all allergies. This is very important if you have reacted to a dye given through the vein (intravenous) used for x-rays.  Do not eat or drink for four hours before the procedure or as instructed by your caregiver.  Sedatives are sometimes given to relieve anxiety.  The procedure is often done through the femoral vein (big vein in the groin). The skin around this area is usually shaved.  You will lie on the X-ray table, generally flat on your back. You need to have a needle put into a vein in your arm, so that the radiologist can give you medications (sedatives or painkillers).  An oxygen monitoring device may be attached to your finger. Oxygen may be given.  Everything is kept as sterile as possible during the procedure. The skin near the point of needle insertion will be cleaned with antiseptic and the area draped with sterile towels.  The skin and deeper tissues over the vein will be made numb with a local anesthetic. This is a medication like  Novocaine. You are awake during the procedure and can let your caregivers know if you have discomfort.  A needle is then put into the vein. A guide wire is placed through the needle and into the vein. This is used to help insert a catheter into your vein. The radiologist uses the X-ray equipment to make sure that the catheter and the wire are in the correct position. The wire is then withdrawn. The filter can then be released from the  catheter and left in place in the vena cava.  The catheter is then removed. Pressure will be kept on the needle insertion point for several minutes or until it is unlikely to bleed. SEEK IMMEDIATE MEDICAL CARE IF:   You develop swelling and discoloration or pain in the legs.  Your legs become pale and cold or blue.  You develop shortness of breath, feel faint or pass out.  You develop chest pain, cough, difficulty breathing or cough up blood.  You develop a rash or feel you are having problems which may be a side effect of medications given.  You develop weakness, difficulty moving your arms or legs or balance problems.  You develop problems with speech or vision. Document Released: 05/03/2005 Document Revised: 06/05/2011 Document Reviewed: 04/22/2007 [facility] Patient Information 2013 [place], [place].   Two weeks prior to surgery  Go on the extremely low carb liquid diet One week prior to surgery  No aspirin products.  Tylenol is acceptable  Stop smoking 24 hours prior to surgery  No alcoholic beverages  Report fever greater than 100.5 or excessive nasal drainage suggesting infection  Continue bariatric preop diet  Perform bowel prep if ordered  Do not eat or drink anything after midnight the night before surgery  Do not take any medications except those instructed by the anesthesiologist Morning of surgery  Please arrive at the hospital at least 2 hours before your scheduled surgery time.  No makeup, fingernail polish or jewelry  Bring insurance cards with you  Bring your CPAP mask if you use this

## 2012-02-15 ENCOUNTER — Telehealth: Payer: Self-pay | Admitting: Licensed Clinical Social Worker

## 2012-02-15 ENCOUNTER — Other Ambulatory Visit: Payer: Self-pay | Admitting: Infectious Disease

## 2012-02-15 LAB — COMPREHENSIVE METABOLIC PANEL
AST: 19 U/L (ref 0–37)
Albumin: 4.2 g/dL (ref 3.5–5.2)
BUN: 41 mg/dL — ABNORMAL HIGH (ref 6–23)
CO2: 27 mEq/L (ref 19–32)
Calcium: 8.7 mg/dL (ref 8.4–10.5)
Chloride: 104 mEq/L (ref 96–112)
Glucose, Bld: 322 mg/dL — ABNORMAL HIGH (ref 70–99)
Potassium: 3.9 mEq/L (ref 3.5–5.3)

## 2012-02-15 LAB — CBC WITH DIFFERENTIAL/PLATELET
Eosinophils Absolute: 0.1 10*3/uL (ref 0.0–0.7)
Eosinophils Relative: 2 % (ref 0–5)
HCT: 37.9 % (ref 36.0–46.0)
Lymphocytes Relative: 45 % (ref 12–46)
Lymphs Abs: 2.8 10*3/uL (ref 0.7–4.0)
MCH: 31.1 pg (ref 26.0–34.0)
MCV: 93.6 fL (ref 78.0–100.0)
Monocytes Absolute: 0.5 10*3/uL (ref 0.1–1.0)
Monocytes Relative: 8 % (ref 3–12)
Platelets: 286 10*3/uL (ref 150–400)
RBC: 4.05 MIL/uL (ref 3.87–5.11)
WBC: 6.3 10*3/uL (ref 4.0–10.5)

## 2012-02-15 LAB — LIPID PANEL
HDL: 43 mg/dL (ref >39)
LDL Cholesterol: 116 mg/dL — ABNORMAL HIGH (ref 0–99)

## 2012-02-15 LAB — TSH: TSH: 1.632 u[IU]/mL (ref 0.350–4.500)

## 2012-02-15 LAB — HEMOGLOBIN A1C: Hgb A1c MFr Bld: 9.8 % — ABNORMAL HIGH (ref <5.7)

## 2012-02-15 LAB — T4: T4, Total: 6.3 ug/dL (ref 5.0–12.5)

## 2012-02-15 NOTE — Telephone Encounter (Signed)
Patient walked in this morning stating that the antibiotic that she is taking for her facial rash is not helping. I looked at it today and it looks like dark spots around her lower cheek and chin. It does not look like a rash. She would like something else called in if possible.

## 2012-02-15 NOTE — Telephone Encounter (Signed)
treated her for possible folliculitis. If she has not responded to antibiotics then perhaps this is NOT a bacteria drive process and she would do well to take this up wit her primary care MD or a dermatologist.

## 2012-02-15 NOTE — Telephone Encounter (Signed)
Ok thanks 

## 2012-02-16 NOTE — Telephone Encounter (Signed)
Patient notified [name] [name]  

## 2012-02-26 ENCOUNTER — Ambulatory Visit (HOSPITAL_BASED_OUTPATIENT_CLINIC_OR_DEPARTMENT_OTHER): Payer: Federal, State, Local not specified - PPO

## 2012-02-26 ENCOUNTER — Telehealth (INDEPENDENT_AMBULATORY_CARE_PROVIDER_SITE_OTHER): Payer: Self-pay

## 2012-02-26 ENCOUNTER — Other Ambulatory Visit (INDEPENDENT_AMBULATORY_CARE_PROVIDER_SITE_OTHER): Payer: Self-pay | Admitting: Surgery

## 2012-02-26 ENCOUNTER — Telehealth (INDEPENDENT_AMBULATORY_CARE_PROVIDER_SITE_OTHER): Payer: Self-pay | Admitting: General Surgery

## 2012-02-26 NOTE — Telephone Encounter (Signed)
[name] [name], [facility] sleep center, called to let Dr. Daphine Deutscher know he needs to enter orders for a "split night sleep study" for this pt, secondary to her co-morbidities.  She will need to come there for the study to be done.  Please advise.

## 2012-02-26 NOTE — Telephone Encounter (Signed)
Sleep center called just to inform Dr Daphine Deutscher that due to patients H&P she will need to go to the center for the sleep study instead of picking up the device for home use.

## 2012-02-26 NOTE — Telephone Encounter (Signed)
Informed [name] [name] that we have put in those orders for the split night sleep study

## 2012-02-27 ENCOUNTER — Telehealth (INDEPENDENT_AMBULATORY_CARE_PROVIDER_SITE_OTHER): Payer: Self-pay | Admitting: General Surgery

## 2012-02-27 NOTE — Telephone Encounter (Signed)
Dr. Markus Daft called to let us know that this patient is someone who will need to receive orders, instructions, information orally instead of written.  He explained that he will send [place] his office notes from her evaluation in the next day or two.

## 2012-03-02 ENCOUNTER — Encounter: Payer: Self-pay | Admitting: *Deleted

## 2012-03-02 ENCOUNTER — Encounter: Payer: Federal, State, Local not specified - PPO | Attending: Surgery | Admitting: *Deleted

## 2012-03-02 DIAGNOSIS — Z713 Dietary counseling and surveillance: Secondary | ICD-10-CM | POA: Insufficient documentation

## 2012-03-02 DIAGNOSIS — Z01818 Encounter for other preprocedural examination: Secondary | ICD-10-CM | POA: Insufficient documentation

## 2012-03-02 NOTE — Progress Notes (Signed)
  Pre-Op Assessment Visit:  Pre-Operative Gastric Bypass Surgery  Medical Nutrition Therapy:  Appt start time: 1030   End time:  1130.  Patient was seen on 03/02/2012 for Pre-Operative RYGB Nutrition Assessment. Assessment and letter of approval faxed to [facility] B[facility]ric Surgery Program coordinator on 03/02/2012.  Approval letter sent to [facility] center and will be available in the chart under the media tab.  Handouts given during visit include:  Pre-Op Goals   Bariatric Surgery Protein Shakes  Samples given during visit include:   Premier Protein: 1 ea Lot # [id number];  Exp: 01/12/13   Unjury Protein: 1 pkt ea Lot # [id number], Exp: 02/15  Lot # [id number]; Exp: 02/15 Lot # [id number]; Exp: 03/15  Lot # [id number]; Exp: 10/14   Unjury Protein: 2 pkt Lot # [id number]; Exp: 01/15   Dahlia Bailiff Starter Kit: 1 ea Lot # [id number];  Exp: 07/14  VerioIQ Strips: 1 bottle/10 strips Lot # [id number]; Exp: 04/14  Patient to call for Pre-Op and Post-Op Nutrition Education at [facility] when surgery is scheduled.

## 2012-03-02 NOTE — Patient Instructions (Addendum)
   Follow Pre-Op Nutrition Goals to prepare for Gastric Bypass Surgery.   Call the [facility] at [phone] once you have been given your surgery date to enrolled in the Pre-Op Nutrition Class. You will need to attend this nutrition class 3-4 weeks prior to your surgery. 

## 2012-03-06 ENCOUNTER — Encounter (HOSPITAL_BASED_OUTPATIENT_CLINIC_OR_DEPARTMENT_OTHER): Payer: Federal, State, Local not specified - PPO

## 2012-03-06 ENCOUNTER — Ambulatory Visit (HOSPITAL_BASED_OUTPATIENT_CLINIC_OR_DEPARTMENT_OTHER): Payer: Federal, State, Local not specified - PPO | Attending: Surgery | Admitting: Radiology

## 2012-03-06 DIAGNOSIS — G4733 Obstructive sleep apnea (adult) (pediatric): Secondary | ICD-10-CM | POA: Insufficient documentation

## 2012-03-07 ENCOUNTER — Ambulatory Visit (HOSPITAL_COMMUNITY)
Admission: RE | Admit: 2012-03-07 | Discharge: 2012-03-07 | Disposition: A | Discharge status: Home / Self Care | Payer: Federal, State, Local not specified - PPO | Source: Ambulatory Visit | Attending: Surgery | Admitting: Surgery

## 2012-03-07 ENCOUNTER — Other Ambulatory Visit: Payer: Self-pay

## 2012-03-07 DIAGNOSIS — K219 Gastro-esophageal reflux disease without esophagitis: Secondary | ICD-10-CM | POA: Insufficient documentation

## 2012-03-07 DIAGNOSIS — Z1231 Encounter for screening mammogram for malignant neoplasm of breast: Secondary | ICD-10-CM | POA: Insufficient documentation

## 2012-03-07 DIAGNOSIS — I10 Essential (primary) hypertension: Secondary | ICD-10-CM | POA: Insufficient documentation

## 2012-03-07 DIAGNOSIS — K7689 Other specified diseases of liver: Secondary | ICD-10-CM | POA: Insufficient documentation

## 2012-03-07 DIAGNOSIS — K8020 Calculus of gallbladder without cholecystitis without obstruction: Secondary | ICD-10-CM | POA: Insufficient documentation

## 2012-03-07 DIAGNOSIS — I509 Heart failure, unspecified: Secondary | ICD-10-CM | POA: Insufficient documentation

## 2012-03-07 DIAGNOSIS — I209 Angina pectoris, unspecified: Secondary | ICD-10-CM | POA: Insufficient documentation

## 2012-03-07 DIAGNOSIS — I517 Cardiomegaly: Secondary | ICD-10-CM | POA: Insufficient documentation

## 2012-03-07 DIAGNOSIS — K228 Other specified diseases of esophagus: Secondary | ICD-10-CM | POA: Insufficient documentation

## 2012-03-07 DIAGNOSIS — K2289 Other specified disease of esophagus: Secondary | ICD-10-CM | POA: Insufficient documentation

## 2012-03-07 DIAGNOSIS — Z713 Dietary counseling and surveillance: Secondary | ICD-10-CM

## 2012-03-07 DIAGNOSIS — M109 Gout, unspecified: Secondary | ICD-10-CM | POA: Insufficient documentation

## 2012-03-07 DIAGNOSIS — Z86711 Personal history of pulmonary embolism: Secondary | ICD-10-CM | POA: Insufficient documentation

## 2012-03-07 DIAGNOSIS — Z6841 Body Mass Index (BMI) 40.0 and over, adult: Secondary | ICD-10-CM | POA: Insufficient documentation

## 2012-03-07 DIAGNOSIS — E119 Type 2 diabetes mellitus without complications: Secondary | ICD-10-CM | POA: Insufficient documentation

## 2012-03-07 DIAGNOSIS — M129 Arthropathy, unspecified: Secondary | ICD-10-CM | POA: Insufficient documentation

## 2012-03-08 ENCOUNTER — Encounter (HOSPITAL_COMMUNITY)
Admission: RE | Disposition: A | Discharge status: Home / Self Care | Payer: Self-pay | Source: Ambulatory Visit | Attending: Surgery

## 2012-03-08 ENCOUNTER — Ambulatory Visit (HOSPITAL_COMMUNITY)
Admission: RE | Admit: 2012-03-08 | Discharge: 2012-03-08 | Disposition: A | Discharge status: Home / Self Care | Payer: Federal, State, Local not specified - PPO | Source: Ambulatory Visit | Attending: Surgery | Admitting: Surgery

## 2012-03-08 DIAGNOSIS — E669 Obesity, unspecified: Secondary | ICD-10-CM | POA: Insufficient documentation

## 2012-03-08 HISTORY — PX: BREATH TEK H PYLORI: SHX5422

## 2012-03-08 SURGERY — BREATH TEST, FOR HELICOBACTER PYLORI

## 2012-03-09 DIAGNOSIS — R0609 Other forms of dyspnea: Secondary | ICD-10-CM

## 2012-03-09 DIAGNOSIS — R0989 Other specified symptoms and signs involving the circulatory and respiratory systems: Secondary | ICD-10-CM

## 2012-03-09 DIAGNOSIS — G4733 Obstructive sleep apnea (adult) (pediatric): Secondary | ICD-10-CM

## 2012-03-09 NOTE — Procedures (Signed)
NAME:  [name] [name], [name] [name]                  ACCOUNT NO.:  [id number]  MEDICAL RECORD NO.:  [id number]          PATIENT TYPE:  OUT  LOCATION:  SLEEP CENTER                 FACILITY:  [facility]  PHYSICIAN:  [name] Rademaker D. Maple Hudson, MD, FCCP, FACPDATE OF BIRTH:  1946-01-10  DATE OF STUDY:  03/06/2012                           NOCTURNAL POLYSOMNOGRAM  REFERRING PHYSICIAN:  Thornton Park. Daphine Deutscher, MD  INDICATION FOR STUDY:  Hypersomnia with sleep apnea.  EPWORTH SLEEPINESS SCORE:  3/24.  BMI 52.1, weight 294 pounds, height 63 inches, neck 15.5 inches.  MEDICATIONS:  Home medications are charted and reviewed.  SLEEP ARCHITECTURE:  Split study protocol.  During the diagnostic phase, total sleep time 120 minutes with sleep efficiency 93%.  Stage I was 10.8%, stage II 82.1%, stage III 6.7%, REM 0.4% of total sleep time. Sleep latency 0, REM latency 62 minutes, awake after sleep onset 9 minutes.  Arousal index of 20.  BEDTIME MEDICATION:  Ambien 10 mg.  RESPIRATORY DATA:  Split study protocol.  Apnea/hypopnea index (AHI) 48 per hour.  A total of 96 events was scored including 31 obstructive apneas, 2 central apneas, 63 hypopneas.  Events were associated with supine sleep position.  REM AHI 0.  CPAP was titrated to 16 CWP, AHI 1.5 per hour.  She wore a medium ResMed Mirage Quattro full-face mask with heated humidifier and an EPR of 3.  OXYGEN DATA:  Before CPAP, snoring was very loud with oxygen desaturation to a nadir of 78% on room air.  With CPAP control, snoring was prevented and mean oxygen saturation held 96.9% on room air.  CARDIAC DATA:  Normal sinus rhythm.  MOVEMENT-PARASOMNIA:  No significant movement disturbance.  No bathroom trips.  IMPRESSIONS-RECOMMENDATIONS: 1. Severe obstructive sleep apnea/hypopnea syndrome, AHI of 48 per     hour with supine events.  Very loud snoring with oxygen     desaturation to a nadir of 78% on room air. 2. Successful CPAP titration to 16 CWP, AHI 1.5 per hour.   She wore a     medium ResMed Mirage Quattro full-     face mask with heated humidifier and an EPR of 3.  Snoring was     prevented and mean oxygen saturation held 96.9% on CPAP.     Seraphine Gudiel D. Maple Hudson, MD, [facility], FACP Diplomate, American Board of Sleep Medicine    CDY/MEDQ  D:  03/09/2012 12:21:04  T:  03/09/2012 21:47:26  Job:  [id number]

## 2012-03-11 ENCOUNTER — Telehealth: Payer: Self-pay | Admitting: *Deleted

## 2012-03-11 ENCOUNTER — Encounter (HOSPITAL_COMMUNITY): Payer: Self-pay | Admitting: Surgery

## 2012-03-11 NOTE — Telephone Encounter (Signed)
Pt called inquiring as to total payment for surgery. Had her contact Leandrew Koyanagi at [facility] as we do not know that here.

## 2012-03-12 ENCOUNTER — Encounter (INDEPENDENT_AMBULATORY_CARE_PROVIDER_SITE_OTHER): Payer: Self-pay

## 2012-03-13 ENCOUNTER — Telehealth (INDEPENDENT_AMBULATORY_CARE_PROVIDER_SITE_OTHER): Payer: Self-pay | Admitting: Surgery

## 2012-03-13 NOTE — Telephone Encounter (Signed)
°  03/11/12 - patient left 3 voicemails (11:49am, 1:24 pm, 1:26pm) - stating she needs to know how much her OOP will be and can she get a surgery date?  I called the patient back after I returned from lunch. Adv her we have to get approval from the insurance and her estimated deposit amount to [facility] has to be paid. Advised her to call the hospital to find out what their amount would be Janae Sauce).  Cef  03/12/12 - patient stopped by the office with a second insurance card. It was an [employer] supplement and is only good for inpatient hospital charges. Advised the patient again of the amount that [facility] will collect and that we need to obtain prior approval from her insurance before she will be scheduled. She will contact Janae Sauce @ [place].   Cef 03/12/12 - patient left 3 voicemails (3:07pm, 3:52pm, 4:39pm) - states she spoke to [employer] @ [facility]. She has a large bad debt so she needs to have surgery in 2013 because her insurance will cover at 100% since she has met her deductible, etc. Also demanded we find another surgeon to do the surgery in 2013 since Dr. Daphine Deutscher was out of OR time. Cef  03/13/12 - patient left 2 voicemails (11:27am, 11:57am) - call her/ She can have surgery in 2014 per Brenda/she needs to know TODAY what her out of pocket is to [facility] cef  03/13/12 - 12:30pm - spoke with patient via phone. Advised her that her out of pocket/deposit amount to [facility] is the amount I gave her in writing 12/17 and via phone 12/16. She asked what she would have to pay the hospital. I advised the patient that that amount would be what Janae Sauce told her.  I also advised the patient I still need her last supervised weight loss visit note from Dr Nehemiah Settle before I could get prior approval from [facility]. She asked me to give her a surgery date. I advised her we would not schedule her surgery until we had approval from [facility] and her deposit amount had been paid. I advised the patient I will contact Dr.  Lynnette Caffey office to obtain notes and send to [facility] for approval. She will call or stop by to make her paymentcef

## 2012-03-21 ENCOUNTER — Telehealth: Payer: Self-pay | Admitting: *Deleted

## 2012-03-21 NOTE — Telephone Encounter (Signed)
Returned pt call regarding Pre-Op class.  Reports a surgery date of 04/15/12. Will register for class on 03/28/12 and send paperwork in the mail. Also had questions regarding hypoglycemia while following pre-op diet. Advised to contact PCP to alert she will be starting diet on 04/01/12 and ask for plan to handle any hypoglycemic episodes.

## 2012-03-22 ENCOUNTER — Telehealth (INDEPENDENT_AMBULATORY_CARE_PROVIDER_SITE_OTHER): Payer: Self-pay | Admitting: Surgery

## 2012-03-22 NOTE — Telephone Encounter (Signed)
°  Note: [facility] open 12/24 until noon and closed all day 12/25 for the Christmas holiday 03/19/12 - patient left 2 voicemails. (Our office was only open until 12 noon for the holiday). Stated she paid her deposit and wants to make sure her surgery date is 04/15/12.  Cef  03-21-12 - 3:27 pm - pt left voicemail on 8204 line stating she has been calling for 3 days. Is her surgery still on 04/15/12? She needs to know TODAY. Message slip given to me by Erin Sons. Cef 03-21-12 - 3:41pm - patient called and spoke to [place]. Elane Fritz asked me what to tell her. I advised Elane Fritz to let the patient know that we will NOT schedule her surgery until we have authorization from [employer] Harvey request faxed to [facility] on 03/19/12). I have not received the patients deposit either. Elane Fritz advised the patient that I will call her to schedule her surgery after approval is received.   (After further investigation, we found that the patient paid her deposit to [facility] @ the front desk) 03-21-12 - 3:42 pm - patient left voicemail on 8201 line stating she has been calling for 3 days and Okey Regal better call her back. She paid her deposit and Okey Regal told her that surgery was on 04/15/12 and now tha t has changed and she doesnt know what is going on. (I had told the patient that the first date we had available as of 12/17 was 04/15/12 but I did not tell the patient that WAS her surgery date.) Patient wants to know what her surgery date is because she told all of her doctors that surgery was on 04/15/12. Cef 03/22/12 - 9:24 am - called patient. Advised her that I received her approval for surgery from [facility] and that I have scheduled her surgery on 04/15/12 as that date was still available. I also conveyed the patients pre-op and post-op appointments to the patient verbally. I also mailed the surgery information and appointment information to the patient. cef

## 2012-03-26 ENCOUNTER — Other Ambulatory Visit (INDEPENDENT_AMBULATORY_CARE_PROVIDER_SITE_OTHER): Payer: Self-pay | Admitting: Surgery

## 2012-03-26 DIAGNOSIS — R928 Other abnormal and inconclusive findings on diagnostic imaging of breast: Secondary | ICD-10-CM

## 2012-03-28 ENCOUNTER — Encounter: Payer: Federal, State, Local not specified - PPO | Attending: Surgery | Admitting: *Deleted

## 2012-03-28 DIAGNOSIS — Z01818 Encounter for other preprocedural examination: Secondary | ICD-10-CM | POA: Insufficient documentation

## 2012-03-28 DIAGNOSIS — Z713 Dietary counseling and surveillance: Secondary | ICD-10-CM | POA: Insufficient documentation

## 2012-03-29 ENCOUNTER — Encounter: Payer: Self-pay | Admitting: *Deleted

## 2012-03-29 NOTE — Progress Notes (Signed)
Bariatric Class:  Appt start time: 1730 end time:  1830.  Pre-Operative Nutrition Class  Patient was seen on 03/28/12 for Pre-Operative Bariatric Surgery [facility].   Surgery date: 04/15/12 Surgery type: RYGB Start weight at [facility]: 291.6 lb (03/02/12) Goal weight:  180 lbs  Weight today: 293.1 lbs BMI: 51.1  Samples given per MNT protocol: Bariatric Advantage Multivitamin Lot # [id number]; Exp: 06/15  Celebrate Vitamins Multivitamin Lot # [id number]; Exp: 07/14  Celebrate Vitamins Calcium Citrate Lot # [id number]; Exp: 03/15  Opurity Sublingual B12 Lot # [id number]; Exp: 09/15  Opurity Bypass/Sleeve-Optimized MVI Lot # [id number]; Exp: 06/14  Corliss Marcus Protein powder Lot # [id number]; Exp: 02/15  Premier Protein shake Lot # [id number]; Exp: 02/01/13  The following the learning objective met by the patient during this course:  Identifies Pre-Op Dietary Goals and will begin 2 weeks pre-operatively  Identifies appropriate sources of fluids and proteins   States protein recommendations and appropriate sources pre and post-operatively  Identifies Post-Operative Dietary Goals and will follow for 2 weeks post-operatively  Identifies appropriate multivitamin and calcium sources  Describes the need for physical activity post-operatively and will follow MD recommendations  States when to call healthcare provider regarding medication questions or post-operative complications  Handouts given during class include:  Pre-Op Bariatric Surgery Diet Handout  Protein Shake Handout  Post-Op Bariatric Surgery Nutrition Handout  BELT Program Information Flyer  Support Group Information Flyer  [facility] Bariatric Supplements Price List  Follow-Up Plan: Patient will follow-up at [facility] 2 weeks post operatively for diet advancement per MD.

## 2012-03-29 NOTE — Patient Instructions (Signed)
Follow:   Pre-Op Diet per MD 2 weeks prior to surgery  Phase 2- Liquids (clear/full) 2 weeks after surgery  Vitamin/Mineral/Calcium guidelines for purchasing bariatric supplements  Exercise guidelines pre and post-op per MD  Follow-up at [facility] in 2 weeks post-op for diet advancement. Contact Parisa Pinela as needed with questions/concerns. 

## 2012-04-01 ENCOUNTER — Ambulatory Visit
Admission: RE | Admit: 2012-04-01 | Discharge: 2012-04-01 | Disposition: A | Discharge status: Home / Self Care | Payer: Federal, State, Local not specified - PPO | Source: Ambulatory Visit | Attending: Surgery | Admitting: Surgery

## 2012-04-01 DIAGNOSIS — R928 Other abnormal and inconclusive findings on diagnostic imaging of breast: Secondary | ICD-10-CM

## 2012-04-05 ENCOUNTER — Encounter (INDEPENDENT_AMBULATORY_CARE_PROVIDER_SITE_OTHER): Payer: Self-pay | Admitting: Surgery

## 2012-04-05 ENCOUNTER — Ambulatory Visit (INDEPENDENT_AMBULATORY_CARE_PROVIDER_SITE_OTHER): Payer: Federal, State, Local not specified - PPO | Admitting: Surgery

## 2012-04-05 ENCOUNTER — Encounter (HOSPITAL_COMMUNITY): Payer: Self-pay | Admitting: Pharmacy Technician

## 2012-04-05 DIAGNOSIS — Z6841 Body Mass Index (BMI) 40.0 and over, adult: Secondary | ICD-10-CM

## 2012-04-05 NOTE — Patient Instructions (Signed)

## 2012-04-05 NOTE — Progress Notes (Signed)
Chief Complaint:  Morbid obesity BMI 51  History of Present Illness:  [name] [name] is an 67 y.o. female that I initially saw in November to discuss Roux-en-Y gastric bypass. She is followed by Dr. Nehemiah Settle.  Informed consent has been obtained in the son forms are on the chart. In addition I talked to her today about Greenfield filter placement and her risk of a pulmonary  Embolus.I explained the reasons for this but she declined to have a Greenfield filter. She said that she didn't have one for her knee replacement or for her breast surgery. I recommended but she declined, and I will proceed with those risk being fully discussed with her.  I have urged her to stay on the preop diet and informed her that if her liver is too big we might not be able to do her surgery. She is aware of that.  Her preoperative upper GI showed some presbyesophagus and mild reflux. Ultrasound showed no gallstones.  Plan to proceed with the laparoscopic Roux-en-Y gastric bypass on January 20.  Past Medical History  Diagnosis Date  . Angina   . Heart murmur   . Shortness of breath   . Blood dyscrasia   . Mental disorder   . Hypertension   . CHF (congestive heart failure)   . 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 Blood transfusion   . GERD (gastroesophageal reflux disease)   . Arthritis   . Anxiety   . Diabetes mellitus   . Anemia   . Depression   . Chronic kidney disease, stage III (moderate)   . Pulmonary embolism     on lifelong coumadin  . RENAL INSUFFICIENCY, CHRONIC 03/19/2010  . Pneumonia     hx of bronchitis  . Chronic back pain   . Gout     hx of left foot  . Shingles   . Sciatic nerve pain     right leg  . Dysrhythmia     sees Dr.  Anne Fu    Past Surgical History  Procedure Date  . Vascular surgery     vein removed in legs  . Dilation and curettage of uterus   . Joint replacement     both knees  . Tubal ligation   . Shoulder surgery     right shoulder I& D secondary to septic  joint  . Laser ablation     "for dysrhythmia's approx. 4 years ago"  . Mammary 11/06/2011  . Breast reduction surgery 11/06/2011    Procedure: MAMMARY REDUCTION  (BREAST);  Surgeon: Louisa Second, MD;  Location: [facility] OR;  Service: Plastics;  Laterality: Bilateral;  . Breath tek h pylori 03/08/2012    Procedure: BREATH TEK H PYLORI;  Surgeon: Valarie Merino, MD;  Location: [facility];  Service: General;  Laterality: N/A;    Current Outpatient Prescriptions  Medication Sig Dispense Refill  . acetaminophen (TYLENOL) 500 MG tablet Take 2,000 mg by mouth every 6 (six) hours as needed. Pain      . B-D INS SYR ULTRAFINE 1CC/30G 30G X 1/2" 1 ML MISC       . chlorhexidine (HIBICLENS) 4 % external liquid Apply 60 mLs (4 application total) topically daily.  120 mL  3  . diltiazem (CARDIZEM CD) 120 MG 24 hr capsule Take 120 mg by mouth every morning.       . diltiazem (TIAZAC) 120 MG 24 hr capsule       . febuxostat (ULORIC) 40 MG  tablet Take 80 mg by mouth every morning.       . furosemide (LASIX) 80 MG tablet Take 80 mg by mouth 2 (two) times daily.       Marland Kitchen gabapentin (NEURONTIN) 600 MG tablet Take 600 mg by mouth 2 (two) times daily.       . insulin aspart (NOVOLOG) 100 UNIT/ML injection Inject 30 Units into the skin 2 (two) times daily. Per sliding scale.      . insulin glargine (LANTUS) 100 UNIT/ML injection Inject 80 Units into the skin 2 (two) times daily.      . meclizine (ANTIVERT) 25 MG tablet Take 25 mg by mouth 3 (three) times daily as needed. vertigo      . metoprolol (TOPROL-XL) 100 MG 24 hr tablet Take 100 mg by mouth every morning.       . mupirocin cream (BACTROBAN) 2 % Apply 1 application topically 2 (two) times daily.      Marland Kitchen oxyCODONE-acetaminophen (PERCOCET) 10-650 MG per tablet Take 1 tablet by mouth every 6 (six) hours as needed. Pain      . pantoprazole (PROTONIX) 40 MG tablet Take 40 mg by mouth daily.      . potassium chloride (K-DUR) 10 MEQ tablet Take 20 mEq by mouth 2  (two) times daily.       . promethazine (PHENERGAN) 25 MG tablet Take 25 mg by mouth every 4 (four) hours as needed. For nausea and vomiting      . sertraline (ZOLOFT) 50 MG tablet Take 50 mg by mouth every morning.       . simvastatin (ZOCOR) 40 MG tablet Take 40 mg by mouth at bedtime.       Marland Kitchen warfarin (COUMADIN) 5 MG tablet Take 5 mg by mouth daily.       Marland Kitchen zolpidem (AMBIEN) 10 MG tablet Take 10 mg by mouth at bedtime.        Other and Shellfish allergy History reviewed. No pertinent family history. Social History:   reports that she has never smoked. She has never used smokeless tobacco. She reports that she does not drink alcohol or use illicit drugs.   REVIEW OF SYSTEMS - PERTINENT POSITIVES ONLY: History of DVT  Physical Exam:   Blood pressure 144/70, pulse 72, temperature 98.4 F (36.9 C), temperature source Oral, resp. rate 16, height 5' 3.5" (1.613 m), weight 297 lb (134.718 kg). Body mass index is 51.79 kg/(m^2).  Gen:  WDWN AAF NAD  Neurological: Alert and oriented to person, place, and time. Motor and sensory function is grossly intact  Head: Normocephalic and atraumatic.  Eyes: Conjunctivae are normal. Pupils are equal, round, and reactive to light. No scleral icterus.  Neck: Normal range of motion. Neck supple. No tracheal deviation or thyromegaly present.  Cardiovascular:  SR without murmurs or gallops.  No carotid bruits Respiratory: Effort normal.  No respiratory distress. No chest wall tenderness. Breath sounds normal.  No wheezes, rales or rhonchi.  Abdomen:  Obese and no upper abdominal scars GU: Musculoskeletal: Normal range of motion. Extremities are nontender. No cyanosis, edema or clubbing noted Lymphadenopathy: No cervical, preauricular, postauricular or axillary adenopathy is present Skin: Skin is warm and dry. No rash noted. No diaphoresis. No erythema. No pallor. Pscyh: Normal mood and affect. Behavior is normal. Judgment and thought content normal.    LABORATORY RESULTS: No results found for this or any previous visit (from the past 48 hour(s)).  RADIOLOGY RESULTS: No results found.  Problem List: Patient  Active Problem List  Diagnosis  . OTITIS EXTERNA DUE TO HERPES ZOSTER  . DIABETES MELLITUS, TYPE II, ON INSULIN  . DIABETIC PERIPHERAL NEUROPATHY  . HYPERLIPIDEMIA  . Morbid obesity  . ANEMIA  . DEPRESSION  . HYPERTENSION  . PULMONARY EMBOLISM  . ATRIAL FLUTTER, CHRONIC  . GERD  . RENAL INSUFFICIENCY, CHRONIC  . PYOGENIC ARTHRITIS, SHOULDER REGION  . OSTEOARTHRITIS  . OSTEOMYELITIS  . INTERMITTENT VERTIGO  . HERPES ZOSTER  . CANDIDIASIS OF VULVA AND VAGINA  . Encounter for [place]-term (current) use of anticoagulants  . Type II or unspecified type diabetes mellitus without mention of complication, uncontrolled    Assessment & Plan: Morbid obesity for lap roux y gastric bypass Orders in EPIC    Matt B. Daphine Deutscher, MD, [facility] [phone] beeper [phone]  04/05/2012 4:20 PM

## 2012-04-05 NOTE — Progress Notes (Signed)
Dr. Daphine Deutscher,           Darla Friesenhahn is for surgery 1/20  --she is coming to [facility]  Tuesday 1/14 for her preop visit.  Please enter her preop orders into Epic.                    Thanks.

## 2012-04-09 ENCOUNTER — Other Ambulatory Visit (INDEPENDENT_AMBULATORY_CARE_PROVIDER_SITE_OTHER): Payer: Self-pay | Admitting: Surgery

## 2012-04-09 ENCOUNTER — Encounter (HOSPITAL_COMMUNITY)
Admission: RE | Admit: 2012-04-09 | Discharge: 2012-04-09 | Disposition: A | Discharge status: Home / Self Care | Payer: Federal, State, Local not specified - PPO | Source: Ambulatory Visit | Attending: Surgery | Admitting: Surgery

## 2012-04-09 ENCOUNTER — Encounter (HOSPITAL_COMMUNITY): Payer: Self-pay

## 2012-04-09 LAB — COMPREHENSIVE METABOLIC PANEL
AST: 31 U/L (ref 0–37)
Albumin: 3.5 g/dL (ref 3.5–5.2)
BUN: 32 mg/dL — ABNORMAL HIGH (ref 6–23)
Chloride: 98 mEq/L (ref 96–112)
Creatinine, Ser: 1.74 mg/dL — ABNORMAL HIGH (ref 0.50–1.10)
Potassium: 3.6 mEq/L (ref 3.5–5.1)
Total Bilirubin: 0.6 mg/dL (ref 0.3–1.2)
Total Protein: 7.6 g/dL (ref 6.0–8.3)

## 2012-04-09 LAB — CBC WITH DIFFERENTIAL/PLATELET
Basophils Absolute: 0 10*3/uL (ref 0.0–0.1)
Basophils Relative: 1 % (ref 0–1)
Eosinophils Absolute: 0.1 10*3/uL (ref 0.0–0.7)
MCH: 30.7 pg (ref 26.0–34.0)
MCHC: 32.8 g/dL (ref 30.0–36.0)
Monocytes Absolute: 0.5 10*3/uL (ref 0.1–1.0)
Neutro Abs: 2.9 10*3/uL (ref 1.7–7.7)
Neutrophils Relative %: 52 % (ref 43–77)
RDW: 15.4 % (ref 11.5–15.5)

## 2012-04-09 LAB — SURGICAL PCR SCREEN
MRSA, PCR: NEGATIVE
Staphylococcus aureus: NEGATIVE

## 2012-04-09 LAB — PROTIME-INR
INR: 1.57 — ABNORMAL HIGH (ref 0.00–1.49)
Prothrombin Time: 18.3 seconds — ABNORMAL HIGH (ref 11.6–15.2)

## 2012-04-09 LAB — APTT: aPTT: 32 seconds (ref 24–37)

## 2012-04-09 NOTE — Patient Instructions (Addendum)
[name] [name]  04/09/2012                           YOUR PROCEDURE IS SCHEDULED ON:  04/15/12               PLEASE REPORT TO SHORT STAY CENTER AT :  9:00 AM               CALL THIS NUMBER IF ANY PROBLEMS THE DAY OF SURGERY :               [phone]                      REMEMBER:   Do not eat food or drink liquids AFTER MIDNIGHT   Take these medicines the morning of surgery with A SIP OF WATER:  DILTIAZEM / ULORIC / GABAPENTIN / METOPROLOL / PROTONIX / ZOLOFT / MAY TAKE ANTIVERT IF NEEDED OR PHENERGAN IF NEEDED / TAKE ONLY 1/2 DOSE OF NOVALOG INSULIN (15 UNITS) AND 1/2 DOSE OF LANTUS INSULIN (40 UNITS) THE NIGHT BEFORE SURGERY   Do not wear jewelry, make-up   Do not wear lotions, powders, or perfumes.   Do not shave legs or underarms 12 hrs. before surgery (men may shave face)  Do not bring valuables to the hospital.  Contacts, dentures or bridgework may not be worn into surgery.  Leave suitcase in the car. After surgery it may be brought to your room.  For patients admitted to the hospital more than one night, checkout time is 11:00                          The day of discharge.   Patients discharged the day of surgery will not be allowed to drive home                             If going home same day of surgery, must have someone stay with you first                           24 hrs at home and arrange for some one to drive you home from hospital.    Special Instructions:   Please read over the following fact sheets that you were given:               1. MRSA  INFORMATION                      2. [place] PREPARING FOR SURGERY SHEET                                                X_____________________________________________________________________        Failure to follow these instructions may result in cancellation of your surgery

## 2012-04-09 NOTE — Progress Notes (Signed)
04/09/12 0928  OBSTRUCTIVE SLEEP APNEA  Have you ever been diagnosed with sleep apnea through a sleep study? No  Do you snore loudly (loud enough to be heard through closed doors)?  1  Do you often feel tired, fatigued, or sleepy during the daytime? 1  Has anyone observed you stop breathing during your sleep? 0  Do you have, or are you being treated for high blood pressure? 1  BMI more than 35 kg/m2? 1  Age over 65 years old? 1  Neck circumference greater than 40 cm/18 inches? 1  Gender: 0  Obstructive Sleep Apnea Score 6   Score 4 or greater  Results sent to PCP

## 2012-04-09 NOTE — Progress Notes (Signed)
CM[facility] faxed to Dr. Daphine Deutscher -confirmation recieved

## 2012-04-10 ENCOUNTER — Encounter (INDEPENDENT_AMBULATORY_CARE_PROVIDER_SITE_OTHER): Payer: Self-pay

## 2012-04-15 ENCOUNTER — Encounter (HOSPITAL_COMMUNITY): Payer: Self-pay | Admitting: Anesthesiology

## 2012-04-15 ENCOUNTER — Inpatient Hospital Stay (HOSPITAL_COMMUNITY)
Admission: RE | Admit: 2012-04-15 | Discharge: 2012-04-18 | DRG: 565 | Disposition: A | Discharge status: Home / Self Care | Payer: Federal, State, Local not specified - PPO | Source: Ambulatory Visit | Attending: General Surgery | Admitting: General Surgery

## 2012-04-15 ENCOUNTER — Inpatient Hospital Stay (HOSPITAL_COMMUNITY): Payer: Federal, State, Local not specified - PPO

## 2012-04-15 ENCOUNTER — Encounter (HOSPITAL_COMMUNITY)
Admission: RE | Disposition: A | Discharge status: Home / Self Care | Payer: Self-pay | Source: Ambulatory Visit | Attending: General Surgery

## 2012-04-15 ENCOUNTER — Other Ambulatory Visit: Payer: Self-pay

## 2012-04-15 ENCOUNTER — Inpatient Hospital Stay (HOSPITAL_COMMUNITY): Payer: Federal, State, Local not specified - PPO | Admitting: Anesthesiology

## 2012-04-15 ENCOUNTER — Encounter (HOSPITAL_COMMUNITY): Payer: Self-pay | Admitting: *Deleted

## 2012-04-15 DIAGNOSIS — I10 Essential (primary) hypertension: Secondary | ICD-10-CM

## 2012-04-15 DIAGNOSIS — F3289 Other specified depressive episodes: Secondary | ICD-10-CM | POA: Diagnosis present

## 2012-04-15 DIAGNOSIS — I509 Heart failure, unspecified: Secondary | ICD-10-CM | POA: Diagnosis present

## 2012-04-15 DIAGNOSIS — Z86711 Personal history of pulmonary embolism: Secondary | ICD-10-CM

## 2012-04-15 DIAGNOSIS — J9589 Other postprocedural complications and disorders of respiratory system, not elsewhere classified: Secondary | ICD-10-CM

## 2012-04-15 DIAGNOSIS — F329 Major depressive disorder, single episode, unspecified: Secondary | ICD-10-CM | POA: Diagnosis present

## 2012-04-15 DIAGNOSIS — D649 Anemia, unspecified: Secondary | ICD-10-CM

## 2012-04-15 DIAGNOSIS — N183 Chronic kidney disease, stage 3 unspecified: Secondary | ICD-10-CM | POA: Diagnosis present

## 2012-04-15 DIAGNOSIS — I5030 Unspecified diastolic (congestive) heart failure: Secondary | ICD-10-CM | POA: Diagnosis present

## 2012-04-15 DIAGNOSIS — F411 Generalized anxiety disorder: Secondary | ICD-10-CM | POA: Diagnosis present

## 2012-04-15 DIAGNOSIS — E119 Type 2 diabetes mellitus without complications: Secondary | ICD-10-CM

## 2012-04-15 DIAGNOSIS — I129 Hypertensive chronic kidney disease with stage 1 through stage 4 chronic kidney disease, or unspecified chronic kidney disease: Secondary | ICD-10-CM | POA: Diagnosis present

## 2012-04-15 DIAGNOSIS — N189 Chronic kidney disease, unspecified: Secondary | ICD-10-CM

## 2012-04-15 DIAGNOSIS — K219 Gastro-esophageal reflux disease without esophagitis: Secondary | ICD-10-CM | POA: Diagnosis present

## 2012-04-15 DIAGNOSIS — Z6841 Body Mass Index (BMI) 40.0 and over, adult: Secondary | ICD-10-CM

## 2012-04-15 DIAGNOSIS — Z79899 Other long term (current) drug therapy: Secondary | ICD-10-CM

## 2012-04-15 DIAGNOSIS — Z7901 Long term (current) use of anticoagulants: Secondary | ICD-10-CM

## 2012-04-15 DIAGNOSIS — Z794 Long term (current) use of insulin: Secondary | ICD-10-CM

## 2012-04-15 DIAGNOSIS — I4892 Unspecified atrial flutter: Secondary | ICD-10-CM

## 2012-04-15 DIAGNOSIS — J95821 Acute postprocedural respiratory failure: Secondary | ICD-10-CM | POA: Diagnosis not present

## 2012-04-15 HISTORY — PX: GASTRIC ROUX-EN-Y: SHX5262

## 2012-04-15 HISTORY — PX: UPPER GI ENDOSCOPY: SHX6162

## 2012-04-15 LAB — BLOOD GAS, ARTERIAL
Acid-base deficit: 4.5 mmol/L — ABNORMAL HIGH (ref 0.0–2.0)
Acid-base deficit: 4.6 mmol/L — ABNORMAL HIGH (ref 0.0–2.0)
Acid-base deficit: 2.7 mmol/L — ABNORMAL HIGH (ref 0.0–2.0)
Bicarbonate: 23.2 mEq/L (ref 20.0–24.0)
Bicarbonate: 22.7 mEq/L (ref 20.0–24.0)
Drawn by: 313061
Drawn by: 295031
Drawn by: 232811
FIO2: 1 %
FIO2: 0.4 %
FIO2: 0.4 %
MECHVT: POSITIVE mL
Mode: POSITIVE
O2 Saturation: 99.2 %
O2 Saturation: 96.1 %
O2 Saturation: 98.2 %
PEEP: 5 cmH2O
PEEP: 5 cmH2O
Patient temperature: 98.6
Patient temperature: 98.6
Pressure support: 18 cmH2O
Pressure support: 5 cmH2O
RATE: 24 resp/min
TCO2: 21.6 mmol/L (ref 0–100)
pCO2 arterial: 56.7 mmHg — ABNORMAL HIGH (ref 35.0–45.0)
pCO2 arterial: 60 mmHg (ref 35.0–45.0)
pH, Arterial: 7.236 — ABNORMAL LOW (ref 7.350–7.450)
pO2, Arterial: 283 mmHg — ABNORMAL HIGH (ref 80.0–100.0)
pO2, Arterial: 117 mmHg — ABNORMAL HIGH (ref 80.0–100.0)

## 2012-04-15 LAB — PROTIME-INR: Prothrombin Time: 12.9 seconds (ref 11.6–15.2)

## 2012-04-15 LAB — GLUCOSE, CAPILLARY: Glucose-Capillary: 213 mg/dL — ABNORMAL HIGH (ref 70–99)

## 2012-04-15 SURGERY — LAPAROSCOPIC ROUX-EN-Y GASTRIC BYPASS WITH UPPER ENDOSCOPY
Anesthesia: General | Site: Abdomen | Wound class: Clean Contaminated

## 2012-04-15 MED ORDER — MORPHINE SULFATE 2 MG/ML IJ SOLN
2.0000 mg | INTRAMUSCULAR | Status: DC | PRN
  Administered 2012-04-15: 6 mg via INTRAVENOUS
  Filled 2012-04-15: qty 3

## 2012-04-15 MED ORDER — ACETAMINOPHEN 160 MG/5ML PO SOLN: 650.0000 mg | ORAL | Status: DC | PRN

## 2012-04-15 MED ORDER — LACTATED RINGERS IV SOLN
INTRAVENOUS | Status: DC | PRN
  Administered 2012-04-15 (×3): via INTRAVENOUS

## 2012-04-15 MED ORDER — OXYCODONE-ACETAMINOPHEN 5-325 MG/5ML PO SOLN: 5.0000 mL | ORAL | Status: DC | PRN

## 2012-04-15 MED ORDER — UNJURY CHOCOLATE CLASSIC POWDER
2.0000 [oz_av] | Freq: Four times a day (QID) | ORAL | Status: DC
  Administered 2012-04-18: 2 [oz_av] via ORAL
  Filled 2012-04-15 (×4): qty 27

## 2012-04-15 MED ORDER — PANTOPRAZOLE SODIUM 40 MG IV SOLR
40.0000 mg | INTRAVENOUS | Status: DC
  Administered 2012-04-15 – 2012-04-17 (×3): 40 mg via INTRAVENOUS
  Filled 2012-04-15 (×4): qty 40

## 2012-04-15 MED ORDER — SUCCINYLCHOLINE CHLORIDE 20 MG/ML IJ SOLN
INTRAMUSCULAR | Status: DC | PRN
  Administered 2012-04-15: 100 mg via INTRAVENOUS

## 2012-04-15 MED ORDER — INSULIN ASPART 100 UNIT/ML ~~LOC~~ SOLN
SUBCUTANEOUS | Status: DC | PRN
  Administered 2012-04-15: 4 [IU] via SUBCUTANEOUS

## 2012-04-15 MED ORDER — ENOXAPARIN SODIUM 40 MG/0.4ML ~~LOC~~ SOLN
40.0000 mg | Freq: Two times a day (BID) | SUBCUTANEOUS | Status: DC
  Administered 2012-04-16 – 2012-04-18 (×5): 40 mg via SUBCUTANEOUS
  Filled 2012-04-15 (×7): qty 0.4

## 2012-04-15 MED ORDER — ENOXAPARIN SODIUM 40 MG/0.4ML ~~LOC~~ SOLN
40.0000 mg | SUBCUTANEOUS | Status: AC
  Administered 2012-04-15: 40 mg via SUBCUTANEOUS
  Filled 2012-04-15: qty 0.4

## 2012-04-15 MED ORDER — PROPOFOL 10 MG/ML IV BOLUS
INTRAVENOUS | Status: DC | PRN
  Administered 2012-04-15: 200 mg via INTRAVENOUS

## 2012-04-15 MED ORDER — PHENYLEPHRINE HCL 10 MG/ML IJ SOLN
INTRAMUSCULAR | Status: DC | PRN
  Administered 2012-04-15 (×3): 80 ug via INTRAVENOUS

## 2012-04-15 MED ORDER — CHLORHEXIDINE GLUCONATE 0.12 % MT SOLN
15.0000 mL | Freq: Two times a day (BID) | OROMUCOSAL | Status: DC
  Administered 2012-04-15 – 2012-04-17 (×4): 15 mL via OROMUCOSAL
  Filled 2012-04-15 (×6): qty 15

## 2012-04-15 MED ORDER — PROPOFOL 10 MG/ML IV EMUL
5.0000 ug/kg/min | INTRAVENOUS | Status: DC
  Administered 2012-04-15: 10 ug/kg/min via INTRAVENOUS
  Administered 2012-04-16: 15 ug/kg/min via INTRAVENOUS
  Filled 2012-04-15 (×2): qty 100

## 2012-04-15 MED ORDER — METOPROLOL TARTRATE 1 MG/ML IV SOLN
5.0000 mg | Freq: Four times a day (QID) | INTRAVENOUS | Status: DC
  Administered 2012-04-15 – 2012-04-18 (×10): 5 mg via INTRAVENOUS
  Filled 2012-04-15 (×13): qty 5

## 2012-04-15 MED ORDER — FENTANYL CITRATE 0.05 MG/ML IJ SOLN
50.0000 ug | INTRAMUSCULAR | Status: DC | PRN
  Administered 2012-04-15: 100 ug via INTRAVENOUS
  Administered 2012-04-15: 50 ug via INTRAVENOUS
  Administered 2012-04-16: 100 ug via INTRAVENOUS
  Administered 2012-04-16 (×3): 50 ug via INTRAVENOUS
  Filled 2012-04-15 (×6): qty 2

## 2012-04-15 MED ORDER — UNJURY VANILLA POWDER
2.0000 [oz_av] | Freq: Four times a day (QID) | ORAL | Status: DC
  Filled 2012-04-15 (×4): qty 27

## 2012-04-15 MED ORDER — MIDAZOLAM HCL 5 MG/5ML IJ SOLN
INTRAMUSCULAR | Status: DC | PRN
  Administered 2012-04-15: 2 mg via INTRAVENOUS

## 2012-04-15 MED ORDER — LIDOCAINE HCL (CARDIAC) 20 MG/ML IV SOLN
INTRAVENOUS | Status: DC | PRN
  Administered 2012-04-15: 100 mg via INTRAVENOUS

## 2012-04-15 MED ORDER — POTASSIUM CHLORIDE IN NACL 20-0.9 MEQ/L-% IV SOLN
INTRAVENOUS | Status: DC
  Administered 2012-04-15: 125 mL/h via INTRAVENOUS
  Administered 2012-04-16: 03:00:00 via INTRAVENOUS
  Administered 2012-04-16: 75 mL/h via INTRAVENOUS
  Administered 2012-04-17 – 2012-04-18 (×3): via INTRAVENOUS
  Filled 2012-04-15 (×8): qty 1000

## 2012-04-15 MED ORDER — UNJURY CHICKEN SOUP POWDER
2.0000 [oz_av] | Freq: Four times a day (QID) | ORAL | Status: DC
  Filled 2012-04-15 (×4): qty 27

## 2012-04-15 MED ORDER — BUPIVACAINE-EPINEPHRINE 0.25% -1:200000 IJ SOLN
INTRAMUSCULAR | Status: DC | PRN
  Administered 2012-04-15: 33 mL

## 2012-04-15 MED ORDER — GLYCOPYRROLATE 0.2 MG/ML IJ SOLN
INTRAMUSCULAR | Status: DC | PRN
  Administered 2012-04-15: 0.6 mg via INTRAVENOUS

## 2012-04-15 MED ORDER — HYDROMORPHONE HCL PF 1 MG/ML IJ SOLN
0.2500 mg | INTRAMUSCULAR | Status: DC | PRN
  Administered 2012-04-15 (×2): 0.25 mg via INTRAVENOUS
  Administered 2012-04-15: 0.5 mg via INTRAVENOUS

## 2012-04-15 MED ORDER — PROMETHAZINE HCL 25 MG/ML IJ SOLN: 6.2500 mg | INTRAMUSCULAR | Status: DC | PRN

## 2012-04-15 MED ORDER — LABETALOL HCL 5 MG/ML IV SOLN
INTRAVENOUS | Status: DC | PRN
  Administered 2012-04-15: 5 mg via INTRAVENOUS

## 2012-04-15 MED ORDER — TISSEEL VH 10 ML EX KIT
PACK | CUTANEOUS | Status: DC | PRN
  Administered 2012-04-15: 10 mL

## 2012-04-15 MED ORDER — ONDANSETRON HCL 4 MG/2ML IJ SOLN: 4.0000 mg | INTRAMUSCULAR | Status: DC | PRN

## 2012-04-15 MED ORDER — ONDANSETRON HCL 4 MG/2ML IJ SOLN
INTRAMUSCULAR | Status: DC | PRN
  Administered 2012-04-15: 4 mg via INTRAVENOUS

## 2012-04-15 MED ORDER — NEOSTIGMINE METHYLSULFATE 1 MG/ML IJ SOLN
INTRAMUSCULAR | Status: DC | PRN
  Administered 2012-04-15: 5 mg via INTRAVENOUS

## 2012-04-15 MED ORDER — BIOTENE DRY MOUTH MT LIQD
15.0000 mL | Freq: Four times a day (QID) | OROMUCOSAL | Status: DC
  Administered 2012-04-16 – 2012-04-17 (×5): 15 mL via OROMUCOSAL

## 2012-04-15 MED ORDER — INSULIN GLARGINE 100 UNIT/ML ~~LOC~~ SOLN
20.0000 [IU] | Freq: Every day | SUBCUTANEOUS | Status: DC
  Administered 2012-04-15 – 2012-04-17 (×3): 20 [IU] via SUBCUTANEOUS

## 2012-04-15 MED ORDER — FENTANYL CITRATE 0.05 MG/ML IJ SOLN
INTRAMUSCULAR | Status: DC | PRN
  Administered 2012-04-15 (×3): 50 ug via INTRAVENOUS

## 2012-04-15 MED ORDER — DEXTROSE 5 % IV SOLN
2.0000 g | INTRAVENOUS | Status: AC
  Administered 2012-04-15: 2 g via INTRAVENOUS
  Filled 2012-04-15: qty 2

## 2012-04-15 MED ORDER — INSULIN ASPART 100 UNIT/ML ~~LOC~~ SOLN
SUBCUTANEOUS | Status: AC
  Filled 2012-04-15: qty 1

## 2012-04-15 MED ORDER — ACETAMINOPHEN 10 MG/ML IV SOLN
INTRAVENOUS | Status: DC | PRN
  Administered 2012-04-15: 1000 mg via INTRAVENOUS

## 2012-04-15 MED ORDER — ROCURONIUM BROMIDE 100 MG/10ML IV SOLN
INTRAVENOUS | Status: DC | PRN
  Administered 2012-04-15: 10 mg via INTRAVENOUS
  Administered 2012-04-15: 40 mg via INTRAVENOUS
  Administered 2012-04-15 (×5): 10 mg via INTRAVENOUS

## 2012-04-15 MED ORDER — EPHEDRINE SULFATE 50 MG/ML IJ SOLN
INTRAMUSCULAR | Status: DC | PRN
  Administered 2012-04-15: 5 mg via INTRAVENOUS
  Administered 2012-04-15 (×2): 10 mg via INTRAVENOUS
  Administered 2012-04-15: 15 mg via INTRAVENOUS
  Administered 2012-04-15: 10 mg via INTRAVENOUS

## 2012-04-15 MED ORDER — LACTATED RINGERS IR SOLN
Status: DC | PRN
  Administered 2012-04-15: 3000 mL

## 2012-04-15 MED ORDER — INSULIN ASPART 100 UNIT/ML ~~LOC~~ SOLN
0.0000 [IU] | SUBCUTANEOUS | Status: DC
  Administered 2012-04-15 (×2): 7 [IU] via SUBCUTANEOUS
  Administered 2012-04-16 – 2012-04-18 (×5): 3 [IU] via SUBCUTANEOUS

## 2012-04-15 SURGICAL SUPPLY — 81 items
APPLICATOR COTTON TIP 6IN STRL (MISCELLANEOUS) ×6 IMPLANT
APPLIER CLIP ROT 13.4 12 LRG (CLIP)
BENZOIN TINCTURE PRP APPL 2/3 (GAUZE/BANDAGES/DRESSINGS) IMPLANT
BLADE SURG 15 STRL LF DISP TIS (BLADE) IMPLANT
BLADE SURG 15 STRL SS (BLADE)
BLADE SURG SZ11 CARB STEEL (BLADE) ×3 IMPLANT
CABLE HIGH FREQUENCY MONO STRZ (ELECTRODE) ×3 IMPLANT
CANISTER SUCTION 2500CC (MISCELLANEOUS) ×3 IMPLANT
CHLORAPREP W/TINT 26ML (MISCELLANEOUS) ×3 IMPLANT
CLIP APPLIE ROT 13.4 12 LRG (CLIP) IMPLANT
CLIP SUT LAPRA TY ABSORB (SUTURE) ×6 IMPLANT
CLOTH BEACON ORANGE TIMEOUT ST (SAFETY) ×3 IMPLANT
COVER SURGICAL LIGHT HANDLE (MISCELLANEOUS) ×3 IMPLANT
CUTTER LINEAR ENDO ART 45 ETS (STAPLE) ×3 IMPLANT
DECANTER SPIKE VIAL GLASS SM (MISCELLANEOUS) ×3 IMPLANT
DERMABOND ADVANCED (GAUZE/BANDAGES/DRESSINGS) ×2
DERMABOND ADVANCED .7 DNX12 (GAUZE/BANDAGES/DRESSINGS) ×4 IMPLANT
DEVICE SUTURE ENDOST 10MM (ENDOMECHANICALS) ×3 IMPLANT
DISSECTOR BLUNT TIP ENDO 5MM (MISCELLANEOUS) IMPLANT
DRAIN CHANNEL 19F RND (DRAIN) ×3 IMPLANT
DRAIN PENROSE 18X1/4 LTX STRL (WOUND CARE) ×3 IMPLANT
DRAPE CAMERA CLOSED 9X96 (DRAPES) ×3 IMPLANT
ELECT REM PT RETURN 9FT ADLT (ELECTROSURGICAL) ×3
ELECTRODE REM PT RTRN 9FT ADLT (ELECTROSURGICAL) ×2 IMPLANT
EVACUATOR 1/8 PVC DRAIN (DRAIN) ×3 IMPLANT
GAUZE SPONGE 4X4 16PLY XRAY LF (GAUZE/BANDAGES/DRESSINGS) ×3 IMPLANT
GLOVE BIOGEL PI IND STRL 7.0 (GLOVE) ×4 IMPLANT
GLOVE BIOGEL PI INDICATOR 7.0 (GLOVE) ×2
GLOVE SURG SS PI 8.5 STRL IVOR (GLOVE) ×2
GLOVE SURG SS PI 8.5 STRL STRW (GLOVE) ×4 IMPLANT
GOWN PREVENTION PLUS XXLARGE (GOWN DISPOSABLE) ×3 IMPLANT
GOWN STRL NON-REIN LRG LVL3 (GOWN DISPOSABLE) ×3 IMPLANT
GOWN STRL REIN XL XLG (GOWN DISPOSABLE) ×12 IMPLANT
HEMOSTAT SURGICEL 4X8 (HEMOSTASIS) IMPLANT
HOVERMATT SINGLE USE (MISCELLANEOUS) ×3 IMPLANT
KIT BASIN OR (CUSTOM PROCEDURE TRAY) ×3 IMPLANT
KIT GASTRIC LAVAGE 34FR ADT (SET/KITS/TRAYS/PACK) ×3 IMPLANT
MARKER SKIN DUAL TIP RULER LAB (MISCELLANEOUS) ×3 IMPLANT
NEEDLE SPNL 22GX3.5 QUINCKE BK (NEEDLE) ×3 IMPLANT
NS IRRIG 1000ML POUR BTL (IV SOLUTION) ×3 IMPLANT
PACK CARDIOVASCULAR III (CUSTOM PROCEDURE TRAY) ×3 IMPLANT
POUCH SPECIMEN RETRIEVAL 10MM (ENDOMECHANICALS) IMPLANT
RELOAD 45 VASCULAR/THIN (ENDOMECHANICALS) ×6 IMPLANT
RELOAD BLUE (STAPLE) ×6 IMPLANT
RELOAD ENDO STITCH 2.0 (ENDOMECHANICALS) ×11
RELOAD GOLD (STAPLE) ×3 IMPLANT
RELOAD STAPLE TA45 3.5 REG BLU (ENDOMECHANICALS) ×6 IMPLANT
RELOAD WHITE ECR60W (STAPLE) ×3 IMPLANT
SCALPEL HARMONIC ACE (MISCELLANEOUS) ×3 IMPLANT
SCISSORS LAP 5X35 DISP (ENDOMECHANICALS) ×3 IMPLANT
SEALANT SURGICAL APPL DUAL CAN (MISCELLANEOUS) ×3 IMPLANT
SET IRRIG TUBING LAPAROSCOPIC (IRRIGATION / IRRIGATOR) ×3 IMPLANT
SLEEVE ADV FIXATION 12X100MM (TROCAR) ×9 IMPLANT
SLEEVE ADV FIXATION 5X100MM (TROCAR) ×3 IMPLANT
SOLUTION ANTI FOG 6CC (MISCELLANEOUS) ×3 IMPLANT
SPONGE DRAIN TRACH 4X4 STRL 2S (GAUZE/BANDAGES/DRESSINGS) ×3 IMPLANT
SPONGE GAUZE 4X4 12PLY (GAUZE/BANDAGES/DRESSINGS) IMPLANT
STAPLER STANDARD HANDLE (STAPLE) ×3 IMPLANT
STAPLER VISISTAT 35W (STAPLE) ×3 IMPLANT
STRIP CLOSURE SKIN 1/2X4 (GAUZE/BANDAGES/DRESSINGS) IMPLANT
SUT ETHILON 2 0 PS N (SUTURE) ×3 IMPLANT
SUT ETHILON 3 0 PS 1 (SUTURE) IMPLANT
SUT MNCRL AB 4-0 PS2 18 (SUTURE) ×6 IMPLANT
SUT RELOAD ENDO STITCH 2 48X1 (ENDOMECHANICALS) ×10
SUT RELOAD ENDO STITCH 2.0 (ENDOMECHANICALS) ×12
SUT VIC AB 2-0 SH 27 (SUTURE) ×1
SUT VIC AB 2-0 SH 27X BRD (SUTURE) ×2 IMPLANT
SUTURE RELOAD END STTCH 2 48X1 (ENDOMECHANICALS) ×10 IMPLANT
SUTURE RELOAD ENDO STITCH 2.0 (ENDOMECHANICALS) ×12 IMPLANT
SYR 20CC LL (SYRINGE) ×3 IMPLANT
SYR 50ML LL SCALE MARK (SYRINGE) ×3 IMPLANT
SYR CONTROL 10ML LL (SYRINGE) ×3 IMPLANT
TAPE CLOTH SURG 4X10 WHT LF (GAUZE/BANDAGES/DRESSINGS) ×3 IMPLANT
TOWEL OR 17X26 10 PK STRL BLUE (TOWEL DISPOSABLE) ×3 IMPLANT
TRAY FOLEY CATH 14FRSI W/METER (CATHETERS) ×3 IMPLANT
TROCAR ADV FIXATION 12X100MM (TROCAR) ×3 IMPLANT
TROCAR XCEL 12X100 BLDLESS (ENDOMECHANICALS) ×3 IMPLANT
TROCAR Z-THREAD FIOS 5X100MM (TROCAR) ×3 IMPLANT
TUBING ENDO SMARTCAP (MISCELLANEOUS) ×3 IMPLANT
TUBING FILTER THERMOFLATOR (ELECTROSURGICAL) ×3 IMPLANT
WATER STERILE IRR 1500ML POUR (IV SOLUTION) ×3 IMPLANT

## 2012-04-15 NOTE — Transfer of Care (Signed)
Immediate Anesthesia Transfer of Care Note  Patient: Zayanna J Butz  Procedure(s) Performed: Procedure(s) (LRB) with comments: LAPAROSCOPIC ROUX-EN-Y GASTRIC BYPASS WITH UPPER ENDOSCOPY (N/A) UPPER GI ENDOSCOPY ()  Patient Location: PACU  Anesthesia Type:General  Level of Consciousness: sedated  Airway & Oxygen Therapy: Patient placed on Ventilator (see vital sign flow sheet for setting)  Post-op Assessment: Report given to PACU RN and Post -op Vital signs reviewed and stable  Post vital signs: Reviewed and stable  Complications: Patient weak and floppy, opens eyes to command,Patient placed on vent with PS 18 100% FIO2;Patient stronger in PACU/ Will monitor

## 2012-04-15 NOTE — Consult Note (Addendum)
Name: [name] [name] DOB: 20-Oct-1945 MRN: [id number] PCP: Katy Apo, MD ADMIT DATE: 04/15/2012 LOS: 0 Referred by Dr Johna Sheriff Cardiologist: Dr Anne Fu  Date of consult: 04/15/2012  PCCM CONSULT NOTE  HPI 67 year old morbidly obese- Body mass index is 51.28 kg/(m^2). female with multiple medical problems underwent Laparoscopic Roux-en-Y gastric bypass on 04/15/2012. This was an elective procedure for obesity. Postoperatively she could not be extubated presumably due to retention of general anesthesia. Therefore she has been admitted to the intensive care unit for acute postoperative respiratory failure. 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 Heart murmur   . Shortness of breath   . Blood dyscrasia   . Mental disorder   . Hypertension   . CHF (congestive heart failure)   . 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 Blood transfusion   . GERD (gastroesophageal reflux disease)   . Arthritis   . Anxiety   . Diabetes mellitus   . Anemia   . Depression   . Chronic kidney disease, stage III (moderate)   . Pulmonary embolism APPROX 5 YRS AGO    on lifelong coumadin  . RENAL INSUFFICIENCY, CHRONIC 03/19/2010  . Pneumonia     hx of bronchitis  . Chronic back pain   . Gout     hx of left foot  . Shingles     RT SIDE OF FACE  . Sciatic nerve pain     right leg  . Dysrhythmia     sees Dr.  Anne Fu     History reviewed. No pertinent family history.   History   Social History  . Marital Status: Single    Spouse Name: N/A    Number of Children: N/A  . Years of Education: N/A   Occupational  History  . Not on file.   Social History Main Topics  . Smoking status: Never Smoker   . Smokeless tobacco: Never Used  . Alcohol Use: No  . Drug Use: No  . Sexually Active: Not Currently -- Female partner(s)    Birth Control/ Protection: Post-menopausal   Other Topics Concern  . Not on file   Social History Narrative  . No narrative on file     Allergies  Allergen Reactions  . Other Swelling    Start hurting  Anything acidy  . Shellfish Allergy Nausea And Vomiting    Abdominal pain          Lines / Drains:  intubation 04/15/2012 >>    Micro: No results found for this basename: LATICACIDVEN:5,PCT:5 in the last 168 hours Recent Results (from the past 240 hour(s))  SURGICAL PCR SCREEN     Status: Normal   Collection Time   04/09/12  9:12 AM      Component Value Range Status Comment   MRSA, PCR NEGATIVE  NEGATIVE Final    Staphylococcus aureus NEGATIVE  NEGATIVE Final     Antibiotics: Anti-infectives     Start     Dose/Rate Route Frequency Ordered Stop   04/15/12 0940   cefOXitin (MEFOXIN) 2 g in dextrose 5 % 50 mL IVPB  2 g 100 mL/hr over 30 Minutes Intravenous On call to O.R. 04/15/12 0940 04/15/12 1119          Tests / Events: 04/15/2012 > admit and laparoscopic surgery gastric bypass for obesity. Postoperatively acute respiratory failure    Subjective/Overnight/Interval History: -   Vital Signs: Temp:  [97.7 F (36.5 C)-98.1 F (36.7 C)] 97.8 F (36.6 C) (01/20 1730) Pulse Rate:  [65-96] 65  (01/20 1745) Resp:  [11-22] 11  (01/20 1745) BP: (124-175)/(72-87) 124/73 mmHg (01/20 1745) SpO2:  [95 %-100 %] 100 % (01/20 1745) FiO2 (%):  [40 %-100 %] 40 % (01/20 1745) Weight:  [131.316 kg (289 lb 8 oz)] 131.316 kg (289 lb 8 oz) (01/20 0944)    Physical Examination: General: Morbidly obese female looking critically ill Neuro:  Sedated on morphine when necessary. Current RASS -3 HEENT:  Obese neck Neck:    Pulse equal and reactive to  light Cardiovascular:  Rate 66 in atrial flutter with normal blood pressure. Murmur not otherwise specified Chest: Obese chest mildly dyssynchronous on the ventilator Lungs:  No specific crackles Abdomen:  Obese, status post surgery Musculoskeletal:  Chronic DJD Skin:  He is intact Extremities: No cyanosis no clubbing no edema  Ventilator settings: Vent Mode:  [-] PSV;CPAP FiO2 (%):  [40 %-100 %] 40 % PEEP:  [5 cmH20] 5 cmH20 Pressure Support:  [18 cmH20] 18 cmH20 Plateau Pressure:  [31 cmH20] 31 cmH20  Radiology: x 48h  No results found.  PROBLEM LIST  Active Problems:  Acute postoperative respiratory insufficiency    ASSESSMENT AND PLAN  RESPIRATORY  Lab 04/15/12 1800 04/15/12 1647  PHART 7.217* 7.236*  PCO2ART 60.0* 56.7*  PO2ART 99.0 283.0*  HCO3 23.5 23.2  O2SAT 96.1 99.2   A:   - Baseline morbid obesity and a history of  pulmonary embolism on lifelong Coumadin - Possible underlying sleep apnea - Admitted on 04/15/2012 acute postoperative respiratory failure due to retention of anesthesia  P: Full ventilator support; not ready for extubation Full doseAnticoagulations as soon as possible (note she had refused prophylactic IVC filter prior to surgery) DVT proph dose lovenox since 04/15/2012 per [facility]   CARDIAC No results found for this basename: TROPONINI:5,PROBNP:5 in the last 168 hours A: Baseline chronic atrial flutter and diastolic heart failure not otherwise specified P: Lopressor when necessary BNP  NEUROLOGIC A:   - Baseline depression and anxiety - Postop due to pain postoperatively - She is at risk for ICU delirium due to pain, surgery and psychiatry issues P: - Propofol for sedation along with fentanyl when necessary - restart psychiatry drugs soon as possible   INFECTIOUS DISEASE  Lab 04/09/12 1015  WBC 5.7  PROCALCITON --  LATICACIDVEN --   A:  No evidence of acute infection P: Monitor     ELECTROLYTES and rENAL  Lab 04/09/12  1015  NA 136  K 3.6  CL 98  CO2 25  BUN 32*  CREATININE 1.74*  CALCIUM 8.8  MG --  PHOS --   A:  Stage III chronic kidney disease at the baseline. Baseline creatinine around 1.7-2 mg P: Monitor, ensure adequate hydration    HEMATOLOGIC  Lab 04/15/12 1008 04/09/12 1015  HGB -- 12.7  HCT -- 38.7  PLT -- 315  INR 0.98 1.57*  APTT -- 32   A:  At risk foranemia of critical illness P: - PRBC for hgb </= 6.9gm%    - exceptions are   -  if ACS susepcted/confirmed then transfuse  for hgb </= 8.0gm%,  or    -  If septic shock first 24h and scvo2 < 70% then transfuse for hgb </= 9.0gm%   - active bleeding with hemodynamic instability, then transfuse regardless of hemoglobin value   At at all times try to transfuse 1 unit prbc as possible with exception of active hemorrhage    GASTROINTESTINAL  Lab 04/15/12 1008 04/09/12 1015  AST -- 31  ALT -- 25  ALKPHOS -- 109  BILITOT -- 0.6  PROT -- 7.6  ALBUMIN -- 3.5  INR 0.98 1.57*  AMYLASE -- --  LIPASE -- --   A:  Status post gastric bypass on 04/15/2012 P: Per [facility] [place] surgery  ENDOCRINE  Lab 04/15/12 1551 04/15/12 1427 04/15/12 1010  GLUCAP 213* 251* 200*  TSH -- -- --  CORTISOL -- -- --   A: Known diabetes P:  Sliding scale insulin protocol  DERMATOLOGY A: At risk for sacral decub P: Standard precautions    GLOBAL:  - no family at bedside   The patient is critically ill with multiple organ systems failure and requires high complexity decision making for assessment and support, frequent evaluation and titration of therapies, application of advanced monitoring technologies and extensive interpretation of multiple databases.   Critical Care Time devoted to patient care services described in this note is  45  Minutes.  Dr. Kalman Shan, M.D., [facility]C.P Pulmonary and Critical Care Medicine Staff Physician [facility] Pager: [phone], If no answer  or between  15:00h - 7:00h: call [phone]  04/15/2012 7:16 PM

## 2012-04-15 NOTE — Anesthesia Postprocedure Evaluation (Signed)
  Anesthesia Post-op Note  Patient: [name] [name]  Procedure(s) Performed: Procedure(s) (LRB) with comments: LAPAROSCOPIC ROUX-EN-Y GASTRIC BYPASS WITH UPPER ENDOSCOPY (N/A) UPPER GI ENDOSCOPY ()  Patient Location: PACU  Anesthesia Type:General  Level of Consciousness: sedated and lethargic  Airway and Oxygen Therapy: Patient remains intubated per anesthesia plan and Patient placed on Ventilator (see vital sign flow sheet for setting)  Post-op Pain: moderate  Post-op Assessment: Post-op Vital signs reviewed and RESPIRATORY FUNCTION UNSTABLE  Post-op Vital Signs: Reviewed  Complications: No apparent anesthesia complications

## 2012-04-15 NOTE — H&P (Signed)
Hx  Pt presents for planned laparoscopic Roux-en-Y gastric bypass for morbid obesity with multiple comorbidities as listed below. She has a progressive history of morbid obesity unresponsive to medical management. She has been previously evaluated by Dr. Daphine Deutscher in our office and then through a full valuation workup. Her surgery was scheduled for today Dr. Daphine Deutscher was unable to attend due to illness and the patient is agreeable for me to do the surgery. She currently is feeling well without specific complaints. She has had full explanation of the procedure and reviewed the consent form and she has no questions regarding the nature of the procedure or indications.  Past Medical History  Diagnosis Date  . Heart murmur   . Shortness of breath   . Blood dyscrasia   . Mental disorder   . Hypertension   . CHF (congestive heart failure)   . 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 Blood transfusion   . GERD (gastroesophageal reflux disease)   . Arthritis   . Anxiety   . Diabetes mellitus   . Anemia   . Depression   . Chronic kidney disease, stage III (moderate)   . Pulmonary embolism APPROX 5 YRS AGO    on lifelong coumadin  . RENAL INSUFFICIENCY, CHRONIC 03/19/2010  . Pneumonia     hx of bronchitis  . Chronic back pain   . Gout     hx of left foot  . Shingles     RT SIDE OF FACE  . Sciatic nerve pain     right leg  . Dysrhythmia     sees Dr.  Anne Fu   Past Surgical History  Procedure Date  . Vascular surgery     vein removed in legs  . Dilation and curettage of uterus   . Joint replacement     both knees  . Tubal ligation   . Shoulder surgery     right shoulder I& D secondary to septic joint  . Laser ablation     "for dysrhythmia's approx. 4 years ago"  . Mammary 11/06/2011  . Breast reduction surgery 11/06/2011    Procedure: MAMMARY REDUCTION  (BREAST);  Surgeon: Louisa Second, MD;  Location: [facility] OR;  Service: Plastics;  Laterality: Bilateral;  . Breath tek h pylori  03/08/2012    Procedure: BREATH TEK H PYLORI;  Surgeon: Valarie Merino, MD;  Location: [facility];  Service: General;  Laterality: N/A;   Current Facility-Administered Medications  Medication Dose Route Frequency Provider Last Rate Last Dose  . cefOXitin (MEFOXIN) 2 g in dextrose 5 % 50 mL IVPB  2 g Intravenous On Call to OR Valarie Merino, MD       Allergies  Allergen Reactions  . Other Swelling    Start hurting  Anything acidy  . Shellfish Allergy Nausea And Vomiting    Abdominal pain   Current Outpatient Prescriptions  Medication  Sig  Dispense  Refill  .  B-D INS SYR ULTRAFINE 1CC/30G 30G X 1/2" 1 ML MISC  .  chlorhexidine (HIBICLENS) 4 % external liquid  Apply 60 mLs (4 application total) topically daily.  120 mL  3  .  diltiazem (CARDIZEM CD) 120 MG 24 hr capsule  Take 120 mg by mouth daily.  Marland Kitchen  doxycycline (VIBRA-TABS) 100 MG tablet  Take 1 tablet (100 mg total) by mouth 2 (two) times daily.  60 tablet  1  .  febuxostat (ULORIC) 40 MG tablet  Take 80 mg by  mouth daily.  .  fluconazole (DIFLUCAN) 150 MG tablet  .  furosemide (LASIX) 80 MG tablet  Take 80 mg by mouth 2 (two) times daily.  Marland Kitchen  gabapentin (NEURONTIN) 600 MG tablet  Take 600 mg by mouth 3 (three) times daily.  .  insulin aspart (NOVOLOG) 100 UNIT/ML injection  Inject 30 Units into the skin 2 (two) times daily. Per sliding scale  .  meclizine (ANTIVERT) 25 MG tablet  Take 25 mg by mouth daily.  .  metoprolol (TOPROL-XL) 100 MG 24 hr tablet  Take 25 mg by mouth daily.  .  mupirocin cream (BACTROBAN) 2 %  Apply topically 2 (two) times daily.  15 g  0  .  potassium chloride (K-DUR) 10 MEQ tablet  Take 20 mEq by mouth 2 (two) times daily.  .  promethazine (PHENERGAN) 25 MG tablet  Take 25 mg by mouth every 4 (four) hours as needed. For nausea and vomiting  .  sertraline (ZOLOFT) 50 MG tablet  Take 50 mg by mouth daily.  .  simvastatin (ZOCOR) 40 MG tablet  .  warfarin  (COUMADIN) 5 MG tablet  daily.  Marland Kitchen  zolpidem (AMBIEN) 10 MG tablet  Take 10 mg by mouth at bedtime.  .  insulin glargine (LANTUS) 100 UNIT/ML injection  Inject 80 Units into the skin 2 (two) times daily.    Exam: BP 154/72  Pulse 73  Temp 97.7 F (36.5 C) (Oral)  Resp 20  Ht 5\' 3"  (1.6 m)  Wt 289 lb 8 oz (131.316 kg)  BMI 51.28 kg/m2  SpO2 100% General: Morbidly obese in no distress Skin: Warm and dry without rash or infection Lungs: Clear equal breath sounds bilaterally Cardiac: Regular rate without murmur. No edema. Abdomen: Obese soft and nontender Extremities: No edema or joint deformity Neurologic: She is alert and fully oriented. Affect normal.  Assessment and plan: Progressive morbid obesity unresponsive to medical management with multiple comorbidities. She is admitted for elective laparoscopic Roux-en-Y gastric bypass.

## 2012-04-15 NOTE — Op Note (Signed)
Preoperative diagnosis: morbid obesity BMI 51.4  Postoperative diagnosis: Same   Procedure: Esophagogastrojejunoscopy   Surgeon: Mary Sella. Biran Mayberry M.D., FACS   Anesthesia: Gen.   Indications for procedure: 67 yo AAF undergoing laparoscopic roux en y gastric bypass and an upper endoscopy was requested to evaluate the anastomosis.   Description of procedure: After we have completed the new gastrojejunostomy, I scrubbed out and obtained the Olympus endoscope. I gently placed endoscope in the patient's oropharynx and gently glided it down the esophagus without any difficulty under direct visualization. Once I was in the gastric pouch, I insufflated the pouch was air. The pouch was approximately 5 cm in size (GE jxn 40, G-J anastomosis 45 cm). I was able to cannulate and advanced the scope through the gastrojejunostomy. Dr. Johna Sheriff had placed saline in the upper abdomen. Upon further insufflation of the gastric pouch there was no evidence of bubbles. Upon further inspection of the gastric pouch, the mucosa appeared normal. There is no evidence of any mucosal abnormality. The gastric pouch and Roux limb were decompressed. The width of the gastrojejunal anastomosis was at least 2.5 cm. The scope was withdrawn. The patient tolerated this portion of the procedure well. Please see Dr Jamse Mead operative note for details regarding the laparoscopic roux-en-y gastric bypass.  Mary Sella. Andrey Campanile, MD, FACS General, Bariatric, & Minimally Invasive Surgery [facility], [place]

## 2012-04-15 NOTE — Progress Notes (Signed)
Moving arms and legs spontaneously. Reaching for ETT.Strong grips, does not let go. Able to hold head for 5 count. ABG drawn. Dr. Rica Mast called.

## 2012-04-15 NOTE — Op Note (Signed)
Preop diagnosis: Morbid obesity  Postop diagnosis: Morbid obesity  Body mass index is 51.28 kg/(m^2).  Surgical procedure: Laparoscopic Roux-en-Y gastric bypass  Surgeon: Sharlet Salina T.Brenon Antosh M.D.  Asst.: Gaynelle Adu M.D.  Anesthesia: General  Complications:  None  EBL: Minimal  Drains: None  Disposition: PACU in good condition  Description of procedure: Patient is brought to the operating room and general anesthesia induced. She had received preoperative broad-spectrum IV antibiotics and subcutaneous heparin. The abdomen was widely sterilely prepped and draped. Patient timeout was performed and correct patient and procedure confirmed. Access was obtained with a 12 mm Optiview trocar in the left upper quadrant and pneumoperitoneum established without difficulty. Under direct vision 12 mm trocars were placed laterally in the right upper quadrant, right upper quadrant midclavicular line, and to the left and above the umbilicus for the camera port. A 5 mm trocar was placed laterally in the left upper quadrant.  There were extensive omental adhesions in tne pelvis that were taken down with the harmonic scalpel, no evidence of bowel involvement or injury. The omentum was brought into the upper abdomen and the transverse mesocolon elevated and the ligament of Treitz clearly identified. A 40 cm biliopancreatic limb was then carefully measured from the ligament of Treitz. The small intestine was divided at this point with a single firing of the white load linear stapler. A Penrose drain was sutured to the end of the Roux-en-Y limb for later identification. A 100 cm Roux-en-Y limb was then carefully measured. At this point a side-to-side anastomosis was created between the Roux limb and the end of the biliopancreatic limb. This was accomplished with a single firing of the 45 mm white load linear stapler. The common enterotomy was closed with a running 2-0 Vicryl begun at either end of the enterotomy and  tied centrally. The mesenteric defect was then closed with running 2-0 silk. The omentum was then divided with the harmonic scalpel up towards the transverse colon to allow mobility of the Roux limb toward the gastric pouch. The patient was then placed in steep reversed Trendelenburg. Through a 5 mm subxiphoid site the [facility] retractor was placed and the left lobe of the liver elevated with excellent exposure of the upper stomach and hiatus.  The liver was enlarged and fatty but we able to obtain good exposure. The angle of Hiss was then mobilized with the harmonic scalpel. A 4 cm gastric pouch was then carefully measured along the lesser curve of the stomach. Dissection was carried along the lesser curve at this point with the Harmonic scalpel working carefully back toward the lesser sac at right angles to the lesser curve. The free lesser sac was then entered. After being sure all tubes were removed from the stomach an initial firing of the gold load 60 mm linear stapler was fired at right angles across the lesser curve for about 4 cm. The gastric pouch was further mobilized posteriorly and then the pouch was completed with 2 further firings of the 60 mm blue load linear stapler up through the previously dissected angle of His. It was ensured that the pouch was completely mobilized away from the gastric remnant. This created a nice tubular 4-5 cm gastric pouch. The staple line of the gastric remnant was then oversewn with 2-0 silk for hemostasis. The Roux limb was then brought up in an antecolic fashion with the candycane facing to the patient's left without undue tension. The gastrojejunostomy was created with an initial posterior row of 2-0 Vicryl between  the Roux limb and the staple line of the gastric pouch. Enterotomies were then made in the gastric pouch and the Roux limb with the harmonic scalpel and at approximately 2-2-1/2 cm anastomosis was created with a single firing of the blue load linear stapler.  The staple line was inspected and was intact without bleeding. The common enterotomy was then closed with running 2-0 Vicryl begun at either end and tied centrally. The wall tube was then easily passed through the anastomosis and an outer anterior layer of running 2-0 Vicryl was placed.  At this point I noticed thinning of the gastric side of the anastamosis for a short distance on the left side including a pinpoint mucosal opening. I carefully extended this suture beyound the thinned area incorporating full thickness gastric wall. The Ewald tube was removed. With the outlet of the gastrojejunostomy clamped and under saline irrigation the assistant performed upper endoscopy and with the gastric pouch tensely distended with air there was no evidence of leak. The pouch was desufflated. The Vonita Moss defect was closed with running 2-0 silk. The abdomen was inspected for any evidence of bleeding or bowel injury and everything looked fine. The Nathanson retractor was removed under direct vision after coating the anastomosis with Tisseel tissue sealant. And a 19 Blake drain was left at the anastamosis and brought out through the left lateral trocar site. All CO2 was evacuated and trochars removed. Skin incisions were closed with staples. Sponge needle and instrument counts were correct. The patient was taken to the PACU in good condition.     Mariella Saa MD, FACS  04/15/2012, 3:09 PM

## 2012-04-15 NOTE — Anesthesia Preprocedure Evaluation (Addendum)
Anesthesia Evaluation  Patient identified by MRN, date of birth, ID band Patient awake    Reviewed: Allergy & Precautions, H&P , NPO status , Patient's Chart, lab work & pertinent test results, reviewed documented beta blocker date and time   Airway Mallampati: II TM Distance: >3 FB Neck ROM: full    Dental  (+) Edentulous Upper   Pulmonary shortness of breath, pneumonia -, resolved,  breath sounds clear to auscultation  Pulmonary exam normal       Cardiovascular Exercise Tolerance: Good hypertension, On Medications +CHF + dysrhythmias + Valvular Problems/Murmurs Rhythm:regular Rate:Normal     Neuro/Psych PSYCHIATRIC DISORDERS  Neuromuscular disease negative neurological ROS  negative psych ROS   GI/Hepatic negative GI ROS, Neg liver ROS, GERD-  Medicated,  Endo/Other  diabetes, Type 1, Insulin DependentMorbid obesity  Renal/GU Renal InsufficiencyRenal diseaseCr 1.74  negative genitourinary   Musculoskeletal   Abdominal   Peds  Hematology negative hematology ROS (+) Blood dyscrasia, anemia ,   Anesthesia Other Findings   Reproductive/Obstetrics negative OB ROS                         Anesthesia Physical Anesthesia Plan  ASA: III  Anesthesia Plan: General   Post-op Pain Management:    Induction:   Airway Management Planned: Oral ETT  Additional Equipment:   Intra-op Plan:   Post-operative Plan:   Informed Consent: I have reviewed the patients History and Physical, chart, labs and discussed the procedure including the risks, benefits and alternatives for the proposed anesthesia with the patient or authorized representative who has indicated his/her understanding and acceptance.   Dental Advisory Given  Plan Discussed with: CRNA  Anesthesia Plan Comments:         Anesthesia Quick Evaluation

## 2012-04-16 ENCOUNTER — Encounter (HOSPITAL_COMMUNITY): Payer: Self-pay

## 2012-04-16 ENCOUNTER — Inpatient Hospital Stay (HOSPITAL_COMMUNITY): Payer: Federal, State, Local not specified - PPO

## 2012-04-16 DIAGNOSIS — Z09 Encounter for follow-up examination after completed treatment for conditions other than malignant neoplasm: Secondary | ICD-10-CM

## 2012-04-16 LAB — CBC WITH DIFFERENTIAL/PLATELET
Basophils Absolute: 0 10*3/uL (ref 0.0–0.1)
HCT: 32.4 % — ABNORMAL LOW (ref 36.0–46.0)
Lymphocytes Relative: 31 % (ref 12–46)
Lymphs Abs: 2 10*3/uL (ref 0.7–4.0)
Monocytes Absolute: 0.5 10*3/uL (ref 0.1–1.0)
Neutro Abs: 4 10*3/uL (ref 1.7–7.7)
RBC: 3.4 MIL/uL — ABNORMAL LOW (ref 3.87–5.11)
RDW: 16.1 % — ABNORMAL HIGH (ref 11.5–15.5)
WBC: 6.5 10*3/uL (ref 4.0–10.5)

## 2012-04-16 LAB — GLUCOSE, CAPILLARY
Glucose-Capillary: 125 mg/dL — ABNORMAL HIGH (ref 70–99)
Glucose-Capillary: 99 mg/dL (ref 70–99)

## 2012-04-16 MED ORDER — FENTANYL CITRATE 0.05 MG/ML IJ SOLN
50.0000 ug | INTRAMUSCULAR | Status: DC | PRN
  Administered 2012-04-16 – 2012-04-17 (×12): 50 ug via INTRAVENOUS
  Filled 2012-04-16 (×12): qty 2

## 2012-04-16 NOTE — Progress Notes (Signed)
Attempted morning abg but was unsuccessful.  Pt refuses any more attempts.  RN notified.

## 2012-04-16 NOTE — Progress Notes (Signed)
Name: [name] [name] DOB: Mar 16, 1946 MRN: [id number] PCP: Katy Apo, MD ADMIT DATE: 04/15/2012 LOS: 1 Referred by Dr Johna Sheriff Cardiologist: Dr Anne Fu  Date of consult: 04/15/2012  PCCM  NOTE  HPI 67 year old morbidly obese- Body mass index is 53.03 kg/(m^2). female with multiple medical problems underwent Laparoscopic Roux-en-Y gastric bypass on 04/15/2012. This was an elective procedure for obesity. Postoperatively she could not be extubated presumably due to retention of general anesthesia. Therefore she has been admitted to the intensive care unit for acute postoperative respiratory failure. 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 Postoperatively acute respiratory failure    Subjective/Overnight/Interval History: Ready for extubation   Vital Signs: Temp:  [97.7 F (36.5 C)-98.8 F (37.1 C)] 98.8 F (37.1 C) (01/21 0400) Pulse Rate:  [60-96] 92  (01/21 0800) Resp:  [11-24] 15  (01/21 0800) BP: (96-175)/(57-87) 125/73 mmHg (01/21 0800) SpO2:  [95 %-100 %] 100 % (01/21 0800) FiO2 (%):  [30 %-100 %] 30 % (01/21 0800) Weight:  [131.316 kg (289 lb 8 oz)-135.8 kg (299 lb 6.2 oz)] 135.8 kg (299 lb 6.2 oz) (01/20 1745) I/O last 3 completed shifts: In: 4533 [I.V.:4533] Out: 1650 [Urine:1325; Drains:325]  Physical Examination: General: Morbidly obese female now on PSV and looking ready for extubation Neuro:  Awake, oriented. Follow commands  HEENT:  Obese neck, orally  intubated  Neck:    Pulse equal and reactive to light Cardiovascular:  Rate 66 in atrial flutter with normal blood pressure. Murmur not otherwise specified Lungs:  No specific crackles, decreased in bases. Excellent V/t Abdomen:  Obese, status post surgery Musculoskeletal:  Chronic DJD Skin:  He is intact Extremities: No cyanosis no clubbing no edema  Ventilator settings: Vent Mode:  [-] CPAP FiO2 (%):  [30 %-100 %] 30 % Set Rate:  [24 bmp] 24 bmp Vt Set:  [420 mL] 420 mL PEEP:  [5 cmH20] 5 cmH20 Pressure Support:  [5 cmH20-18 cmH20] 5 cmH20 Plateau Pressure:  [24 cmH20-31 cmH20] 25 cmH20  Radiology:  PCXR ETT good position. Lower volume film when c/w prior film, vasc congestion vs element of edema, but also a little under penetrated.     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 Has passed SBT.  P: Extubate (staff note: wil need qhs bipap, snoring post extubation and likely has OSA/OHV) Minimize sedation pulm hygiene   Full doseAnticoagulations as soon as possible (note she had refused prophylactic IVC filter prior to surgery) DVT proph dose lovenox since 04/15/2012 per [facility]   CARDIAC No results found for this basename: TROPONINI:5,PROBNP:5 in the last 168 hours  A: Baseline chronic atrial flutter and diastolic heart failure not otherwise specified P: Lopressor when necessary BNP  NEUROLOGIC A:   - Baseline depression and anxiety - Postop due to pain postoperatively - She is at risk for ICU  delirium due to pain, surgery and psychiatry issues. Now awake and oriented. MS should support extubation  P: - restart psychiatry drugs soon as possible -d/c sedating meds  INFECTIOUS DISEASE  Lab 04/16/12 0335 04/09/12 1015  WBC 6.5 5.7  PROCALCITON -- --  LATICACIDVEN -- --   A:  No evidence of acute infection P: Monitor  I/O last 3 completed shifts: In: 4533 [I.V.:4533] Out: 1650 [Urine:1325; Drains:325]  ELECTROLYTES and rENAL  Lab 04/16/12 0335 04/09/12 1015  NA -- 136  K -- 3.6  CL -- 98  CO2 -- 25  BUN -- 32*  CREATININE -- 1.74*  CALCIUM -- 8.8  MG 2.0 --  PHOS 3.1 --   A:  Stage III chronic kidney disease at the baseline. Baseline creatinine around 1.7-2 mg P: Monitor, ensure adequate hydration    HEMATOLOGIC  Lab 04/16/12 0335 04/15/12 2058 04/15/12 1008 04/09/12 1015  HGB 10.5* 11.1* -- 12.7  HCT 32.4* 34.3* -- 38.7  PLT 243 -- -- 315  INR -- -- 0.98 1.57*  APTT -- -- -- 32   A:  At risk foranemia of critical illness P: - PRBC for hgb </= 6.9gm%    - exceptions are   -  if ACS susepcted/confirmed then transfuse for hgb </= 8.0gm%,  or    -  If septic shock first 24h and scvo2 < 70% then transfuse for hgb </= 9.0gm%   - active bleeding with hemodynamic instability, then transfuse regardless of hemoglobin value   At at all times try to transfuse 1 unit prbc as possible with exception of active hemorrhage    GASTROINTESTINAL  Lab 04/15/12 1008 04/09/12 1015  AST -- 31  ALT -- 25  ALKPHOS -- 109  BILITOT -- 0.6  PROT -- 7.6  ALBUMIN -- 3.5  INR 0.98 1.57*  AMYLASE -- --  LIPASE -- --   A:  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 I have discussed with resident/NP and other care providers such as pharmacist, RN and RRT.  In addition,  I personally evaluated patient and elicited key findings of acute respiratory failure post op. Extubted but noticed to be snoring post op. She likely has OSA/OHV; will start nocturnal biopap.  Rest per NP/medical resident whose note is outlined above and that I agree with     Dr. Kalman Shan, M.D., [facility].C.P Pulmonary and Critical Care Medicine Staff Physician [facility] Pager: [phone], If no answer or between  15:00h - 7:00h: call [phone]  04/16/2012 12:13 PM

## 2012-04-16 NOTE — Progress Notes (Signed)
Patient ID: AHNNA DUNGAN, female   DOB: 05-Apr-1945, 67 y.o.   MRN: [id number] 1 Day Post-Op  Subjective: Patient had decreased level of consciousness and poor respiratory effort postoperatively yesterday and was left intubated and ventilated overnight. This morning she is very alert on the ventilator, responsive and answering questions and once the endotracheal tube out. On questioning she is having some abdominal pain but not severe. No shortness of breath.  Objective: Vital signs in last 24 hours: Temp:  [97.7 F (36.5 C)-98.8 F (37.1 C)] 98.8 F (37.1 C) (01/21 0400) Pulse Rate:  [60-96] 79  (01/21 0700) Resp:  [11-24] 24  (01/21 0700) BP: (96-175)/(57-87) 115/72 mmHg (01/21 0700) SpO2:  [95 %-100 %] 100 % (01/21 0700) FiO2 (%):  [30 %-100 %] 30 % (01/21 0413) Weight:  [289 lb 8 oz (131.316 kg)-299 lb 6.2 oz (135.8 kg)] 299 lb 6.2 oz (135.8 kg) (01/20 1745)    Intake/Output from previous day: 01/20 0701 - 01/21 0700 In: 4533 [I.V.:4533] Out: 1560 [Urine:1325; Drains:235] Intake/Output this shift:    General appearance: alert, cooperative and intbated Resp: clear to auscultation bilaterally GI: abnormal findings:  mild tenderness in the upper abdomen Incision/Wound: incisions clean and dry. JP drainage is watery sero-sanguinous  Lab Results:   Basename 04/16/12 0335 04/15/12 2058  WBC 6.5 --  HGB 10.5* 11.1*  HCT 32.4* 34.3*  PLT 243 --   BMET No results found for this basename: NA:2,K:2,CL:2,CO2:2,GLUCOSE:2,BUN:2,CREATININE:2,CALCIUM:2 in the last 72 hours  CBG (last 3)   Basename 04/16/12 0332 04/15/12 2316 04/15/12 2020  GLUCAP 125* 173* 205*      Studies/Results: Dg Chest Port 1 View  04/15/2012  *RADIOLOGY REPORT*  Clinical Data: Respiratory failure.  PORTABLE CHEST - 1 VIEW  Comparison: 03/07/2012  Findings: Endotracheal tube is 2.7 cm above the carina.  Low lung volumes with enlargement of the cardiac silhouette.  Prominent interstitial lung markings  may represent low lung volumes versus mild edema. There are patchy densities in the left lower lung and left hilum.  IMPRESSION: Endotracheal tube is appropriately positioned above the carina.  Low lung volumes with patchy densities in the left lung.  Findings could be related to atelectasis versus consolidation.   Original Report Authenticated By: Richarda Overlie, M.D.     Anti-infectives: Anti-infectives     Start     Dose/Rate Route Frequency Ordered Stop   04/15/12 0940   cefOXitin (MEFOXIN) 2 g in dextrose 5 % 50 mL IVPB        2 g 100 mL/hr over 30 Minutes Intravenous On call to O.R. 04/15/12 0940 04/15/12 1119          Assessment/Plan: s/p Procedure(s): LAPAROSCOPIC ROUX-EN-Y GASTRIC BYPASS WITH UPPER ENDOSCOPY UPPER GI ENDOSCOPY Postoperative respiratory insufficiency. She is very alert this morning and I expect will be extubated shortly. Abdominal exam is appropriate in JP drainage clear and minimal bleeding. She appears stable. I am going to defer her gastrograph and swallow until tomorrow and observe today in the step down unit.   LOS: 1 day    Crit Obremski T 04/16/2012

## 2012-04-16 NOTE — Progress Notes (Signed)
[id number]/Shauntae Reitman, RN, BSN, CCM:  CHART REVIEWED AND UPDATED.  Next chart review due on [id number]. NO DISCHARGE NEEDS PRESENT AT THIS TIME. CASE MANAGEMENT [phone] 

## 2012-04-16 NOTE — Procedures (Signed)
Extubation Procedure Note  Patient Details:   Name: [name] [name] DOB: 1945-10-15 MRN: [id number]   Airway Documentation:     Evaluation  O2 sats: stable throughout Complications: No apparent complications Patient did tolerate procedure well. Bilateral Breath Sounds: Diminished   Yes Pt extubated to 4 LPM nasal cannula with SPO2 100%, HR 95, and RR 17.  Excellent vocalization.  Kyra Leyland M 04/16/2012, 8:46 AM

## 2012-04-16 NOTE — Progress Notes (Signed)
VASCULAR LAB PRELIMINARY  PRELIMINARY  PRELIMINARY  PRELIMINARY  Bilateral lower extremity venous duplex completed.    Preliminary report:  Bilateral:  No evidence of DVT, superficial thrombosis, or Baker's Cyst.   Gardy Montanari, RVS 04/16/2012, 8:39 AM

## 2012-04-16 NOTE — Progress Notes (Signed)
Patient admitted to intensive care unit postoperatively secondary to acute postoperative respiratory failure.  Patient extubated this am, tolerated well.  Patient is alert and orient, O2 saturation is 100% on 4Lnc, vital signs are stable.  Patient has some abdominal discomfort that is relieved with prn medication .  Patient is instructed on incentive spirometer usage with return demonstration.  Patient is educated on the importance of ambulating.  Patient is wearing compression hose while in bed.  Discharge instructions given to patient for review, will go over prior to discharge.    Quenton Fetter, RN

## 2012-04-17 ENCOUNTER — Inpatient Hospital Stay (HOSPITAL_COMMUNITY): Payer: Federal, State, Local not specified - PPO

## 2012-04-17 LAB — GLUCOSE, CAPILLARY
Glucose-Capillary: 80 mg/dL (ref 70–99)
Glucose-Capillary: 94 mg/dL (ref 70–99)
Glucose-Capillary: 112 mg/dL — ABNORMAL HIGH (ref 70–99)

## 2012-04-17 LAB — CBC WITH DIFFERENTIAL/PLATELET
Basophils Absolute: 0 10*3/uL (ref 0.0–0.1)
Eosinophils Relative: 2 % (ref 0–5)
HCT: 33.7 % — ABNORMAL LOW (ref 36.0–46.0)
Lymphocytes Relative: 27 % (ref 12–46)
Lymphs Abs: 2.1 10*3/uL (ref 0.7–4.0)
MCV: 98 fL (ref 78.0–100.0)
Monocytes Absolute: 0.7 10*3/uL (ref 0.1–1.0)
Monocytes Relative: 10 % (ref 3–12)
RDW: 16.4 % — ABNORMAL HIGH (ref 11.5–15.5)
WBC: 7.7 10*3/uL (ref 4.0–10.5)

## 2012-04-17 LAB — BASIC METABOLIC PANEL
BUN: 25 mg/dL — ABNORMAL HIGH (ref 6–23)
CO2: 25 mEq/L (ref 19–32)
Chloride: 111 mEq/L (ref 96–112)
Glucose, Bld: 99 mg/dL (ref 70–99)
Potassium: 4.1 mEq/L (ref 3.5–5.1)

## 2012-04-17 MED ORDER — DIPHENHYDRAMINE HCL 50 MG/ML IJ SOLN
25.0000 mg | Freq: Once | INTRAMUSCULAR | Status: AC
  Administered 2012-04-17: 25 mg via INTRAVENOUS
  Filled 2012-04-17: qty 1

## 2012-04-17 MED ORDER — DIPHENHYDRAMINE HCL 50 MG/ML IJ SOLN
25.0000 mg | Freq: Four times a day (QID) | INTRAMUSCULAR | Status: DC | PRN
  Administered 2012-04-18 (×2): 25 mg via INTRAVENOUS
  Filled 2012-04-17 (×2): qty 1

## 2012-04-17 MED ORDER — IOHEXOL 300 MG/ML  SOLN: 50.0000 mL | Freq: Once | INTRAMUSCULAR | Status: AC | PRN

## 2012-04-17 MED ORDER — MORPHINE SULFATE 2 MG/ML IJ SOLN
2.0000 mg | INTRAMUSCULAR | Status: DC | PRN
  Administered 2012-04-17 (×2): 2 mg via INTRAVENOUS
  Administered 2012-04-18: 4 mg via INTRAVENOUS
  Administered 2012-04-18 (×2): 2 mg via INTRAVENOUS
  Filled 2012-04-17 (×4): qty 1
  Filled 2012-04-17: qty 2

## 2012-04-17 NOTE — Progress Notes (Signed)
Patient ID: [name] [name], female   DOB: [birth year], 67 y.o.   MRN: [id number] 2 Days Post-Op  Subjective: Thirsty, wants her study done so she can drink water. Minimal abd pain, no nausea.  Has been OOB  Objective: Vital signs in last 24 hours: Temp:  [98.5 F (36.9 C)-100.2 F (37.9 C)] 98.8 F (37.1 C) (01/22 0400) Pulse Rate:  [85-116] 105  (01/22 0435) Resp:  [10-21] 19  (01/22 0435) BP: (109-138)/(56-97) 112/68 mmHg (01/22 0435) SpO2:  [95 %-100 %] 100 % (01/22 0435) FiO2 (%):  [30 %] 30 % (01/21 0800) Weight:  [302 lb 4 oz (137.1 kg)] 302 lb 4 oz (137.1 kg) (01/22 0500)    Intake/Output from previous day: 01/21 0701 - 01/22 0700 In: 1834 [I.V.:1824; IV Piggyback:10] Out: 1535 [Urine:1380; Drains:155] Intake/Output this shift:    General appearance: alert, cooperative and no distress Resp: clear to auscultation bilaterally GI: Minimal tenderness in epigastrium with out guarding Incision/Wound: Clean and dry.  JP clear serosanguinous  Lab Results:   Basename 04/17/12 0358 04/16/12 1745 04/16/12 0335  WBC 7.7 -- 6.5  HGB 10.5* 11.1* --  HCT 33.7* 35.4* --  PLT 252 -- 243   BMET  Basename 04/17/12 0358  NA 144  K 4.1  CL 111  CO2 25  GLUCOSE 99  BUN 25*  CREATININE 1.46*  CALCIUM 7.5*     Studies/Results: Dg Chest Port 1 View  04/16/2012  *RADIOLOGY REPORT*  Clinical Data: Ventilator patient.  Endotracheal tube positioning.  PORTABLE CHEST - 1 VIEW  Comparison: 04/15/2012  Findings: The patient's mandible and facial tissues obscure the lung apices, particularly on the left.  The patient is rotated to the left on today's exam, resulting in reduced diagnostic sensitivity and specificity.   Endotracheal tube tip is 2.3 cm above the carina.  Mild cardiomegaly is present along with low lung volumes.  There is vascular crowding and I cannot exclude mild edema.  No blunting of the costophrenic angles noted.  IMPRESSION:  1.  Endotracheal tube tip 2.3 cm above the  carina. 2. Enlarged cardiopericardial silhouette. 3.  Low lung volumes, leftward rotation, and to the patient's facial tissues obscuring the lung apices reduce diagnostic sensitivity and specificity. 4.  Indistinct vasculature with patchy perihilar opacities, some of which is due to vascular crowding, but some of which may be due to edema or less likely atypical pneumonia.   Original Report Authenticated By: Gaylyn Rong, M.D.    Dg Chest Port 1 View  04/15/2012  *RADIOLOGY REPORT*  Clinical Data: Respiratory failure.  PORTABLE CHEST - 1 VIEW  Comparison: 03/07/2012  Findings: Endotracheal tube is 2.7 cm above the carina.  Low lung volumes with enlargement of the cardiac silhouette.  Prominent interstitial lung markings may represent low lung volumes versus mild edema. There are patchy densities in the left lower lung and left hilum.  IMPRESSION: Endotracheal tube is appropriately positioned above the carina.  Low lung volumes with patchy densities in the left lung.  Findings could be related to atelectasis versus consolidation.   Original Report Authenticated By: Richarda Overlie, M.D.     Anti-infectives: Anti-infectives     Start     Dose/Rate Route Frequency Ordered Stop   04/15/12 0940   cefOXitin (MEFOXIN) 2 g in dextrose 5 % 50 mL IVPB        2 g 100 mL/hr over 30 Minutes Intravenous On call to O.R. 04/15/12 0940 04/15/12 1119  Assessment/Plan: s/p Procedure(s): LAPAROSCOPIC ROUX-EN-Y GASTRIC BYPASS WITH UPPER ENDOSCOPY UPPER GI ENDOSCOPY Post op VDRF, resolved Doing well IDDM- good control on SSI Mild elevated creatinine, known CRI.  Good UOP, follow lab Hx DVT, on Lovenox For gastrograffin swallow this AM Transfer to floor   LOS: 2 days    Adonijah Baena T 04/17/2012

## 2012-04-17 NOTE — Progress Notes (Signed)
Placed pt on cpap for rest.  Pt was not able to tolerate the full face mask as ordered.  Pt stated she could not stand it and that she couldn't breathe with it on.  Attempted to adjust cpap pressure to pt comfort.  Pt on nasal cpap 10cm h2o.  Pt is tolerating ok for now.  RN aware.  Sterile water added to max fill line of humidity chamber.  Hr 99, sats 99%.

## 2012-04-17 NOTE — Progress Notes (Signed)
Patient is alert and oriented, denies any nausea or vomiting.  Patient has minimal abdominal pain that is relieved with prn medication.  Patients vital signs are stable.  Patient is encouraged to use incentive spirometry, instructed on the importance of usage. Patient able to reach 700 on incentive spirometry.  Patient has been up to the chair x 2.  Spoke with Rn about importance of ambulating patient in hallway and continued incentive use.  Plan also discussed with patient.  Patient scheduled for UGI this am and transfer to floor.  Will follow up with patient after test is performed.    Quenton Fetter, Rn

## 2012-04-17 NOTE — Progress Notes (Signed)
eLink Physician-Brief Progress Note Patient Name: [name] [name] DOB: July 06, 1945 MRN: [id number]  Date of Service  04/17/2012   HPI/Events of Note   Insomnia  eICU Interventions  One time order for benadryl 25 mg IV   Intervention Category Minor Interventions: Routine modifications to care plan (e.g. PRN medications for pain, fever)  DETERDING,ELIZABETH 04/17/2012, 1:16 AM

## 2012-04-17 NOTE — Progress Notes (Addendum)
Name: [name] [name] DOB: Dec 13, 1945 MRN: [id number] PCP: Katy Apo, MD ADMIT DATE: 04/15/2012 LOS: 2 Referred by Dr Johna Sheriff Cardiologist: Dr Anne Fu  Date of consult: 04/15/2012  PCCM  NOTE  HPI 67 year old morbidly obese- Body mass index is 53.54 kg/(m^2). female with multiple medical problems underwent Laparoscopic Roux-en-Y gastric bypass on 04/15/2012. This was an elective procedure for obesity. Postoperatively she could not be extubated presumably due to retention of general anesthesia. Therefore she has been admitted to the intensive care unit for acute postoperative respiratory failure. 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 Postoperatively acute respiratory failure    Subjective/Overnight/Interval History: Wants to drink   Vital Signs: Temp:  [98.5 F (36.9 C)-100.2 F (37.9 C)] 99 F (37.2 C) (01/22 0800) Pulse Rate:  [85-116] 96  (01/22 0810) Resp:  [10-21] 18  (01/22 0810) BP: (109-138)/(56-86) 122/70 mmHg (01/22 0745) SpO2:  [95 %-100 %] 100 % (01/22 0810) Weight:  [137.1 kg (302 lb 4 oz)] 137.1 kg (302 lb 4 oz) (01/22 0500) I/O last 3 completed shifts: In: 3442 [I.V.:3432; IV Piggyback:10] Out: 2400 [Urine:2130; Drains:270] Room air  Physical Examination: General: Morbidly obese female in no distress  Neuro:  Awake, oriented. Follow commands  HEENT:  Obese neck, orally intubated  Neck:    Pulse equal and reactive to light Cardiovascular:atrial flutter  with normal blood pressure. Murmur not otherwise specified Lungs:  No specific crackles, decreased in bases Abdomen:  Obese, status post surgery Musculoskeletal:  Chronic DJD Skin:  He is intact Extremities: No cyanosis no clubbing no edema     Radiology:  PCXR ETT good position. Lower volume film when c/w prior film, vasc congestion vs element of edema, but also a little under penetrated.     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 Obesity  coordinator has already arranged referral for titration test  - Admitted on 04/15/2012 acute postoperative respiratory failure due to retention of anesthesia.  Extubated 1/21. Refused BIPAP, states she felt claustrophobic Plan pulm hygiene  Full doseAnticoagulations as soon as possible (note she had refused prophylactic IVC filter prior to surgery) DVT proph dose lovenox since 04/15/2012 per [facility] Auto titrate CPAP at HS.   CARDIAC No results found for this basename: TROPONINI:5,PROBNP:5 in the last 168 hours A: Baseline chronic atrial flutter and diastolic heart failure not otherwise specified P: Lopressor when necessary BNP  NEUROLOGIC A:   - Baseline depression and anxiety - Postop due to pain postoperatively -ms is intact P: - restart psychiatry drugs soon as possible  INFECTIOUS DISEASE  Lab 04/17/12 0358 04/16/12 0335  WBC 7.7 6.5  PROCALCITON -- --  LATICACIDVEN -- --  A:  No evidence of acute infection P: Monitor  I/O last 3 completed shifts: In: 3442 [I.V.:3432; IV Piggyback:10] Out: 2400 [Urine:2130; Drains:270]  ELECTROLYTES and rENAL  Lab  04/17/12 0358 04/16/12 0335  NA 144 --  K 4.1 --  CL 111 --  CO2 25 --  BUN 25* --  CREATININE 1.46* --  CALCIUM 7.5* --  MG 2.1 2.0  PHOS 2.8 3.1   A:  Stage III chronic kidney disease at the baseline. Baseline creatinine around 1.7-2 mg hgb holding at BL.  P: Monitor, ensure adequate hydration    HEMATOLOGIC  Lab 04/17/12 0358 04/16/12 1745 04/16/12 0335 04/15/12 2058 04/15/12 1008  HGB 10.5* 11.1* 10.5* 11.1* --  HCT 33.7* 35.4* 32.4* 34.3* --  PLT 252 -- 243 -- --  INR -- -- -- -- 0.98  APTT -- -- -- -- --   A:  At risk foranemia of critical illness P: - PRBC for hgb </= 6.9gm%    - exceptions are   -  if ACS susepcted/confirmed then transfuse for hgb </= 8.0gm%,  or    -  If septic shock first 24h and scvo2 < 70% then transfuse for hgb </= 9.0gm%   - active bleeding with hemodynamic instability, then transfuse regardless of hemoglobin value   At at all times try to transfuse 1 unit prbc as possible with exception of active hemorrhage    GASTROINTESTINAL  Lab 04/15/12 1008  AST --  ALT --  ALKPHOS --  BILITOT --  PROT --  ALBUMIN --  INR 0.98  AMYLASE --  LIPASE --   A:  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 PCCM will s/o.    04/17/2012 9:15 AM        STAFF NOTE: I, Dr Lavinia Sharps have personally reviewed patient's available data, including medical history, events of note, physical examination and test results as part of my evaluation. I have discussed with resident/NP and other care providers such as pharmacist, RN and RRT.  In addition,  I personally evaluated patient and elicited key findings of  postoperative respiratory failure now extubated for 24 hours and doing well. She refuses to wear her non-invasive mechanical ventilation CPAP mask overnight for presumed sleep apnea. She says she had a sleep study done recently and the results are presumably normal she does not have a sleep Dr. This point pulmonary critical care medicine will sign off. Strongly recommend that she enroll with a sleep specialist at followup.  Rest per NP/medical resident whose note is outlined above and that I agree with   Dr. Kalman Shan, M.D., [facility].C.P Pulmonary and Critical Care Medicine Staff Physician [facility] Pager: [phone], If no answer or between  15:00h - 7:00h: call [phone]  04/17/2012 10:09 AM

## 2012-04-17 NOTE — Progress Notes (Signed)
Pt refused BIPAP.

## 2012-04-17 NOTE — Progress Notes (Addendum)
Patient with several episodes of apnea overnight.  Critical care recomended patient wearing CPAP while sleeping, pt refused to wear CPAP last pm.  Patient remembered having sleep study performed but could not recall being diagnosed with sleep apnea.  Sleep centered notified,  media was not transferred over to interpretations.  Sleep center will send referral  for CPAP to Dr.Martin and arrange set up of CPAP by [facility].  Patient informed of results of sleep study and educated on sleep apnea and the importance as well as benefits of wearing CPAP.  Patient tearful over diagnosis, [name] [name] from sleep center provided further education and comfort to patient.   Dr. Johna Sheriff notified of results from sleep study and UGI.  Patient advanced to POD # 1 diet.   Quenton Fetter, Rn

## 2012-04-18 LAB — GLUCOSE, CAPILLARY
Glucose-Capillary: 121 mg/dL — ABNORMAL HIGH (ref 70–99)
Glucose-Capillary: 105 mg/dL — ABNORMAL HIGH (ref 70–99)

## 2012-04-18 LAB — CBC
MCHC: 31.1 g/dL (ref 30.0–36.0)
Platelets: 268 10*3/uL (ref 150–400)
RDW: 16.3 % — ABNORMAL HIGH (ref 11.5–15.5)

## 2012-04-18 LAB — BASIC METABOLIC PANEL
BUN: 19 mg/dL (ref 6–23)
GFR calc Af Amer: 49 mL/min — ABNORMAL LOW (ref >90)
GFR calc non Af Amer: 42 mL/min — ABNORMAL LOW (ref >90)
Potassium: 4.5 mEq/L (ref 3.5–5.1)
Sodium: 143 mEq/L (ref 135–145)

## 2012-04-18 LAB — MAGNESIUM: Magnesium: 2.1 mg/dL (ref 1.5–2.5)

## 2012-04-18 LAB — TRIGLYCERIDES: Triglycerides: 230 mg/dL — ABNORMAL HIGH (ref <150)

## 2012-04-18 MED ORDER — INSULIN GLARGINE 100 UNIT/ML ~~LOC~~ SOLN: 40.0000 [IU] | Freq: Two times a day (BID) | SUBCUTANEOUS | Status: DC

## 2012-04-18 MED ORDER — OXYCODONE-ACETAMINOPHEN 5-325 MG/5ML PO SOLN: 5.0000 mL | ORAL | Status: DC | PRN

## 2012-04-18 MED ORDER — OXYCODONE-ACETAMINOPHEN 5-325 MG/5ML PO SOLN
5.0000 mg | ORAL | Status: DC | PRN
  Administered 2012-04-18: 10 mL via ORAL
  Filled 2012-04-18: qty 10

## 2012-04-18 NOTE — Discharge Summary (Signed)
Patient ID: [name] [name] [id number] 66 y.o. 1945/05/08  04/15/2012  Discharge date and time: 04/18/2012   Admitting Physician: Glenna Fellows T  Discharge Physician: Glenna Fellows T  Admission Diagnoses: morbid obesity  Discharge Diagnoses: morbid obesity  Operations: Procedure(s): LAPAROSCOPIC ROUX-EN-Y GASTRIC BYPASS WITH UPPER ENDOSCOPY UPPER GI ENDOSCOPY  Admission Condition: fair  Discharged Condition: fair  Indication for Admission: patient is a 67 year old female with Heims-standing progressive morbid obesity unresponsive to medical management he presents at BMI of 51. She has multiple comorbidities including insulin-dependent diabetes mellitus, obstructive sleep apnea, hypertension and others. After an extensive preoperative evaluation and workup detailed elsewhere she is electively admitted for laparoscopic Roux-en-Y gastric bypass.  Hospital Course: patient was admitted on the morning of her procedure and underwent an uneventful laparoscopic Roux-en-Y gastric bypass. Postoperatively she was very slow to wake up after anesthesia and remained ventilated in the ICU overnight. The following morning she was alert and stable and was extubated without difficulty. From this point her postoperative course was smooth. On the second postoperative day she had a Gastrografin swallow showing no leak or obstruction. Her pain quickly improved. She was able to tolerate water the second postoperative day. On the third postoperative day she is alert and comfortable. Vital signs are all stable with a heart rate about 100. She has mild chronic renal insufficiency and postop creatinine was 1.4 this will be rechecked prior to discharge. Abdomen is soft and nontender. Wounds are healing well.  Consults: critical care medicine for ventilator management    Disposition: Home  Patient Instructions:   [name] [name], [name] [name]  Home Medication Instructions WUJ:[id number]   Printed on:04/18/12 0856    Medication Information                    promethazine (PHENERGAN) 25 MG tablet Take 25 mg by mouth every 4 (four) hours as needed. For nausea and vomiting           furosemide (LASIX) 80 MG tablet Take 80 mg by mouth 2 (two) times daily.            sertraline (ZOLOFT) 50 MG tablet Take 50 mg by mouth every morning.            meclizine (ANTIVERT) 25 MG tablet Take 25 mg by mouth 3 (three) times daily as needed. vertigo           metoprolol (TOPROL-XL) 100 MG 24 hr tablet Take 100 mg by mouth every morning.            insulin aspart (NOVOLOG) 100 UNIT/ML injection Inject 30 Units into the skin 2 (two) times daily. Per sliding scale.           zolpidem (AMBIEN) 10 MG tablet Take 10 mg by mouth at bedtime.            gabapentin (NEURONTIN) 600 MG tablet Take 600 mg by mouth 2 (two) times daily.            potassium chloride (K-DUR) 10 MEQ tablet Take 20 mEq by mouth 2 (two) times daily.            diltiazem (CARDIZEM CD) 120 MG 24 hr capsule Take 120 mg by mouth every morning.            febuxostat (ULORIC) 40 MG tablet Take 80 mg by mouth every morning.            chlorhexidine (HIBICLENS) 4 % external liquid Apply 60 mLs (4 application total)  topically daily.           warfarin (COUMADIN) 5 MG tablet Take 5 mg by mouth daily.            B-D INS SYR ULTRAFINE 1CC/30G 30G X 1/2" 1 ML MISC            simvastatin (ZOCOR) 40 MG tablet Take 40 mg by mouth at bedtime.            pantoprazole (PROTONIX) 40 MG tablet Take 40 mg by mouth daily.           oxyCODONE-acetaminophen (PERCOCET) 10-650 MG per tablet Take 1 tablet by mouth every 6 (six) hours as needed. PT STATES DOES NOT HAVE ANYMORE           mupirocin cream (BACTROBAN) 2 % Apply 1 application topically 2 (two) times daily.           diltiazem (TIAZAC) 120 MG 24 hr capsule PT STATES DOES NOT TAKE - IS NOT ON MED LIST           insulin glargine (LANTUS) 100 UNIT/ML injection Inject 40 Units into the skin  2 (two) times daily.           oxyCODONE-acetaminophen (ROXICET) 5-325 MG/5ML solution Take 5-10 mLs by mouth every 4 (four) hours as needed for pain.             Activity: activity as tolerated Diet: protein shakes Wound Care: none needed  Follow-up:  With Dr. Johna [name] in 2 weeks.  Signed: Mariella Saa MD, FACS  04/18/2012, 8:56 AM

## 2012-04-18 NOTE — Progress Notes (Signed)
Patient alert and oriented, in good spirits, had a successful night wearing CPAP machine.  Patients vital signs are stable, passing gas, no bowel movements.  Patient tolerated water over night, no nausea or vomiting.  Patient has minimal abdominal pain that is being relieved with prn medication. Patient is using incentive spirometry and wearing compression hose while in bed.   Ambulated patient in hallway approximately 180 ft.  Patient has follow up appointment with [facility] and [facility].  Belt program and hospital support group discussed with patient. The following discharge instructions listed below reviewed with patient.     Quenton Fetter, Rn  GASTRIC BYPASS/SLEEVE DISCHARGE INSTRUCTIONS  Drs. Fredrik Rigger, Hoxworth, Wilson, and Georgiana Call if you have any problems.   Call [phone] and ask for the surgeon on call.    If you need immediate assistance come to the ER at [facility]. Tell the ER personnel that you are a new post-op gastric bypass patient. Signs and symptoms to report:   Severe vomiting or nausea. If you cannot tolerate clear liquids for longer than 1 day, you need to call your surgeon.    Abdominal pain which does not get better after taking your pain medication   Fever greater than 101 F degree   Difficulty breathing   Chest pain    Redness, swelling, drainage, or foul odor at incision sites    If your incisions open or pull apart   Swelling or pain in calf (lower leg)   Diarrhea, frequent watery, uncontrolled bowel movements.   Constipation, (no bowel movements for 3 days) if this occurs, Take Milk of Magnesia, 2 tablespoons by mouth, 3 times a day for 2 days if needed.  Call your doctor if constipation continues. Stop taking Milk of Magnesia once you have had a bowel movement. You may also use Miralax according to the label instructions.   Anything you consider "abnormal for you".   Normal side effects after Surgery:   Unable to sleep at night or concentrate   Irritability   Being tearful (crying) or depressed   These are common complaints, possibly related to your anesthesia, stress of surgery and change in lifestyle, that usually go away a few weeks after surgery.  If these feelings continue, call your medical doctor.  Wound Care You may have surgical glue, steri-strips, or staples over your incisions after surgery.  Surgical glue:  Looks like a clear film over your incisions and will wear off gradually. Steri-strips: Strips of tape over your incisions. You may notice a yellowish color on the skin underneath the steri-strips. This is a substance used to make the steri-strips stick better. Do not pull the steri-strips off - let them fall off.  Staples: Cherlynn Polo may be removed before you leave the hospital. If you go home with staples, call [facility] [phone]) for an appointment with your surgeon's nurse to have staples removed in 7 - 10 days. Showering: You may shower two days after your surgery unless otherwise instructed by your surgeon. Wash gently around wounds with warm soapy water, rinse well, and gently pat dry.  If you have a drain, you may need someone to hold this while you shower. Avoid tub baths until staples are removed and incisions are healed.    Medications   Medications should be liquid or crushed if larger than the size of a dime.  Extended release pills should not be crushed.   Depending on the size and number of medications you take, you may need  to stagger/change the time you take your medications so that you do not over-fill your pouch.    Make sure you follow-up with your primary care physician to make medication adjustments needed during rapid weight loss and life-style adjustment.   If you are diabetic, follow up with the doctor that prescribes your diabetes medication(s) within one week after surgery and check your blood sugar regularly.   Do not drive while taking narcotics!   Do not take acetaminophen (Tylenol) and  Roxicet or Lortab Elixir at the same time since these pain medications contain acetaminophen.  Diet at home: (First 2 Weeks) You will see the nutritionist two weeks after your surgery. She will advance your diet if you are tolerating liquids well. Once at home, if you have severe vomiting or nausea and cannot tolerate clear liquids lasting longer than 1 day, call your surgeon.  Begin high protein shake 2 ounces every 3 hours, 5 - 6 times per day.  Gradually increase the amount you drink as tolerated.  You may find it easier to slowly sip shakes throughout the day.  It is important to get your proteins in first.   Protein Shake   Drink at least 2 ounces of shake 5-6 times per day   Each serving of protein shakes should have a minimum of 15 grams of protein and no more than 5 grams of carbohydrate    Increase the amount of protein shake you drink as tolerated   Protein powder may be added to fluids such as non-fat milk or Lactaid milk (limit to 20 grams added protein powder per serving   The initial goal is to drink at least 8 ounces of protein shake/drink per day (or as directed by the nutritionist). Some examples of protein shakes are [employer], [employer]illard[employer], EAS Edge HP, and [employer]jury. Hydration   Gradually increase the amount of water and other liquids as tolerated (See Acceptable Fluids)   Gradually increase the amount of protein shake as tolerated     Sip fluids slowly and throughout the day   May use Sugar substitutes, use sparingly (limit to 6 - 8 packets per day). Your fluid goal is 64 ounces of fluid daily. It may take a few weeks to build up to this.         32 oz (or more) should be clear liquids and 32 oz (or more) should be full liquids.         Liquids should not contain sugar, caffeine, or carbonation! Acceptable Fluids Clear Liquids:   Water or Sugar-free flavored water, Fruit H2O   Decaffeinated coffee or tea (sugar-free)   Crystal Lite, Wyler's Lite, Minute Maid Lite     Sugar-free Jell-O   Bouillon or broth   Sugar-free Popsicle:   *Less than 20 calories each; Limit 1 per day   Full Liquids:              Protein Shakes/Drinks + 2 choices per day of other full liquids shown below.    Other full liquids must be: No more than 12 grams of Carbs per serving,  No more than 3 grams of Fat per serving   Strained low-fat cream soup   Non-Fat milk   Fat-free Lactaid Milk   Sugar-free yogurt (Dannon Lite & Fit) Vitamins and Minerals (Start 1 day after surgery unless otherwise directed)   2 Chewable Multivitamin / Multimineral Supplement (i.e. Centrum for Adults)   Chewable Calcium Citrate with Vitamin D-3. Take 1500 mg each  day.           (Example: 3 Chewable Calcium Plus 600 with Vitamin D-3 can be found at [facility])         Vitamin B-12, 350 - 500 micrograms (oral tablet) each day   Do not mix multivitamins containing iron with calcium supplements; take 2 hours   apart   Do not substitute Tums (calcium carbonate) for your calcium   Menstruating women and those at risk for anemia may need extra iron. Talk with your doctor to see if you need additional iron.    If you need extra iron:  Total daily Iron recommendations (including Vitamins) = 50 - 100 mg Iron/day Do not stop taking or change any vitamins or minerals until you talk to your nutritionist or surgeon. Your nutritionist and / or physician must approve all vitamin and mineral supplements. Exercise For maximum success, begin exercising as soon as your doctor recommends. Make sure your physician approves any physical activity.   Depending on fitness level, begin with a simple walking program   Walk 5-15 minutes each day, 7 days per week.    Slowly increase until you are walking 30-45 minutes per day   Consider joining our BELT program. [phone] or email belt@[employer] [email or web address] Things to remember:    You may have sexual relations when you feel comfortable. It is VERY important for female patients to use a reliable  birth control method. Fertility often increases after surgery. Do not get pregnant for at least 18 months.   It is very important to keep all follow up appointments with your surgeon, nutritionist, primary care physician, and behavioral health practitioner. After the first year, please follow up with your bariatric surgeon at least once a year in order to maintain best weight loss results.  [facility]: [phone] [facility]: [phone]   Free counseling is available for you and your family through collaboration between [facility] and [place]. Please call [phone] and leave a message.    Consider purchasing a medical alert bracelet that says you had gastric bypass surgery.    The [facility] has a free Bariatric Surgery Support Group that meets monthly, the 3rd Thursday, 6 pm, Classroom #1, [place]. You may register online at [email or web address], but registration is not necessary. Select Classes and Support Groups, Bariatric Surgery, or Call [phone]   Do not return to work or drive until cleared by your surgeon   Use your CPAP when sleeping if applicable   Do not lift anything greater than ten pounds for at least two weeks

## 2012-04-18 NOTE — Progress Notes (Signed)
In to see patient prior to discharge. Pt is tolerating diet and her pain is controlled. Encouraged patient to gradually increase the amount that she walks each day. Introduced patient to [employer] from [facility], who will coordinate the education and set-up of C-Pap at the patient's home this afternoon. Patient verbalized understanding and agreement.

## 2012-04-26 ENCOUNTER — Ambulatory Visit (INDEPENDENT_AMBULATORY_CARE_PROVIDER_SITE_OTHER): Payer: Federal, State, Local not specified - PPO | Admitting: General Surgery

## 2012-04-26 ENCOUNTER — Ambulatory Visit (INDEPENDENT_AMBULATORY_CARE_PROVIDER_SITE_OTHER): Payer: Federal, State, Local not specified - PPO | Admitting: Surgery

## 2012-04-26 NOTE — Progress Notes (Signed)
History: Patient returns for her first postoperative visit approaching 2 weeks following laparoscopic Roux-en-Y gastric bypass for morbid obesity and multiple comorbidities including severe insulin-dependent diabetes mellitus. She actually is getting along very well. She denies any significant pain or nausea or fever. Tolerating her protein shakes without difficulty area and she is down to 20 units of Wyer-acting insulin per day versus 80 preoperatively. Her blood sugars are running 90-110.  Exam: BP 134/86  Pulse 71  Temp 97.2 F (36.2 C) (Temporal)  Resp 18  Ht 5' 3.5" (1.613 m)  Wt 281 lb 12.8 oz (127.824 kg)  BMI 49.14 kg/m2 General: Appears well Abdomen: Soft and nontender. Wounds healing without infection  Assessment and plan: Doing very well early after gastric bypass without complication identified. She is to see the dietitian next week. I encourage her to monitor her protein intake and gradually increase her activity. Return in one month.

## 2012-04-30 ENCOUNTER — Encounter: Payer: Federal, State, Local not specified - PPO | Attending: General Surgery | Admitting: *Deleted

## 2012-04-30 DIAGNOSIS — Z01818 Encounter for other preprocedural examination: Secondary | ICD-10-CM | POA: Insufficient documentation

## 2012-04-30 DIAGNOSIS — Z713 Dietary counseling and surveillance: Secondary | ICD-10-CM | POA: Insufficient documentation

## 2012-05-02 ENCOUNTER — Encounter: Payer: Self-pay | Admitting: *Deleted

## 2012-05-02 NOTE — Progress Notes (Signed)
Bariatric Class:  Appt start time: 1600 end time:  1700.  2 Week Post-Operative Nutrition Class  Patient was seen on 04/30/12 for Post-[facility]ative [facility]   Surgery date: 04/15/12  Surgery type: RYGB  Start weight at [facility]: 291.6 lb (03/02/12)   Last weight: 293.1 lbs  Weight today: 273.0 lbs Weight change: 20.1 lbs Total weight lost: 20.1 lbs BMI: 47.6 kg/m^2  Goal weight: 180 lbs  % goal met: 18%  The following the learning objectives were met by the patient during this course:  Identifies Phase 3A (Soft, High Proteins) Dietary Goals and will begin from 2 weeks post-operatively to 2 months post-operatively  Identifies appropriate sources of fluids and proteins   States protein recommendations and appropriate sources post-operatively  Identifies the need for appropriate texture modifications, mastication, and bite sizes when consuming solids  Identifies appropriate multivitamin and calcium sources post-operatively  Describes the need for physical activity post-operatively and will follow MD recommendations  States when to call healthcare provider regarding medication questions or post-operative complications  Handouts given during class include:  Phase 3A: Soft, High Protein Diet Handout  Follow-Up Plan: Patient will follow-up at [facility] in 6 weeks for 2 months post-op nutrition visit for diet advancement per MD.

## 2012-05-02 NOTE — Patient Instructions (Signed)
Patient to follow Phase 3A-Soft, High Protein Diet and follow-up at [facility] in 6 weeks for 2 months post-op nutrition visit for diet advancement. 

## 2012-05-08 ENCOUNTER — Telehealth (INDEPENDENT_AMBULATORY_CARE_PROVIDER_SITE_OTHER): Payer: Self-pay

## 2012-05-08 MED ORDER — SUCRALFATE 1 GM/10ML PO SUSP: 1.0000 g | Freq: Four times a day (QID) | ORAL | Status: DC

## 2012-05-08 MED ORDER — SUCRALFATE 1 G PO TABS: 1.0000 g | ORAL_TABLET | Freq: Four times a day (QID) | ORAL | Status: DC

## 2012-05-08 NOTE — Telephone Encounter (Signed)
The pt called in reporting mid abdominal pain that hurts her stomach when she eats and drinks .  She vomits a little but not every time she eats.  She has no fever.  She moves her bowels every 2 days.  It has been hurting since her surgery 1/20.  She was seen 1/31 and it looked like she was doing well but she said she still hurt.  She thought it was from surgery.  She wonders if it's her gallbladder.  She is taking a daily Nexium.  The nutritionist advanced her diet to soft.  I spoke to Dr Andrey Campanile and he said have her stay on liquids for 2 to 3 days.  Call in Carafate 1 gm po qid.  Have her call back if worsens or new symptoms arise.  I told her to stay on liquids.  I told her about the RX I am going to call in.  She is worried about the price and wants to know how much it cost.  If it's expensive then she wants something else.  She told me to let her know.  I said I don't know the price but will ask the pharmacy.  I called the [facility] pharmacy [phone] and they can provide pills for $2.80 vs the liquid $33.71.  I got the ok from Dr Andrey Campanile to give pills.  She will get 10 days worth.  The pt is notified.  I told her to call if her symptoms worsen, change and or don't get better.

## 2012-05-13 ENCOUNTER — Ambulatory Visit: Payer: Federal, State, Local not specified - PPO | Admitting: Infectious Disease

## 2012-06-07 ENCOUNTER — Encounter (INDEPENDENT_AMBULATORY_CARE_PROVIDER_SITE_OTHER): Payer: Self-pay | Admitting: General Surgery

## 2012-06-07 ENCOUNTER — Ambulatory Visit (INDEPENDENT_AMBULATORY_CARE_PROVIDER_SITE_OTHER): Payer: Federal, State, Local not specified - PPO | Admitting: General Surgery

## 2012-06-07 DIAGNOSIS — K912 Postsurgical malabsorption, not elsewhere classified: Secondary | ICD-10-CM

## 2012-06-07 DIAGNOSIS — Z09 Encounter for follow-up examination after completed treatment for conditions other than malignant neoplasm: Secondary | ICD-10-CM

## 2012-06-07 NOTE — Progress Notes (Signed)
Chief complaint: Followup gastric bypass and abdominal pain  History: Patient returns for her three-month routine followup status post laparoscopic Roux-en-Y gastric bypass. She reports she's been having quite a bit of difficulty with abdominal pain. She describes a raw or burning type pain in her mid abdomen and epigastrium with medications and with food. She has been having some episodes of vomiting about twice per week. She's actually very pleased with her weight loss and is feeling well and now regard. Her insulin requirements our way down from 80 units of Lantus to 9 daily. She is having some right-sided back pain with motion. Almonds are normal. No fever or chills. No melena or hematemesis.  Exam: BP 112/68  Pulse 79  Temp(Src) 97.3 F (36.3 C) (Temporal)  Ht 5' 3.5" (1.613 m)  Wt 255 lb 6.4 oz (115.849 kg)  BMI 44.53 kg/m2  SpO2 94% Weight loss 42 pounds General: Appears well in no distress Skin: Warm and dry Lungs: Clear equal breath sounds bilaterally Cardiac: Regular rate and rhythm. No edema. Abdomen: Mild epigastric tenderness. No guarding. No masses or organomegaly.  Assessment and plan: Treatment status post laparoscopic right gastric bypass. She's having some significant abdominal discomfort postprandial and episodes of vomiting. I think we need to rule out a marginal ulcer and I recommended repair seeds with upper endoscopy. I did refill her Roxicet 250 cc. Get routine lab work today. I will call her after her endoscopy and we have an appointment for her in 3 months at the latest.

## 2012-06-07 NOTE — Patient Instructions (Addendum)
I will call you after I have the results of your endoscopy.

## 2012-06-11 ENCOUNTER — Encounter: Payer: Federal, State, Local not specified - PPO | Attending: General Surgery | Admitting: *Deleted

## 2012-06-11 ENCOUNTER — Encounter: Payer: Self-pay | Admitting: *Deleted

## 2012-06-11 DIAGNOSIS — Z01818 Encounter for other preprocedural examination: Secondary | ICD-10-CM | POA: Insufficient documentation

## 2012-06-11 DIAGNOSIS — Z713 Dietary counseling and surveillance: Secondary | ICD-10-CM | POA: Insufficient documentation

## 2012-06-11 NOTE — Patient Instructions (Addendum)
Goals:  Follow Phase 3B: High Protein + Non-Starchy Vegetables  Wait until after endoscopy to start  Eat 3-6 small meals/snacks, every 3-5 hrs  Increase lean protein foods to meet 60-80g goal  Increase fluid intake to 64oz +  Avoid drinking 15 minutes before, during and 30 minutes after eating  Aim for >30 min of physical activity daily  Avoid acidic foods (tomatoes, Pepsi, etc)

## 2012-06-11 NOTE — Progress Notes (Signed)
  Follow-up visit:  8 Weeks Post-Operative RYGB Surgery  Medical Nutrition Therapy:  Appt start time: 1030 end time:  1100.  Primary concerns today: Post-operative Bariatric Surgery Nutrition Management. [name] [name] returns today with additional 18.5 lb wt loss. Reports being "very sick" and has been eating "anything I want". Eating corned beef, fruit, pb crackers, spaghetti, and a little bread. Has been drinking 8 oz Pepsi daily. Reports she is having an EGD soon for possible ulcer.  Surgery date: 04/15/12  Surgery type: RYGB  Start weight at [facility]: 291.6 lb (03/02/12)  Pre-Op Class weight: 293.1 (03/28/12)   Weight today: 254.5 lbs Weight change: 18.5 lbs Total weight lost: 38.6 lbs (since 03/28/12)  Goal weight: 180 lbs  % goal met: 35%  TANITA  BODY COMP RESULTS  06/11/12   BMI (kg/m^2) 44.4   Fat Mass (lbs) 133.0   Fat Free Mass (lbs) 121.5   Total Body Water (lbs) 89.0   24-hr recall: B (AM):  Protein shake (made w/ 4 oz water) OR greek yogurt  (5-12g) Snk (AM):  1/4 lg banana  L (AM): NONE - no appetite Snk (4-5 PM): NONE D (PM): 1/4 hotdog (2-4g) Snk (PM): 1/2 a Mr Lorie Apley (didn't make her feel good)  Fluid intake: Water, 4 oz Pepsi (advised to d/c), protein shake = 40-50 oz Estimated total protein intake: < 20 g  Medications:  Lantus decreased to 15 units BID from 40 u BID. Not taking carafate d/t taste or percocet. Reports stomach pain with larger pills that she cannot crush. Supplementation:  Was taking until ran out recently   CBG monitoring: 1-2 times/week Average CBG per patient: 120-135 mg; a few 160s  Lab Results  Component Value Date   HGBA1C 9.8* 02/15/2012   Using straws: Yes, but stopped when she got gas Drinking while eating: Yes, sometimes normal sips Hair loss: No  Carbonated beverages: Yes, 8 oz Pepsi daily  N/V/D/C: Some diarrhea; noticed some bleeding from rectum - likely hemorrhoids Dumping syndrome: No  Recent physical activity:  None d/t stomach  pain  Progress Towards Goal(s):  In progress.  Handouts given during visit include:  Phase 3B: High Protein + Non-Starchy Vegetables  Samples given during visit include:   Premier Protein Shake Lot: [id number]; Exp: 02/01/13 - 3 ea Lot: [id number]; Exp: 04/08/13 - 1 ea   Nutritional Diagnosis:  NI-5.7.1 Inadequate protein intake related to stomach pain from possible ulcer as evidenced by patient reported food recall and intake of < 35% post-op protein needs.    Intervention:  Nutrition education.  Monitoring/Evaluation:  Dietary intake, exercise, lap band fills, and body weight. Follow up in 1 months for 3 month post-op visit.

## 2012-06-12 ENCOUNTER — Encounter (HOSPITAL_COMMUNITY): Payer: Self-pay | Admitting: *Deleted

## 2012-06-17 ENCOUNTER — Ambulatory Visit (HOSPITAL_COMMUNITY)
Admission: RE | Admit: 2012-06-17 | Payer: Federal, State, Local not specified - PPO | Source: Ambulatory Visit | Admitting: Surgery

## 2012-06-17 SURGERY — EGD (ESOPHAGOGASTRODUODENOSCOPY)
Anesthesia: Monitor Anesthesia Care

## 2012-06-20 ENCOUNTER — Ambulatory Visit (INDEPENDENT_AMBULATORY_CARE_PROVIDER_SITE_OTHER): Payer: Federal, State, Local not specified - PPO | Admitting: General Surgery

## 2012-07-01 ENCOUNTER — Encounter (HOSPITAL_COMMUNITY): Admission: RE | Payer: Self-pay | Source: Ambulatory Visit

## 2012-07-01 ENCOUNTER — Ambulatory Visit (HOSPITAL_COMMUNITY)
Admission: RE | Admit: 2012-07-01 | Payer: Federal, State, Local not specified - PPO | Source: Ambulatory Visit | Admitting: Surgery

## 2012-07-01 SURGERY — EGD (ESOPHAGOGASTRODUODENOSCOPY)
Anesthesia: Moderate Sedation

## 2012-07-02 ENCOUNTER — Telehealth (INDEPENDENT_AMBULATORY_CARE_PROVIDER_SITE_OTHER): Payer: Self-pay | Admitting: General Surgery

## 2012-07-02 NOTE — Telephone Encounter (Signed)
Spoke with patient she states she did not have surgery on 07/01/12

## 2012-07-09 ENCOUNTER — Ambulatory Visit: Payer: Federal, State, Local not specified - PPO | Admitting: *Deleted

## 2012-07-23 ENCOUNTER — Encounter: Payer: Self-pay | Admitting: *Deleted

## 2012-07-23 ENCOUNTER — Encounter: Payer: Federal, State, Local not specified - PPO | Attending: General Surgery | Admitting: *Deleted

## 2012-07-23 DIAGNOSIS — Z01818 Encounter for other preprocedural examination: Secondary | ICD-10-CM | POA: Insufficient documentation

## 2012-07-23 DIAGNOSIS — Z713 Dietary counseling and surveillance: Secondary | ICD-10-CM | POA: Insufficient documentation

## 2012-07-23 NOTE — Patient Instructions (Addendum)
Goals:  Follow Phase 3B: High Protein + Non-Starchy Vegetables  Eat 3-6 small meals/snacks  NO MEAL SKIPPING  Increase lean protein foods to meet 60-80g goal  Increase fluid intake to 64oz +  Add 15 grams of carbohydrate (fruit, whole grains) with meals  1 choice from carb side (left side) of yellow card  Always have a protein choice with carbs  Avoid drinking 15 minutes before, during and 30 minutes after eating  Aim for >30 min of physical activity daily  Resume calcium citrate 3 times a day (500 mg each time)

## 2012-07-23 NOTE — Progress Notes (Signed)
  Follow-up visit:  12 Weeks Post-Operative RYGB Surgery  Medical Nutrition Therapy:  Appt start time:  800   End time:  830.  Primary concerns today: Post-operative Bariatric Surgery Nutrition Management. [name] [name] returns today with additional 17.0 lb wt loss.  Has decreased soda intake to 8 oz Pepsi 2 times a week from daily. High CHO intake continues at times in the form of fruit, etc. Overall doing well. Stomach pain has resolved.   Surgery date: 04/15/12  Surgery type: RYGB  Start weight at [facility]: 291.6 lb (03/02/12)  Pre-Op Class weight: 293.1 (03/28/12)   Weight today:  237.5 lbs Weight change: 17.0 lbs Total weight lost: 55.6 lbs (since 03/28/12)  Goal weight: 180 lbs  % goal met: 49%  TANITA  BODY COMP RESULTS  06/11/12 07/23/12   BMI (kg/m^2) 44.4 41.4   Fat Mass (lbs) 133.0 120.5   Fat Free Mass (lbs) 121.5 117.0   Total Body Water (lbs) 89.0 85.5   24-hr recall: B (AM):  Protein shake made w/ 8 oz water (25g)  Snk (AM):  1 boiled egg (6g) L (AM): NONE - no appetite Snk (4-5 PM): NONE D (PM): 1-2 oz fried chicken leg, 2 Tbsp white rice w/ gravy (15g) Snk (PM):  1 whole grapefruit  Fluid intake: Water, protein shakes = 40-50 oz Estimated total protein intake:  40-45 g  Medications:  Lantus decreased to 9 units BID and Novolog to 4 u BID.  Supplementation:  Taking MVI (Geritol) and B12 regularly; Calcium only 1x/day - cannot remember other 2 doses  CBG monitoring: Not checking Average CBG per patient:  90s-120 mg; a few high BG and knows when d/t blurry vision  Lab Results  Component Value Date   HGBA1C 9.8* 02/15/2012   Using straws: No Drinking while eating: Yes, baby sips Hair loss: No  Carbonated beverages:  Yes; has decreased to 8 oz two times/week (from daily)  N/V/D/C:  Nausea some mornings; thinks it is d/t being hungry or the "smell of certain foods"  Dumping syndrome:  No  Recent physical activity:  None but plans to start walking treadmill. Increased ADLs  (cleaning, etc)  Progress Towards Goal(s):  In progress.   Nutritional Diagnosis:  NI-5.7.1 Inadequate protein intake related to stomach pain from possible ulcer as evidenced by patient reported food recall and intake of < 35% post-op protein needs. - RESOLVED  NI-5.7.1 Inadequate protein intake related to lack of appetite s/p RYGB surgery as evidenced by patient reported food recall and intake of <75% post-op protein needs.    Intervention:  Nutrition education.  Monitoring/Evaluation:  Dietary intake, exercise, and body weight. Follow up in 3 months for 6 month post-op visit.

## 2012-08-05 ENCOUNTER — Emergency Department (HOSPITAL_COMMUNITY)
Admission: EM | Admit: 2012-08-05 | Discharge: 2012-08-05 | Disposition: A | Discharge status: Home / Self Care | Payer: Federal, State, Local not specified - PPO | Attending: Emergency Medicine | Admitting: Emergency Medicine

## 2012-08-05 ENCOUNTER — Encounter (HOSPITAL_COMMUNITY): Payer: Self-pay | Admitting: *Deleted

## 2012-08-05 DIAGNOSIS — Z9889 Other specified postprocedural states: Secondary | ICD-10-CM | POA: Insufficient documentation

## 2012-08-05 DIAGNOSIS — Z794 Long term (current) use of insulin: Secondary | ICD-10-CM | POA: Insufficient documentation

## 2012-08-05 DIAGNOSIS — M79609 Pain in unspecified limb: Secondary | ICD-10-CM | POA: Insufficient documentation

## 2012-08-05 DIAGNOSIS — Z86711 Personal history of pulmonary embolism: Secondary | ICD-10-CM | POA: Insufficient documentation

## 2012-08-05 DIAGNOSIS — Z8611 Personal history of tuberculosis: Secondary | ICD-10-CM | POA: Insufficient documentation

## 2012-08-05 DIAGNOSIS — Z8739 Personal history of other diseases of the musculoskeletal system and connective tissue: Secondary | ICD-10-CM | POA: Insufficient documentation

## 2012-08-05 DIAGNOSIS — N183 Chronic kidney disease, stage 3 unspecified: Secondary | ICD-10-CM | POA: Insufficient documentation

## 2012-08-05 DIAGNOSIS — E119 Type 2 diabetes mellitus without complications: Secondary | ICD-10-CM | POA: Insufficient documentation

## 2012-08-05 DIAGNOSIS — I509 Heart failure, unspecified: Secondary | ICD-10-CM | POA: Insufficient documentation

## 2012-08-05 DIAGNOSIS — G8929 Other chronic pain: Secondary | ICD-10-CM | POA: Insufficient documentation

## 2012-08-05 DIAGNOSIS — I129 Hypertensive chronic kidney disease with stage 1 through stage 4 chronic kidney disease, or unspecified chronic kidney disease: Secondary | ICD-10-CM | POA: Insufficient documentation

## 2012-08-05 DIAGNOSIS — Z862 Personal history of diseases of the blood and blood-forming organs and certain disorders involving the immune mechanism: Secondary | ICD-10-CM | POA: Insufficient documentation

## 2012-08-05 DIAGNOSIS — Z79899 Other long term (current) drug therapy: Secondary | ICD-10-CM | POA: Insufficient documentation

## 2012-08-05 DIAGNOSIS — F411 Generalized anxiety disorder: Secondary | ICD-10-CM | POA: Insufficient documentation

## 2012-08-05 DIAGNOSIS — Z8701 Personal history of pneumonia (recurrent): Secondary | ICD-10-CM | POA: Insufficient documentation

## 2012-08-05 DIAGNOSIS — Z7901 Long term (current) use of anticoagulants: Secondary | ICD-10-CM | POA: Insufficient documentation

## 2012-08-05 DIAGNOSIS — K219 Gastro-esophageal reflux disease without esophagitis: Secondary | ICD-10-CM | POA: Insufficient documentation

## 2012-08-05 DIAGNOSIS — M549 Dorsalgia, unspecified: Secondary | ICD-10-CM | POA: Insufficient documentation

## 2012-08-05 DIAGNOSIS — Z87891 Personal history of nicotine dependence: Secondary | ICD-10-CM | POA: Insufficient documentation

## 2012-08-05 DIAGNOSIS — R011 Cardiac murmur, unspecified: Secondary | ICD-10-CM | POA: Insufficient documentation

## 2012-08-05 LAB — PROTIME-INR
INR: 8.17 (ref 0.00–1.49)
Prothrombin Time: 62.1 seconds — ABNORMAL HIGH (ref 11.6–15.2)

## 2012-08-05 MED ORDER — KETOROLAC TROMETHAMINE 30 MG/ML IJ SOLN
30.0000 mg | Freq: Once | INTRAMUSCULAR | Status: AC
  Administered 2012-08-05: 30 mg via INTRAMUSCULAR
  Filled 2012-08-05: qty 1

## 2012-08-05 MED ORDER — OXYCODONE-ACETAMINOPHEN 5-325 MG PO TABS: 1.0000 | ORAL_TABLET | Freq: Four times a day (QID) | ORAL | Status: DC | PRN

## 2012-08-05 MED ORDER — HYDROMORPHONE HCL PF 1 MG/ML IJ SOLN
0.5000 mg | Freq: Once | INTRAMUSCULAR | Status: AC
  Administered 2012-08-05: 0.5 mg via INTRAMUSCULAR
  Filled 2012-08-05: qty 1

## 2012-08-05 MED ORDER — OXYCODONE-ACETAMINOPHEN 5-325 MG PO TABS
1.0000 | ORAL_TABLET | Freq: Once | ORAL | Status: AC
  Administered 2012-08-05: 1 via ORAL
  Filled 2012-08-05: qty 1

## 2012-08-05 MED ORDER — PHYTONADIONE 5 MG PO TABS
5.0000 mg | ORAL_TABLET | Freq: Once | ORAL | Status: AC
  Administered 2012-08-05: 5 mg via ORAL
  Filled 2012-08-05: qty 1

## 2012-08-05 NOTE — ED Notes (Addendum)
Pt refused gown in to much  pian.

## 2012-08-05 NOTE — ED Notes (Addendum)
EMS reports pt has history of rt rotator cuff problems, tonight pain is worse. During triage pt noted to fall asleep with a reported pain level of 10/10

## 2012-08-05 NOTE — ED Provider Notes (Signed)
History     CSN: [id number]  Arrival date & time 08/05/12  0035   First MD Initiated Contact with Patient 08/05/12 0112      Chief Complaint  Patient presents with  . Shoulder Pain    rt    (Consider location/radiation/quality/duration/timing/severity/associated sxs/prior treatment) HPI Comments: Ms. Leinweber is a morbidly obese, female, who uses a cane in her right hand.  Since she had knee surgery 6-8 months ago, noticed tonight that her right upper arm was painful.  Denies any fall, numbness, or tingling.  She has full range of motion of the fingers, wrist, and elbow.  Patient is a 67 y.o. female presenting with shoulder pain. The history is provided by the patient.  Shoulder Pain This is a new problem. The current episode started today. The problem occurs constantly. The problem has been unchanged. Pertinent negatives include no chills, fever, joint swelling, neck pain, numbness, rash or weakness. The symptoms are aggravated by exertion. She has tried nothing for the symptoms. The treatment provided no relief.    Past Medical History  Diagnosis Date  . Heart murmur   . Shortness of breath   . Blood dyscrasia   . Mental disorder   . Hypertension   . CHF (congestive heart failure)   . Blood transfusion   . GERD (gastroesophageal reflux disease)   . Anxiety   . Diabetes mellitus   . Depression   . Pulmonary embolism APPROX 5 YRS AGO    on lifelong coumadin  . Pneumonia     hx of bronchitis  . Chronic back pain   . Gout     hx of left foot  . Shingles     RT SIDE OF FACE  . Sciatic nerve pain     right leg  . Dysrhythmia      sees Dr. Anne Fu atrial fib  . 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 Chronic kidney disease, stage III (moderate)   . RENAL INSUFFICIENCY, CHRONIC 03/19/2010  . Arthritis     legs,back  . Anemia     Past Surgical History  Procedure Laterality Date  . Vascular surgery      vein removed in legs  . Dilation and curettage of uterus    .  Joint replacement      both knees  . Tubal ligation    . Shoulder surgery      right shoulder I& D secondary to septic joint  . Laser ablation      "for dysrhythmia's approx. 4 years ago"  . Mammary  11/06/2011  . Breast reduction surgery  11/06/2011    Procedure: MAMMARY REDUCTION  (BREAST);  Surgeon: Louisa Second, MD;  Location: [facility];  Service: Plastics;  Laterality: Bilateral;  . Breath tek h pylori  03/08/2012    Procedure: BREATH TEK H PYLORI;  Surgeon: Valarie Merino, MD;  Location: [facility];  Service: General;  Laterality: N/A;  . 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 Upper gi endoscopy  04/15/2012    Procedure: UPPER GI ENDOSCOPY;  Surgeon: Mariella Saa, MD;  Location: [facility] ORS;  Service: General;;    No family history on file.  History  Substance Use Topics  . Smoking status: Former Games developer  . Smokeless tobacco: Never Used  . Alcohol Use: No  OB History   Grav Para Term Preterm Abortions TAB SAB Ect Mult Living                  Review of Systems  Constitutional: Negative for fever and chills.  HENT: Negative for neck pain and neck stiffness.   Musculoskeletal: Negative for joint swelling.  Skin: Negative for rash and wound.  Neurological: Negative for weakness and numbness.  Hematological: Negative for adenopathy.  All other systems reviewed and are negative.    Allergies  Other and Shellfish allergy  Home Medications   Current Outpatient Rx  Name  Route  Sig  Dispense  Refill  . B-D INS SYR ULTRAFINE 1CC/30G 30G X 1/2" 1 ML MISC               . diltiazem (CARDIZEM CD) 120 MG 24 hr capsule   Oral   Take 120 mg by mouth every morning.          . furosemide (LASIX) 80 MG tablet   Oral   Take 80 mg by mouth 2 (two) times daily.          Marland Kitchen gabapentin (NEURONTIN) 600 MG  tablet   Oral   Take 600 mg by mouth 2 (two) times daily.          . insulin aspart (NOVOLOG) 100 UNIT/ML injection   Subcutaneous   Inject 4 Units into the skin 2 (two) times daily. Per sliding scale.         . insulin glargine (LANTUS) 100 UNIT/ML injection   Subcutaneous   Inject 9 Units into the skin 2 (two) times daily.          . metoprolol (TOPROL-XL) 100 MG 24 hr tablet   Oral   Take 100 mg by mouth every morning.          . pantoprazole (PROTONIX) 40 MG tablet   Oral   Take 40 mg by mouth daily.         . potassium chloride (K-DUR) 10 MEQ tablet   Oral   Take 20 mEq by mouth 2 (two) times daily.          . sertraline (ZOLOFT) 50 MG tablet   Oral   Take 50 mg by mouth every morning.          . simvastatin (ZOCOR) 40 MG tablet   Oral   Take 40 mg by mouth at bedtime.          Marland Kitchen warfarin (COUMADIN) 5 MG tablet   Oral   Take 5 mg by mouth daily.          Marland Kitchen zolpidem (AMBIEN) 10 MG tablet   Oral   Take 10 mg by mouth at bedtime.          . meclizine (ANTIVERT) 25 MG tablet   Oral   Take 25 mg by mouth 3 (three) times daily as needed. vertigo         . oxyCODONE-acetaminophen (PERCOCET/ROXICET) 5-325 MG per tablet   Oral   Take 1-2 tablets by mouth every 6 (six) hours as needed for pain.   20 tablet   0   . promethazine (PHENERGAN) 25 MG tablet   Oral   Take 25 mg by mouth every 4 (four) hours as needed. For nausea and vomiting           BP 107/66  Pulse 78  Temp(Src) 98.1 F (36.7 C) (Oral)  SpO2 94%  Physical  Exam  Constitutional: She appears well-developed and well-nourished.  HENT:  Head: Normocephalic.  Eyes: Pupils are equal, round, and reactive to light.  Neck: Normal range of motion.  Cardiovascular: Regular rhythm.   Pulmonary/Chest: Effort normal and breath sounds normal.  Musculoskeletal: She exhibits tenderness. She exhibits no edema.       Right shoulder: She exhibits decreased range of motion and  tenderness. She exhibits no bony tenderness, no swelling, no effusion, no deformity, no pain, no spasm, normal pulse and normal strength.  Neurological: She is alert.  Skin: Skin is warm and dry.    ED Course  Procedures (including critical care time)  Labs Reviewed  PROTIME-INR - Abnormal; Notable for the following:    Prothrombin Time 62.1 (*)    INR 8.17 (*)    All other components within normal limits   No results found.   1. Warfarin-induced coagulopathy, initial encounter   2. Pain, upper extremity, right       MDM  Patient's INR found to be 8.1, 7, which is most likely, the cause of her discomfort.  She will be given, 5 mg of vitamin K by mouth.  Told to hold her Coumadin until it verified by her primary care physician in 2 days.  She will be given a prescription for Percocet for pain control, and discharge home with bleeding.  Precautions         Arman Filter, NP 08/05/12 0351  Arman Filter, NP 08/05/12 [phone]

## 2012-08-07 ENCOUNTER — Ambulatory Visit: Payer: Federal, State, Local not specified - PPO | Admitting: Dietician

## 2012-08-08 NOTE — ED Provider Notes (Signed)
Medical screening examination/treatment/procedure(s) were performed by non-physician practitioner and as supervising physician I was immediately available for consultation/collaboration.   Trevonne Nyland, MD 08/08/12 0731 

## 2012-09-05 ENCOUNTER — Encounter (INDEPENDENT_AMBULATORY_CARE_PROVIDER_SITE_OTHER): Payer: Self-pay | Admitting: General Surgery

## 2012-09-05 ENCOUNTER — Ambulatory Visit (INDEPENDENT_AMBULATORY_CARE_PROVIDER_SITE_OTHER): Payer: Federal, State, Local not specified - PPO | Admitting: General Surgery

## 2012-09-05 VITALS — BP 110/70 | HR 56 | Temp 98.2°F | Resp 12 | Ht 63.5 in | Wt 219.4 lb

## 2012-09-05 DIAGNOSIS — Z09 Encounter for follow-up examination after completed treatment for conditions other than malignant neoplasm: Secondary | ICD-10-CM

## 2012-09-05 DIAGNOSIS — Z9884 Bariatric surgery status: Secondary | ICD-10-CM

## 2012-09-05 DIAGNOSIS — K912 Postsurgical malabsorption, not elsewhere classified: Secondary | ICD-10-CM

## 2012-09-05 NOTE — Progress Notes (Signed)
Chief complaint: Followup gastric bypass  History: Patient returns for followup of laparoscopic Roux-en-Y gastric bypass performed 6 months ago with multiple comorbidities including insulin dependent diabetes mellitus, obstructive sleep apnea, hypertension. Her three-month visit she was having some epigastric pain and vomiting. We had scheduled her for upper endoscopy but she called and stated her symptoms were improved and she canceled the procedure. She states these problems have resolved. She is experiencing some flatulence but no abdominal pain or vomiting of significance. She overall feels much better than before surgery.  Exam: BP 110/70  Pulse 56  Temp(Src) 98.2 F (36.8 C) (Oral)  Resp 12  Ht 5' 3.5" (1.613 m)  Wt 219 lb 6 oz (99.508 kg)  BMI 38.25 kg/m2 A 36 pound weight loss from last visit, total weight loss 79 pounds General: Overweight well-appearing Skin: No rashes infection Lungs: Clear equal breath sounds Cardiac: No edema regular rate and rhythm Abdomen: Soft and nontender. No hernias.  Assessment and plan: Doing well following gastric bypass with good progressive weight loss and no complications identified. She is on markedly reduced doses of insulin. Still on the CPAP but feels like she is sleeping better at night. Hypertension current. Check lab work today. I encouraged her to increase her exercise a little bit. Return in 3 months.

## 2012-10-22 ENCOUNTER — Encounter: Payer: Federal, State, Local not specified - PPO | Attending: General Surgery | Admitting: *Deleted

## 2012-10-22 ENCOUNTER — Encounter: Payer: Self-pay | Admitting: *Deleted

## 2012-10-22 DIAGNOSIS — Z713 Dietary counseling and surveillance: Secondary | ICD-10-CM | POA: Insufficient documentation

## 2012-10-22 NOTE — Progress Notes (Signed)
  Follow-up visit:  6 Month Post-Operative RYGB Surgery  Medical Nutrition Therapy:  Appt start time:  915   End time:  945.  Primary concerns today: Post-operative Bariatric Surgery Nutrition Management. [name] [name] returns today with additional 28.5 lb wt loss (27 lbs of FAT MASS) since last visit.    Surgery date: 04/15/12  Surgery type: RYGB  Start weight at [facility]: 291.6 lb (03/02/12)  Pre-Op Class weight: 293.1 (03/28/12)   Weight today: 209 lbs Weight change: 28.5 lbs Total weight lost: 84.1 lbs (since 03/28/12)  Goal weight: 180 lbs  % goal met: 74%  TANITA  BODY COMP RESULTS  06/11/12 07/23/12 10/22/12   BMI (kg/m^2) 44.4 41.4 36.4   Fat Mass (lbs) 133.0 120.5 93.5   Fat Free Mass (lbs) 121.5 117.0 115.5   Total Body Water (lbs) 89.0 85.5 84.5   24-hr recall: B (AM):  Protein shake made w/ 8 oz water (25g)  Snk (AM): NONE L (AM): NONE - no appetite Snk (3 PM): Few lightly salted Lays potato chips or Fritos OR cherries or tomato D (PM): 1/2 pc [employer] chicken (short thigh), lemonade Snk (PM):  A few potato chips, 1 jolly rancher OR none  Fluid intake: Water, reg Snapple, protein shakes (8 oz) = 50 oz Estimated total protein intake:  40-45 g  Medications:  Novolog d/c'd.   Supplementation:  Taking MVI, Geritol, Calcium, and B12 regularly  CBG monitoring: Not checking Average CBG per patient:  90s-120 mg; a few high BGs and knows when d/t blurry vision  Lab Results  Component Value Date   HGBA1C 9.8* 02/15/2012   Using straws: No Drinking while eating: Yes, baby sips Hair loss:  Yes;   Carbonated beverages:  Yes; has decreased to 8 oz two times/week (from daily)  N/V/D/C:  Nausea some mornings; thinks it is d/t being hungry or the "smell of certain foods"  Dumping syndrome:  No  Recent physical activity:  None but plans to start walking treadmill. Increased ADLs (cleaning, etc)  Progress Towards Goal(s):  In progress.   Nutritional Diagnosis:  NI-5.7.1 Inadequate  protein intake related to stomach pain from possible ulcer as evidenced by patient reported food recall and intake of < 35% post-op protein needs. - RESOLVED  NI-5.7.1 Inadequate protein intake related to lack of appetite s/p RYGB surgery as evidenced by patient reported food recall and intake of <75% post-op protein needs.    Samples given during visit include:   Unjury Protein Powder Lot: [id number]; Exp: 09/15 - 2 ea Lot: [id number]; Exp: 09/15 - 3 ea  Intervention:  Nutrition education.  Monitoring/Evaluation:  Dietary intake, exercise, and body weight. Follow up in 3 months for 9 month post-op visit.

## 2012-10-22 NOTE — Patient Instructions (Addendum)
Goals:  Follow Phase 3B: High Protein + Non-Starchy Vegetables  Eat 3-6 small meals/snacks  NO MEAL SKIPPING  Increase lean protein foods to meet 60-80g goal  Increase fluid intake to 64oz +  Add 15 grams of carbohydrate (fruit, whole grains) with meals  1 choice from carb side (left side) of yellow card  Always have a protein choice with carbs  Avoid drinking 15 minutes before, during and 30 minutes after eating  Aim for >30 min of physical activity daily  Resume calcium citrate 3 times a day (500 mg each time)  TANITA  BODY COMP RESULTS  06/11/12 07/23/12 10/22/12   BMI (kg/m^2) 44.4 41.4 36.4   Fat Mass (lbs) 133.0 120.5 93.5   Fat Free Mass (lbs) 121.5 117.0 115.5   Total Body Water (lbs) 89.0 85.5 84.5

## 2012-11-28 ENCOUNTER — Ambulatory Visit (INDEPENDENT_AMBULATORY_CARE_PROVIDER_SITE_OTHER): Payer: Federal, State, Local not specified - PPO | Admitting: General Surgery

## 2012-12-30 ENCOUNTER — Other Ambulatory Visit: Payer: Self-pay

## 2012-12-30 DIAGNOSIS — Z1231 Encounter for screening mammogram for malignant neoplasm of breast: Secondary | ICD-10-CM

## 2012-12-30 DIAGNOSIS — Z9889 Other specified postprocedural states: Secondary | ICD-10-CM

## 2013-01-28 ENCOUNTER — Ambulatory Visit: Payer: Federal, State, Local not specified - PPO | Admitting: *Deleted

## 2013-02-03 ENCOUNTER — Ambulatory Visit: Payer: Federal, State, Local not specified - PPO | Admitting: *Deleted

## 2013-02-06 ENCOUNTER — Ambulatory Visit (INDEPENDENT_AMBULATORY_CARE_PROVIDER_SITE_OTHER): Payer: Medicare Other | Admitting: General Surgery

## 2013-02-06 ENCOUNTER — Encounter (INDEPENDENT_AMBULATORY_CARE_PROVIDER_SITE_OTHER): Payer: Self-pay | Admitting: General Surgery

## 2013-02-06 VITALS — BP 118/82 | Temp 97.2°F | Resp 16 | Ht 63.5 in | Wt 199.8 lb

## 2013-02-06 DIAGNOSIS — Z9884 Bariatric surgery status: Secondary | ICD-10-CM | POA: Insufficient documentation

## 2013-02-06 DIAGNOSIS — E119 Type 2 diabetes mellitus without complications: Secondary | ICD-10-CM

## 2013-02-06 DIAGNOSIS — K912 Postsurgical malabsorption, not elsewhere classified: Secondary | ICD-10-CM

## 2013-02-06 DIAGNOSIS — I10 Essential (primary) hypertension: Secondary | ICD-10-CM

## 2013-02-06 DIAGNOSIS — Z09 Encounter for follow-up examination after completed treatment for conditions other than malignant neoplasm: Secondary | ICD-10-CM

## 2013-02-06 NOTE — Progress Notes (Signed)
History: The patient returns to the office just over 9 months following laparoscopic Roux-en-Y gastric bypass for morbid obesity and multiple significant comorbidities including insulin-dependent diabetes mellitus, hypertension, osteoarthritis and sleep apnea. Overall she is doing very well. She has had for a good progressive weight loss. She is concerned about some excess skin folds in her thighs and abdomen area she reports she is still on insulin but down to 9 units which was taking over 100 units preoperatively. Her joint pain is much improved. She denies any serious abdominal pain or vomiting. She has occasional slight weakness and some balance issues..  Exam: BP 118/82  Temp(Src) 97.2 F (36.2 C) (Temporal)  Resp 16  Ht 5' 3.5" (1.613 m)  Wt 199 lb 12.8 oz (90.629 kg)  BMI 34.83 kg/m2 Total weight loss 97 pounds, 20 pound weight loss from last visit General: Appears well Skin: She has a vesicular rash over her left jaw with history of shingles in the past in this area. She states has been present for a month. Lungs: Clear equal breath sounds Cardiac: Regular and rhythm. No edema. Abdomen: Soft and nontender. No hernias.  Assessment and plan: Doing well following gastric bypass with very good weight loss. She has had significant improvement in her diabetes and chronic joint pain. Remains on CPAP hypertension he is current. She has not been exercising much and will review the importance of daily exercise. She has a treadmill at home and we'll try to do this on a daily basis. She's had a few sweets slip back into her diet and we counseled against having this in the house. We'll check routine lab work today. Return in 3 months.

## 2013-02-07 ENCOUNTER — Encounter: Payer: Medicare Other | Attending: General Surgery | Admitting: Dietician

## 2013-02-07 DIAGNOSIS — Z713 Dietary counseling and surveillance: Secondary | ICD-10-CM | POA: Insufficient documentation

## 2013-02-07 LAB — COMPREHENSIVE METABOLIC PANEL
ALT: 48 U/L — ABNORMAL HIGH (ref 0–35)
Albumin: 3.7 g/dL (ref 3.5–5.2)
Alkaline Phosphatase: 98 U/L (ref 39–117)
CO2: 26 mEq/L (ref 19–32)
Glucose, Bld: 77 mg/dL (ref 70–99)
Potassium: 3.7 mEq/L (ref 3.5–5.3)
Sodium: 143 mEq/L (ref 135–145)
Total Protein: 6.6 g/dL (ref 6.0–8.3)

## 2013-02-07 LAB — CBC WITH DIFFERENTIAL/PLATELET
Hemoglobin: 11.5 g/dL — ABNORMAL LOW (ref 12.0–15.0)
Lymphs Abs: 2.6 10*3/uL (ref 0.7–4.0)
Monocytes Relative: 5 % (ref 3–12)
Neutro Abs: 1.7 10*3/uL (ref 1.7–7.7)
Neutrophils Relative %: 37 % — ABNORMAL LOW (ref 43–77)
RBC: 3.78 MIL/uL — ABNORMAL LOW (ref 3.87–5.11)
WBC: 4.7 10*3/uL (ref 4.0–10.5)

## 2013-02-07 LAB — LIPID PANEL
HDL: 38 mg/dL — ABNORMAL LOW (ref >39)
LDL Cholesterol: 111 mg/dL — ABNORMAL HIGH (ref 0–99)
VLDL: 37 mg/dL (ref 0–40)

## 2013-02-07 LAB — VITAMIN B12: Vitamin B-12: >2000 pg/mL — ABNORMAL HIGH (ref 211–911)

## 2013-02-07 LAB — HEMOGLOBIN A1C: Hgb A1c MFr Bld: 5.8 % — ABNORMAL HIGH (ref <5.7)

## 2013-02-07 LAB — MAGNESIUM: Magnesium: 1.7 mg/dL (ref 1.5–2.5)

## 2013-02-07 LAB — IRON AND TIBC: UIBC: 224 ug/dL (ref 125–400)

## 2013-02-07 LAB — FOLATE: Folate: 6.6 ng/mL

## 2013-02-07 NOTE — Progress Notes (Signed)
  Follow-up visit:  10 Month Post-Operative RYGB Surgery  Medical Nutrition Therapy:  Appt start time:  1100   End time:  1130.  Primary concerns today: Post-operative Bariatric Surgery Nutrition Management. Asta returns today with additional 8.5 lb wt loss. Eating hard candies every day.   Surgery date: 04/15/12  Surgery type: RYGB  Start weight at [facility]: 291.6 lb (03/02/12)  Pre-Op Class weight: 293.1 (03/28/12)   Weight today: 200.5 lbs Weight change: 8.5 lbs Total weight lost: 92.6  lbs (since 03/28/12)  Goal weight: 160-180 lbs  % goal met: 70-82%  TANITA  BODY COMP RESULTS  06/11/12 07/23/12 10/22/12 02/07/13   BMI (kg/m^2) 44.4 41.4 36.4 35.0   Fat Mass (lbs) 133.0 120.5 93.5 91.0   Fat Free Mass (lbs) 121.5 117.0 115.5 109.5   Total Body Water (lbs) 89.0 85.5 84.5 80.0   24-hr recall: B (AM):  Scrambled egg and grits or banana Snk (AM): 10 butterscotch candy L (AM): NONE - no appetite Snk (3 PM): more candy OR rice and steak D (PM): 1/2 pc [employer] chicken (short thigh), cabbage or green beans Snk (PM):  Candy and bubble   Fluid intake: Water, regular coffee, pepsi = 50 oz Estimated total protein intake:  40-45 g  Medications:  Novolog d/c'd.   Supplementation:  Taking MVI, Geritol, Calcium, and B12 regularly  CBG monitoring: Not checking Average CBG per patient:  90s-120 mg; a few high BGs and knows when d/t blurry vision  Lab Results  Component Value Date   HGBA1C 9.8* 02/15/2012   Using straws: every once in awhile Drinking while eating: No Hair loss:  Yes;   Carbonated beverages:  Yes, Pepsi N/V/D/C:  Diarrhea sometimes probably from sweets Dumping syndrome:  No  Recent physical activity:  None but plans to start walking treadmill.  Progress Towards Goal(s):  In progress.   Nutritional Diagnosis:  NI-5.7.1 Inadequate protein intake related to stomach pain from possible ulcer as evidenced by patient reported food recall and intake of < 35% post-op protein  needs. - RESOLVED  NI-5.7.1 Inadequate protein intake related to lack of appetite s/p RYGB surgery as evidenced by patient reported food recall and intake of <75% post-op protein needs.     Intervention:  Nutrition education.  Handouts given during visit:  Phase IIIB High Protein + NS Vegetables  Phase IV Low fat, low carb solid food  15 g CHO Snacks  Monitoring/Evaluation:  Dietary intake, exercise, and body weight. Follow up in 3 months for 12-13 month post-op visit.

## 2013-02-07 NOTE — Patient Instructions (Addendum)
Goals:  Avoid eating candies, instead choose protein and non-starchy vegetables mostly  Follow Phase 3B: High Protein + Non-Starchy Vegetables  Eat 3-6 small meals/snacks  NO MEAL SKIPPING  Increase lean protein foods to meet 60-80g goal  Increase fluid intake to 64oz +  Add 15 grams of carbohydrate (fruit, whole grains) with meals  1 choice from carb side (left side) of yellow card  Always have a protein choice with carbs  Avoid drinking 15 minutes before, during and 30 minutes after eating  Aim for >30 min of physical activity daily  Resume calcium citrate 3 times a day (500 mg each time)  TANITA  BODY COMP RESULTS  06/11/12 07/23/12 10/22/12 02/07/13   BMI (kg/m^2) 44.4 41.4 36.4 35.0   Fat Mass (lbs) 133.0 120.5 93.5 91.0   Fat Free Mass (lbs) 121.5 117.0 115.5 109.5   Total Body Water (lbs) 89.0 85.5 84.5 80.0

## 2013-02-10 ENCOUNTER — Telehealth (INDEPENDENT_AMBULATORY_CARE_PROVIDER_SITE_OTHER): Payer: Self-pay

## 2013-02-10 NOTE — Telephone Encounter (Signed)
Lost EPIC connection -- called patient unable to leave a message.  Please let patient know labs all look okay, no abnormatlites.  Lipids are much better than before surgery.

## 2013-02-10 NOTE — Telephone Encounter (Signed)
Message copied by Maryan Puls on Mon Feb 10, 2013  1:55 PM ------      Message from: Glenna Fellows T      Created: Fri Feb 07, 2013  6:50 PM       Please call patient "lab all looks okay with no significant abnormalities. Lipids are much better than before surgery." ------

## 2013-02-12 LAB — VITAMIN B1: Vitamin B1 (Thiamine): 43 nmol/L — ABNORMAL HIGH (ref 8–30)

## 2013-03-10 ENCOUNTER — Ambulatory Visit: Payer: Federal, State, Local not specified - PPO

## 2013-03-14 IMAGING — RF DG UGI W/ KUB
15 of 19 series · 15 of 19 positions shown · non-contrast
Comparison: None.

CLINICAL DATA: Morbid obesity.  Preoperative evaluation for
bariatric surgery.  Left breast reduction.  History of acid reflux
treated medically and diabetes

UPPER GI SERIES WITH KUB
TECHNIQUE: Routine upper GI series was performed with thin barium
Fluoroscopy Time: 1.7 minutes

[Series 1: run · 1 of 1 slices shown (1 of 10)]
[im 1/1]
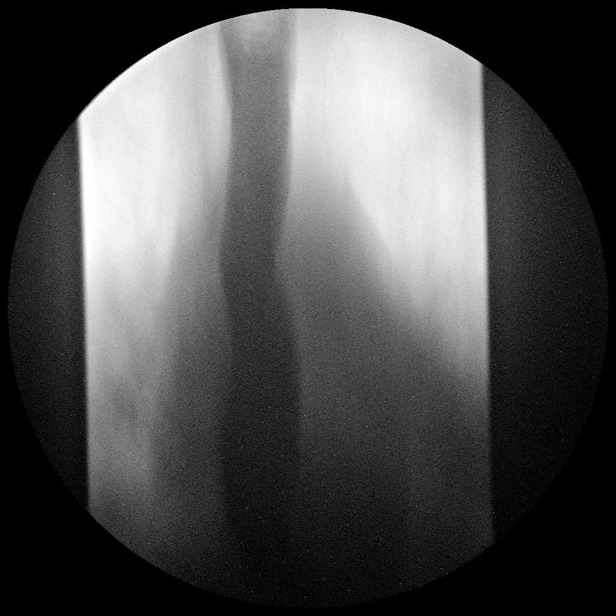

[Series 2: run · 1 of 1 slices shown (2 of 10)]
[im 1/1]
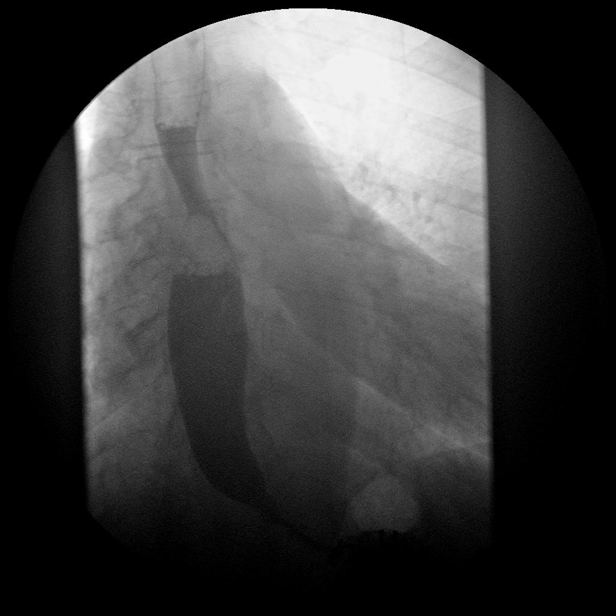

[Series 4: run · 1 of 1 slices shown (3 of 10)]
[im 1/1]
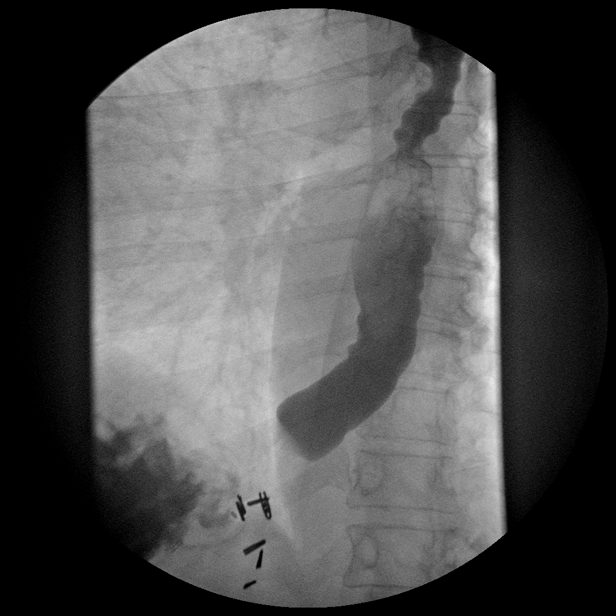

[Series 5: run · 1 of 1 slices shown (4 of 10)]
[im 1/1]
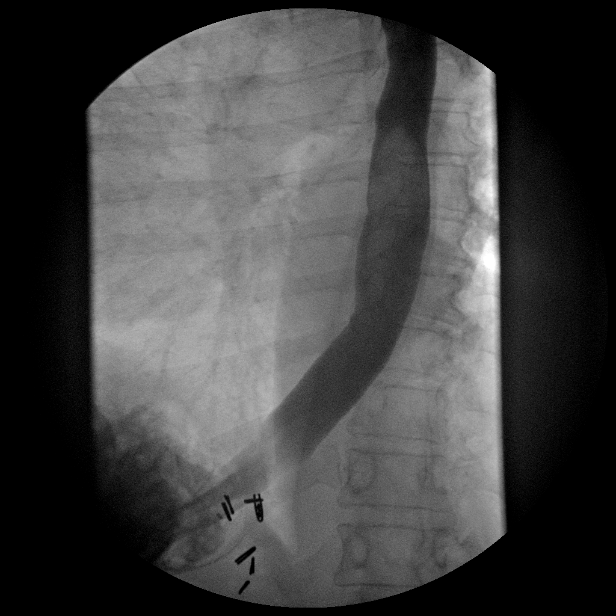

[Series 6: run · 1 of 1 slices shown (5 of 10)]
[im 1/1]
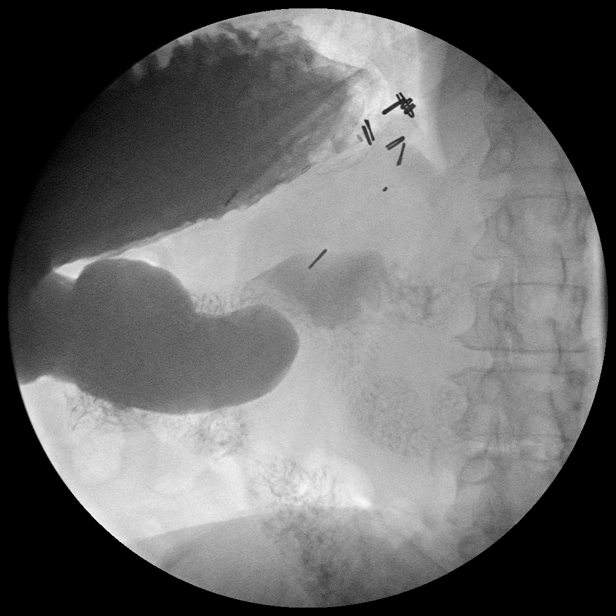

[Series 7: run · 1 of 1 slices shown (6 of 10)]
[im 1/1]
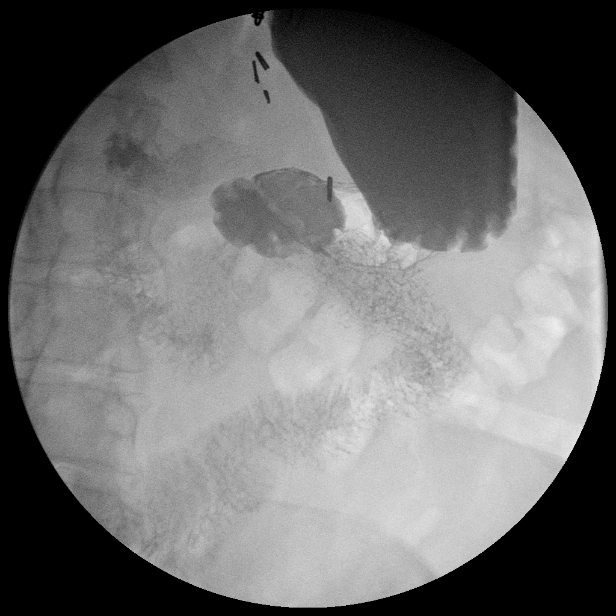

[Series 9: run · 1 of 1 slices shown (7 of 10)]
[im 1/1]
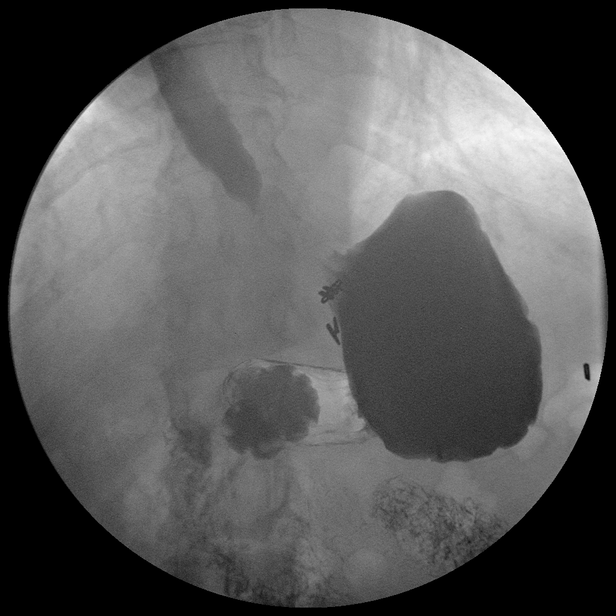

[Series 10: run · 1 of 1 slices shown (8 of 10)]
[im 1/1]
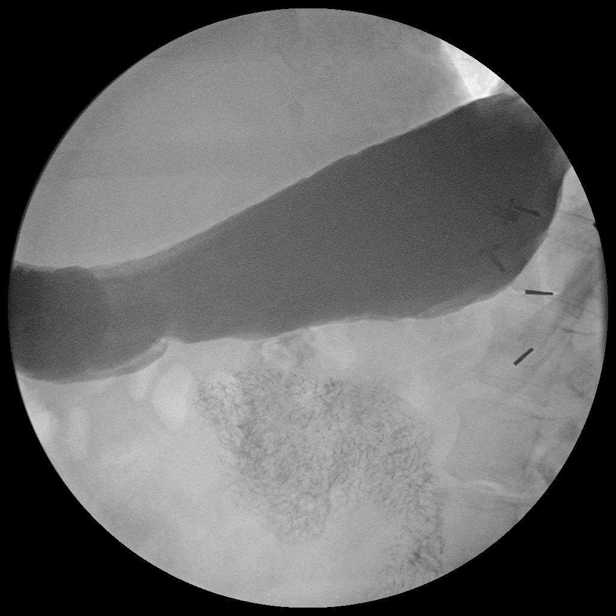

[Series 11: run · 1 of 1 slices shown (9 of 10)]
[im 1/1]
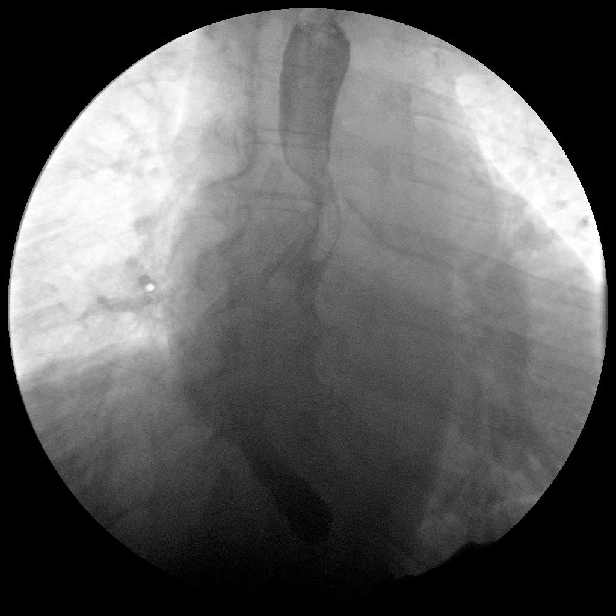

[Series 13: run · 1 of 1 slices shown (10 of 10)]
[im 1/1]
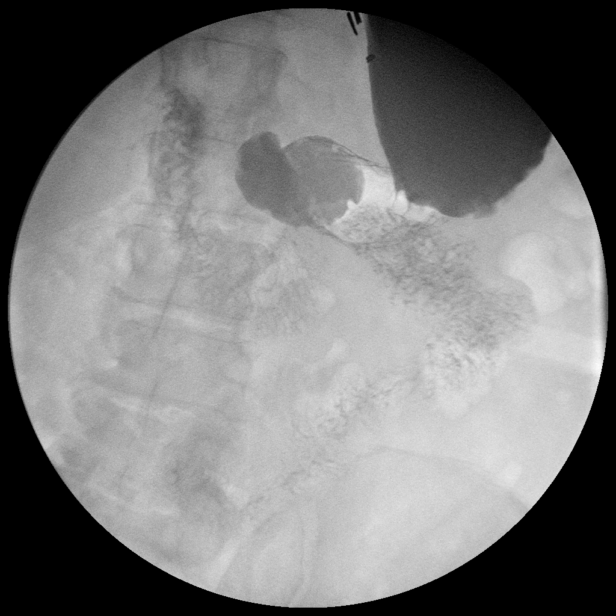

[Series 1001: view not recorded · 0.20mm/px · 1 of 1 slices shown (1 of 5)]
[im 1/1]
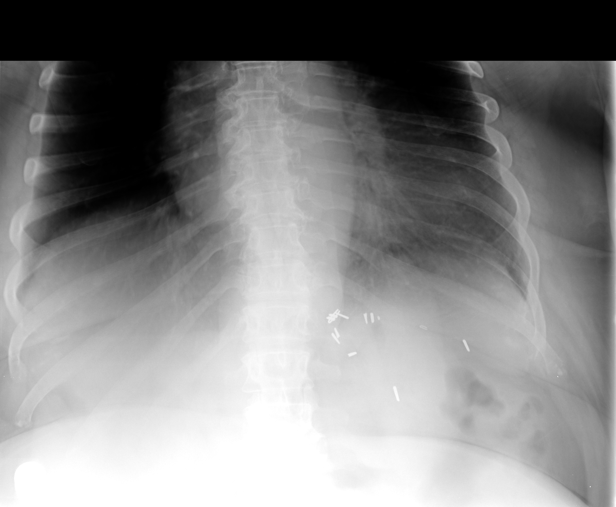

[Series 1002: view not recorded · 0.20mm/px · 1 of 1 slices shown (2 of 5)]
[im 1/1]
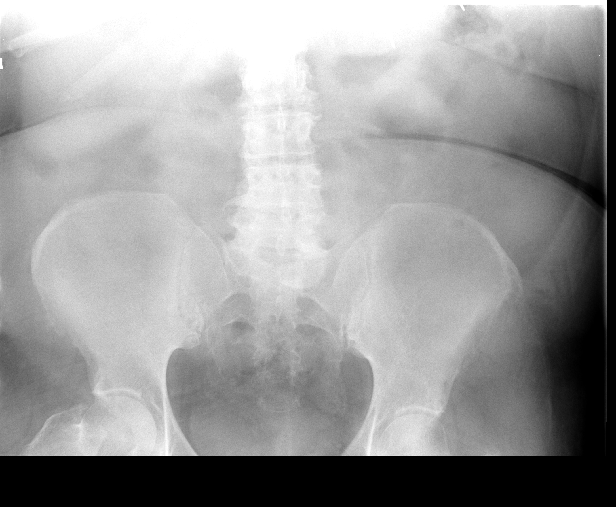

[Series 1003: view not recorded · 0.20mm/px · 1 of 1 slices shown (3 of 5)]
[im 1/1]
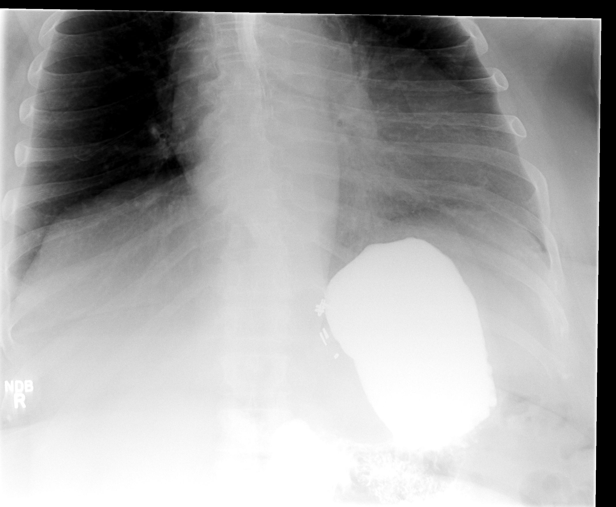

[Series 1005: view not recorded · 0.20mm/px · 1 of 1 slices shown (4 of 5)]
[im 1/1]
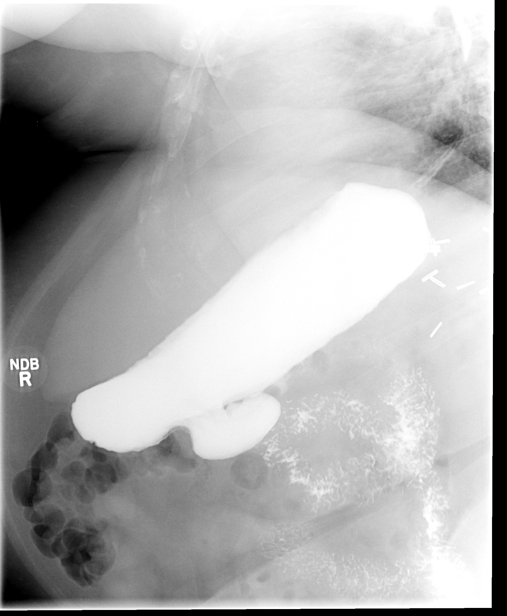

[Series 1006: view not recorded · 0.20mm/px · 1 of 1 slices shown (5 of 5)]
[im 1/1]
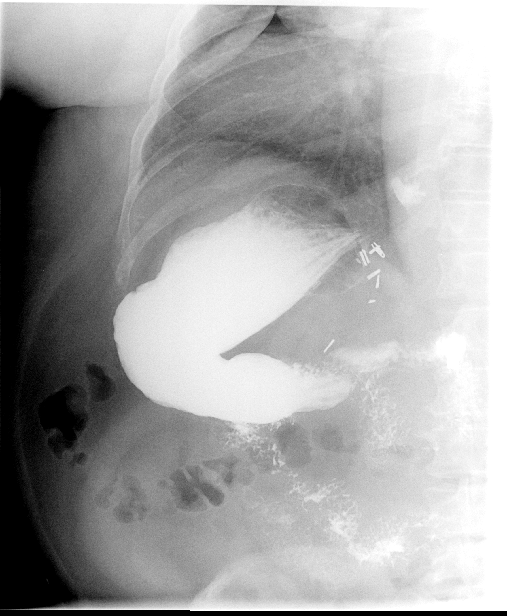

[15 of 19 positions shown; findings below may reference images not displayed]

FINDINGS: KUB:  A nonobstructive bowel gas pattern is seen.
Surgical clips overlie the left breast compatible with history of
prior breast reduction. Vascular calcification is noted in the
internal iliac vessels bilaterally.  Degenerative bony changes of
the lower lumbosacral spine and lower thoracic spine are noted.

Upper GI:  The patient was able to swallow barium without
difficulty.  Esophageal mucosa appeared within normal limits.  No
evidence for hiatal hernia was seen.  A moderate degree of
presbyesophagus was identified at the conclusion of the swallowing
cycle.  Mild gastroesophageal reflux into the distal esophagus was
evident with changes in position and demonstrated rapid clearing.

Gastric mucosa and motility appear within normal limits.  The
duodenal bulb was well distended and has a normal morphology.  The
visualized small bowel to the proximal jejunum appears normal.
IMPRESSION: Findings compatible with moderate presbyesophagus and mild distal
gastroesophageal reflux with good clearing.

## 2013-05-16 ENCOUNTER — Encounter: Payer: Medicare Other | Attending: General Surgery | Admitting: Dietician

## 2013-05-16 DIAGNOSIS — Z9884 Bariatric surgery status: Secondary | ICD-10-CM | POA: Insufficient documentation

## 2013-05-16 DIAGNOSIS — Z713 Dietary counseling and surveillance: Secondary | ICD-10-CM | POA: Insufficient documentation

## 2013-05-16 DIAGNOSIS — E669 Obesity, unspecified: Secondary | ICD-10-CM | POA: Insufficient documentation

## 2013-05-16 DIAGNOSIS — Z09 Encounter for follow-up examination after completed treatment for conditions other than malignant neoplasm: Secondary | ICD-10-CM | POA: Insufficient documentation

## 2013-05-16 NOTE — Patient Instructions (Addendum)
Goals:  Follow Phase 3B: High Protein + Non-Starchy Vegetables  Eat 3-6 small meals/snacks  Increase lean protein foods to meet 60-80g goal  Increase fluid intake to 64oz + (8 glasses per day)  Avoid drinking 15 minutes before, during and 30 minutes after eating  Aim for >30 min of physical activity daily - start using the treadmill  Drink mostly water instead of sweet tea  For breakfast have a boiled egg and cheese or breakfast meat, for lunch have chicken and vegetables, and for dinner have meat and vegetables.  Choose [employer] + Fit for yogurt.   Hold off on fruit and other carbs for now.  TANITA  BODY COMP RESULTS  06/11/12 07/23/12 10/22/12 02/07/13 05/16/13   BMI (kg/m^2) 44.4 41.4 36.4 35.0 34.0   Fat Mass (lbs) 133.0 120.5 93.5 91.0 86.0   Fat Free Mass (lbs) 121.5 117.0 115.5 109.5 109.0   Total Body Water (lbs) 89.0 85.5 84.5 80.0 80.0

## 2013-05-16 NOTE — Progress Notes (Signed)
  Follow-up visit:  13 Month Post-Operative RYGB Surgery  Medical Nutrition Therapy:  Appt start time:  1030   End time:  1100.  Primary concerns today: Post-operative Bariatric Surgery Nutrition Management. [name] [name] returns today with additional 5.5 lb wt loss. Says that she has been eating foods that she shouldn't lately. Reviewed the Phase 3B High Protein and Non Starchy Vegetable diet plan again so that she knows which foods she should be eating.   Surgery date: 04/15/12  Surgery type: RYGB  Start weight at [facility]: 291.6 lb (03/02/12)  Pre-Op Class weight: 293.1 (03/28/12)   Weight today: 195.0 lbs  Weight change: 5.5 lbs Total weight lost: 98.1  lbs (since 03/28/12)  Goal weight: 160-180 lbs  % goal met: 70-82%  TANITA  BODY COMP RESULTS  06/11/12 07/23/12 10/22/12 02/07/13 05/16/13   BMI (kg/m^2) 44.4 41.4 36.4 35.0 34.0   Fat Mass (lbs) 133.0 120.5 93.5 91.0 86.0   Fat Free Mass (lbs) 121.5 117.0 115.5 109.5 109.0   Total Body Water (lbs) 89.0 85.5 84.5 80.0 80.0   24-hr recall: B (AM):  "anything" such as chicken and dumplings or sandwich or banana Snk (AM): 10 butterscotch candy L (AM): NONE - no appetite Snk (3 PM): more candy OR rice and steak D (PM): 1/2 pc [employer] chicken (short thigh), cabbage or green beans Snk (PM):  Candy and bubble   Fluid intake: 16 oz water, regular 8 oz coffee, 3 x week pepsi, 32 oz sweet tea = 50 oz Estimated total protein intake:  40-45 g  Medications:  See list, not taking insulin Supplementation:  Taking MVI, Geritol, Calcium, and B12 regularly  CBG monitoring: Not checking Average CBG per patient:  Not testing  Using straws: No Drinking while eating: Sometimes  Hair loss:  Yes - taking hair, skin and nail vitamins   Carbonated beverages:  Yes, Pepsi 3 x week  N/V/D/C:  None Dumping syndrome:  No  Recent physical activity:  None   Progress Towards Goal(s):  In progress.   Nutritional Diagnosis:  NI-5.7.1 Inadequate protein intake  related to stomach pain from possible ulcer as evidenced by patient reported food recall and intake of < 35% post-op protein needs. - RESOLVED  NI-5.7.1 Inadequate protein intake related to lack of appetite s/p RYGB surgery as evidenced by patient reported food recall and intake of <75% post-op protein needs.     Intervention:  Nutrition education.  Handouts given during visit:  Phase IIIB High Protein + NS Vegetables  Monitoring/Evaluation:  Dietary intake, exercise, and body weight. Follow up in 3 months for 16 month post-op visit.

## 2013-08-14 ENCOUNTER — Encounter: Payer: Medicare Other | Attending: General Surgery | Admitting: Dietician

## 2013-08-14 VITALS — Ht 63.5 in | Wt 185.5 lb

## 2013-08-14 DIAGNOSIS — E669 Obesity, unspecified: Secondary | ICD-10-CM

## 2013-08-14 DIAGNOSIS — Z713 Dietary counseling and surveillance: Secondary | ICD-10-CM | POA: Insufficient documentation

## 2013-08-14 NOTE — Progress Notes (Signed)
  Follow-up visit:  16 Month Post-Operative RYGB Surgery  Medical Nutrition Therapy:  Appt start time:  939   End time:  940  Primary concerns today: Post-operative Bariatric Surgery Nutrition Management.  [name] [name] returns today with a 9.5 lbs weight loss. Eating the same kinds of foods that she was eating before. Has not been following the Phase 3B diet. States she has had a "a lot going on". States she knows what she is supposed to do but just isn't able to do it. Discussed the importance of getting in protein and fluid, limiting carbohydrates and fats, and adding exercise.   Surgery date: 04/15/12  Surgery type: RYGB  Start weight at [facility]: 291.6 lb (03/02/12)  Pre-Op Class weight: 293.1 (03/28/12)   Weight today: 185.5 lbs  Weight change: 9.5 lbs Total weight lost: 107.6  lbs (since 03/28/12)  Goal weight: 160-180 lbs  % goal met: 70-82%  TANITA  BODY COMP RESULTS  06/11/12 07/23/12 10/22/12 02/07/13 05/16/13 08/14/13   BMI (kg/m^2) 44.4 41.4 36.4 35.0 34.0 32.3   Fat Mass (lbs) 133.0 120.5 93.5 91.0 86.0 77.5   Fat Free Mass (lbs) 121.5 117.0 115.5 109.5 109.0 108.0   Total Body Water (lbs) 89.0 85.5 84.5 80.0 80.0 79.0   24-hr recall: B (AM):  "anything" such as chicken and dumplings or sandwich or banana Snk (AM): 10 butterscotch candy L (AM): NONE - no appetite Snk (3 PM): more candy OR rice and steak D (PM): 1/2 pc [employer] chicken (short thigh), cabbage or green beans Snk (PM):  Candy and bubble   Fluid intake: 16 oz water, regular 8 oz coffee, 3 x week pepsi, 32 oz sweet tea = 50 oz Estimated total protein intake:  40-45 g  Medications:  See list, not taking insulin Supplementation:  Taking MVI, Geritol, Calcium, and B12 regularly  CBG monitoring: Not checking Average CBG per patient:  Not testing  Using straws: every once in a while  Drinking while eating: Sometimes  Hair loss:  Yes - taking hair, skin and nail vitamins   Carbonated beverages:  Yes, Pepsi 3 x week   N/V/D/C:  None Dumping syndrome:  No  Recent physical activity:  None   Progress Towards Goal(s):  In progress.   Nutritional Diagnosis:  NI-5.7.1 Inadequate protein intake related to stomach pain from possible ulcer as evidenced by patient reported food recall and intake of < 35% post-op protein needs. - RESOLVED  NI-5.7.1 Inadequate protein intake related to lack of appetite s/p RYGB surgery as evidenced by patient reported food recall and intake of <75% post-op protein needs.     Intervention:  Nutrition education.   Monitoring/Evaluation:  Dietary intake, exercise, and body weight. Follow up in 3 months for 19 month post-op visit.

## 2013-08-14 NOTE — Patient Instructions (Addendum)
Goals:  Follow Phase 3B: High Protein + Non-Starchy Vegetables  Eat 3-6 small meals/snacks  Increase lean protein foods to meet 60-80g goal  Increase fluid intake to 64oz + (8 glasses per day)  Avoid drinking 15 minutes before, during and 30 minutes after eating  Aim for >30 min of physical activity daily - start using the treadmill  Drink mostly water instead of sweet tea  For breakfast have a boiled egg and cheese or breakfast meat, for lunch have chicken and vegetables, and for dinner have meat and vegetables.  Choose Dannon Light + Fit for yogurt.   Hold off on fruit and other carbs for now.   TANITA  BODY COMP RESULTS  06/11/12 07/23/12 10/22/12 02/07/13 05/16/13 08/14/13   BMI (kg/m^2) 44.4 41.4 36.4 35.0 34.0 32.3   Fat Mass (lbs) 133.0 120.5 93.5 91.0 86.0 77.5   Fat Free Mass (lbs) 121.5 117.0 115.5 109.5 109.0 108.0   Total Body Water (lbs) 89.0 85.5 84.5 80.0 80.0 79.0

## 2013-11-17 ENCOUNTER — Encounter: Payer: Medicare Other | Attending: General Surgery | Admitting: Dietician

## 2013-11-17 VITALS — Ht 63.5 in | Wt 178.0 lb

## 2013-11-17 DIAGNOSIS — Z713 Dietary counseling and surveillance: Secondary | ICD-10-CM | POA: Diagnosis not present

## 2013-11-17 DIAGNOSIS — Z9884 Bariatric surgery status: Secondary | ICD-10-CM | POA: Diagnosis not present

## 2013-11-17 DIAGNOSIS — Z6836 Body mass index (BMI) 36.0-36.9, adult: Secondary | ICD-10-CM | POA: Insufficient documentation

## 2013-11-17 DIAGNOSIS — E669 Obesity, unspecified: Secondary | ICD-10-CM

## 2013-11-17 NOTE — Patient Instructions (Addendum)
Goals:  Follow Phase 3B: High Protein + Non-Starchy Vegetables  Eat 3-6 small meals/snacks  Increase lean protein foods to meet 60-80g goal  Increase fluid intake to 64oz + (8 glasses per day)  Limit/avoid sweet tea, soda, and anything with sugar to help prevent blood sugar from spiking.  Limit watermelon 1/2 cup per day and have it with protein (nuts, cheese, meat, shrimp)  Eat something every 3-5 hours you are awake  Avoid drinking 15 minutes before, during and 30 minutes after eating  Aim for >30 min of physical activity daily   Drink mostly water instead of sweet tea  For breakfast have a boiled egg and cheese or breakfast meat, for lunch have chicken and vegetables, and for dinner have meat and vegetables.  Choose Dannon Light + Fit for yogurt (Greek has more protein).  Start weight at [facility]: 291.6 lb (03/02/12)  Pre-Op Class weight: 293.1 (03/28/12) Weight today: 178.0 lbs Weight change: 7.5 lbs Total weight lost: 115.1  lbs (since 03/28/12)  Goal weight: 150 lbs (new goal down from 160-180 lb, met first goal) % goal met: 80%  TANITA  BODY COMP RESULTS  06/11/12 07/23/12 10/22/12 02/07/13 05/16/13 08/14/13 11/17/13   BMI (kg/m^2) 44.4 41.4 36.4 35.0 34.0 32.3 31.0   Fat Mass (lbs) 133.0 120.5 93.5 91.0 86.0 77.5 67.5   Fat Free Mass (lbs) 121.5 117.0 115.5 109.5 109.0 108.0 110.5   Total Body Water (lbs) 89.0 85.5 84.5 80.0 80.0 79.0 81.0

## 2013-11-17 NOTE — Progress Notes (Signed)
  Follow-up visit:  19 Month Post-Operative RYGB Surgery  Medical Nutrition Therapy:  Appt start time:  850   End time:  920  Primary concerns today: Post-operative Bariatric Surgery Nutrition Management. Tinzlee returns today with a 7.5 lbs weight loss. States she has been the same foods are before. Still having sweet tea and not as much soda. Has been eating a lot watermelon. Still not getting in enough fluid or protein. Starting drinking Dannon protein shakes. Planning to start with a walking group this week.   Not testing blood sugar but thinks it might be up since her vision is blurry. Has not had a Hgb A1c lately.   Surgery date: 04/15/12  Surgery type: RYGB  Start weight at [facility]: 291.6 lb (03/02/12)  Pre-Op Class weight: 293.1 (03/28/12) Weight today: 178.0 lbs Weight change: 7.5 lbs Total weight lost: 115.1  lbs (since 03/28/12)  Goal weight: 150 lbs (new goal down from 160-180 lb, met first goal) % goal met: 80%  TANITA  BODY COMP RESULTS  06/11/12 07/23/12 10/22/12 02/07/13 05/16/13 08/14/13 11/17/13   BMI (kg/m^2) 44.4 41.4 36.4 35.0 34.0 32.3 31.0   Fat Mass (lbs) 133.0 120.5 93.5 91.0 86.0 77.5 67.5   Fat Free Mass (lbs) 121.5 117.0 115.5 109.5 109.0 108.0 110.5   Total Body Water (lbs) 89.0 85.5 84.5 80.0 80.0 79.0 81.0   24-hr recall: B (AM):  "anything" such as chicken and dumplings or sandwich or banana Snk (AM): 10 butterscotch candy L (AM): NONE - no appetite Snk (3 PM): more candy OR rice and steak D (PM): 1/2 pc [employer] chicken (short thigh), cabbage or green beans Snk (PM):  Candy and bubble   Fluid intake: 16 oz water, regular 8 oz coffee, 2 x week grape or mountain dew, 32 oz sweet tea = 50 oz Estimated total protein intake:  40-45 g  Medications:  See list, not taking insulin Supplementation:  Taking MVI, Geritol, Calcium, and B12 regularly  CBG monitoring: Not checking Average CBG per patient:  Not testing  Using straws: Yes Drinking while eating: Sometimes   Hair loss:  Yes - taking hair, skin and nail vitamins   Carbonated beverages:  Yes, soda 2 x week  N/V/D/C:  None Dumping syndrome:  No  Recent physical activity:  None   Progress Towards Goal(s):  In progress.   Nutritional Diagnosis:  NI-5.7.1 Inadequate protein intake related to stomach pain from possible ulcer as evidenced by patient reported food recall and intake of < 35% post-op protein needs. - RESOLVED  NI-5.7.1 Inadequate protein intake related to lack of appetite s/p RYGB surgery as evidenced by patient reported food recall and intake of <75% post-op protein needs.     Intervention:  Nutrition education.Diet reinforcement.  Monitoring/Evaluation:  Dietary intake, exercise, and body weight. Follow up in 3 months for 22 month post-op visit.

## 2014-01-08 ENCOUNTER — Ambulatory Visit (INDEPENDENT_AMBULATORY_CARE_PROVIDER_SITE_OTHER): Payer: Medicare Other | Admitting: General Surgery

## 2014-02-18 ENCOUNTER — Ambulatory Visit: Payer: Federal, State, Local not specified - PPO | Admitting: Dietician

## 2014-05-15 ENCOUNTER — Ambulatory Visit: Payer: Federal, State, Local not specified - PPO | Admitting: Cardiology

## 2014-08-25 ENCOUNTER — Ambulatory Visit: Payer: Federal, State, Local not specified - PPO | Admitting: Cardiology

## 2014-11-09 ENCOUNTER — Ambulatory Visit: Payer: Federal, State, Local not specified - PPO | Admitting: Cardiology

## 2014-11-19 ENCOUNTER — Encounter: Payer: Self-pay | Admitting: Cardiology

## 2014-11-19 ENCOUNTER — Ambulatory Visit (INDEPENDENT_AMBULATORY_CARE_PROVIDER_SITE_OTHER): Payer: Medicare Other | Admitting: Cardiology

## 2014-11-19 VITALS — BP 112/62 | HR 91 | Ht 63.5 in | Wt 163.0 lb

## 2014-11-19 DIAGNOSIS — I4892 Unspecified atrial flutter: Secondary | ICD-10-CM | POA: Insufficient documentation

## 2014-11-19 DIAGNOSIS — I10 Essential (primary) hypertension: Secondary | ICD-10-CM | POA: Diagnosis not present

## 2014-11-19 DIAGNOSIS — E785 Hyperlipidemia, unspecified: Secondary | ICD-10-CM | POA: Diagnosis not present

## 2014-11-19 DIAGNOSIS — Z7901 Long term (current) use of anticoagulants: Secondary | ICD-10-CM | POA: Diagnosis not present

## 2014-11-19 NOTE — Patient Instructions (Addendum)
Medication Instructions:  Your physician recommends that you continue on your current medications as directed. Please refer to the Current Medication list given to you today.   Labwork: None ordered  Testing/Procedures: None ordered  Follow-Up: Your physician wants you to follow-up in: 6 months with Dr.Smith You will receive a reminder letter in the mail two months in advance. If you don't receive a letter, please call our office to schedule the follow-up appointment.   Any Other Special Instructions Will Be Listed Below (If Applicable).   

## 2014-11-19 NOTE — Progress Notes (Signed)
Cardiology Office Note   Date:  11/19/2014   ID:  [name] [name], DOB 05/25/1945, MRN [id number]  PCP:  Katy Apo, MD  Cardiologist:   Donato Schultz, MD     History of Present Illness: [name] [name] is a 69 y.o. female who presents for follow-up with history of atrial flutter/supraventricular tachycardia (previously seen and evaluated by Dr. Johney Frame of electrophysiology), with a history of pulmonary embolism in the past on chronic anticoagulation, chronic kidney disease, former morbid obesity with extensive weight loss, patient of Dr. Nehemiah Settle.  She has had a history of atrial arrhythmias, atrial flutter like with heart rates of 130 bpm. The atrial arrhythmia/atrial flutter had been chronic. It is been rate controlled in the past with both metoprolol and diltiazem.  Last time I saw her was 02/15/12 for surgical risk stratification prior to gastric bypass surgery. When I saw her last her weight was 294 pounds.  In the distant past, we have tried aggressive diuresis on her in the hospital with both IV Lasix as well as metolazone thinking that this would help with her shortness of breath however it resulted in prerenal azotemia only.  Prior stress test in April 2010 was low risk showing no ischemia, possible inferolateral defect that was fixed.  Overall she has felt much better since having weight loss. She states that she is able to walk around the park without difficulty. No longer short of breath. She is not feeling any palpitations.    Past Medical History  Diagnosis Date  . Heart murmur   . Shortness of breath   . Blood dyscrasia   . Mental disorder   . Hypertension   . CHF (congestive heart failure)   . Blood transfusion   . GERD (gastroesophageal reflux disease)   . Anxiety   . Diabetes mellitus   . Depression   . Pulmonary embolism APPROX 5 YRS AGO    on lifelong coumadin  . Pneumonia     hx of bronchitis  . Chronic back pain   . Gout     hx of left foot  .  Shingles     RT SIDE OF FACE  . Sciatic nerve pain     right leg  . Dysrhythmia      sees Dr. Anne Fu atrial fib  . 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 Chronic kidney disease, stage III (moderate)   . RENAL INSUFFICIENCY, CHRONIC 03/19/2010  . Arthritis     legs,back  . Anemia     Past Surgical History  Procedure Laterality Date  . Vascular surgery      vein removed in legs  . Dilation and curettage of uterus    . Joint replacement      both knees  . Tubal ligation    . Shoulder surgery      right shoulder I& D secondary to septic joint  . Laser ablation      "for dysrhythmia's approx. 4 years ago"  . Mammary  11/06/2011  . Breast reduction surgery  11/06/2011    Procedure: MAMMARY REDUCTION  (BREAST);  Surgeon: Louisa Second, MD;  Location[facility] OR;  Service: Plastics;  Laterality: Bilateral;  . Breath tek h pylori  03/08/2012    Procedure: BREATH TEK H PYLORI;  Surgeon: Valarie Merino, MD;  Location: [facility];  Service: General;  Laterality: N/A;  . 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 Upper gi endoscopy  04/15/2012    Procedure: UPPER GI ENDOSCOPY;  Surgeon: Mariella Saa, MD;  Location: [facility] ORS;  Service: General;;     Current Outpatient Prescriptions  Medication Sig Dispense Refill  . B-D INS SYR ULTRAFINE 1CC/30G 30G X 1/2" 1 ML MISC     . diltiazem (CARDIZEM CD) 120 MG 24 hr capsule Take 120 mg by mouth every morning.     . furosemide (LASIX) 80 MG tablet Take 40 mg by mouth 2 (two) times daily.     Marland Kitchen gabapentin (NEURONTIN) 600 MG tablet Take 600 mg by mouth 2 (two) times daily.     . Iron-Vitamins (GERITOL) LIQD Take 5 mLs by mouth daily.    . meclizine (ANTIVERT) 25 MG tablet Take 25 mg by mouth 3 (three) times daily as needed. vertigo    . metoprolol (TOPROL-XL) 100 MG 24 hr tablet Take  100 mg by mouth every morning.     . pantoprazole (PROTONIX) 40 MG tablet Take 40 mg by mouth daily.    . potassium chloride (K-DUR) 10 MEQ tablet Take 20 mEq by mouth 2 (two) times daily.     . promethazine (PHENERGAN) 25 MG tablet Take 25 mg by mouth every 4 (four) hours as needed. For nausea and vomiting    . sertraline (ZOLOFT) 50 MG tablet Take 50 mg by mouth every morning.     . simvastatin (ZOCOR) 40 MG tablet Take 40 mg by mouth at bedtime.     . WARFARIN SODIUM PO Take by mouth as directed. Alternate between  and     . zolpidem (AMBIEN) 10 MG tablet Take 10 mg by mouth at bedtime.      No current facility-administered medications for this visit.    Allergies:   Other and Shellfish allergy    Social History:  The patient  reports that she quit smoking about 42 years ago. She has never used smokeless tobacco. She reports that she does not drink alcohol or use illicit drugs.   Family History:  The patient's father died of cancer of the lung, mother died of dementia, diabetes, no early family history of coronary artery disease. She has one adopted daughter.    ROS:  Please see the history of present illness.   Otherwise, review of systems are positive for none.   All other systems are reviewed and negative.    PHYSICAL EXAM: VS:  BP 112/62 mmHg  Pulse 91  Ht 5' 3.5" (1.613 m)  Wt 163 lb (73.936 kg)  BMI 28.42 kg/m2 , BMI Body mass index is 28.42 kg/(m^2). GEN: Well nourished, well developed, in no acute distressWeight loss noted HEENT: normal Neck: no JVD, carotid bruits, or masses Cardiac: RRR; no murmurs, rubs, or gallops,no edema occasional ectopy noted Respiratory:  clear to auscultation bilaterally, normal work of breathing GI: soft, nontender, nondistended, + BS MS: no deformity or atrophy Skin: warm and dry, no rash, no significant lower extremity edema Neuro:  Strength and sensation are intact Psych: euthymic mood, full affect   EKG:  EKG is ordered  today. The ekg ordered today demonstrates from today 11/19/14-atrial flutter with verbal AV block heart rate 91 bpm, low voltage QRS, nonspecific ST-T wave changes.  Recent Labs: No results found for requested labs within last 365 days.    Lipid Panel    Component Value Date/Time   CHOL 186  02/07/2013 0847   TRIG 184* 02/07/2013 0847   HDL 38* 02/07/2013 0847   CHOLHDL 4.9 02/07/2013 0847   VLDL 37 02/07/2013 0847   LDLCALC 111* 02/07/2013 0847    Creatinine 1.72  Wt Readings from Last 3 Encounters:  11/19/14 163 lb (73.936 kg)  11/17/13 178 lb (80.74 kg)  08/14/13 185 lb 8 oz (84.142 kg)      Other studies Reviewed: Additional studies/ records that were reviewed today include: Prior office notes, lab work, Holter monitor. Review of the above records demonstrates: As above   ASSESSMENT AND PLAN:  1.  Atrial flutter with variable AV block-current heart rate 91 bpm. Flutter has been persistent for quite some time. Under good rate control. She is feeling much better since weight loss. Able to walk without difficulty, no shortness of breath, no syncope, no orthopnea. Since her atrial flutter has been [place]-standing and well tolerated, we'll continue with rate control approach. No change in metoprolol or diltiazem. Continue with chronic anticoagulation.  2. Chronic anticoagulation - CHADS-VASC 3. Told her about the possibility of novel oral anticoagulation. She will be changing to higher medication coverage next year. May wish to switch to one of these medications. Overall however she is doing well with her Coumadin. Dr. Nehemiah Settle has been managing.  3. Weight loss-gastric bypass surgery, Dr. Johna Sheriff  4. Diabetes with renal manifestations-creatinine has been 1.7 in the past. She is now off of insulin.  5. Hyperlipidemia-continue with simvastatin.   Current medicines are reviewed at length with the patient today.  The patient does not have concerns regarding medicines.  The  following changes have been made:  no change  Labs/ tests ordered today include: None  Orders Placed This Encounter  Procedures  . EKG 12-Lead     Disposition:   FU with Kylen Schliep in 6 months  Signed, Donato Schultz, MD  11/19/2014 8:47 AM    [facility] [address] [place], [place], [place]  [address] Phone: [phone]; Fax: [phone]

## 2014-12-25 ENCOUNTER — Encounter (HOSPITAL_BASED_OUTPATIENT_CLINIC_OR_DEPARTMENT_OTHER): Payer: Self-pay | Admitting: *Deleted

## 2014-12-31 ENCOUNTER — Encounter (HOSPITAL_BASED_OUTPATIENT_CLINIC_OR_DEPARTMENT_OTHER)
Admission: RE | Admit: 2014-12-31 | Discharge: 2014-12-31 | Disposition: A | Discharge status: Home / Self Care | Payer: Medicare Other | Source: Ambulatory Visit | Attending: Plastic Surgery | Admitting: Plastic Surgery

## 2014-12-31 DIAGNOSIS — I2782 Chronic pulmonary embolism: Secondary | ICD-10-CM | POA: Diagnosis not present

## 2014-12-31 DIAGNOSIS — Z9884 Bariatric surgery status: Secondary | ICD-10-CM | POA: Diagnosis not present

## 2014-12-31 DIAGNOSIS — Z794 Long term (current) use of insulin: Secondary | ICD-10-CM | POA: Diagnosis not present

## 2014-12-31 DIAGNOSIS — I10 Essential (primary) hypertension: Secondary | ICD-10-CM | POA: Diagnosis not present

## 2014-12-31 DIAGNOSIS — I129 Hypertensive chronic kidney disease with stage 1 through stage 4 chronic kidney disease, or unspecified chronic kidney disease: Secondary | ICD-10-CM | POA: Diagnosis not present

## 2014-12-31 DIAGNOSIS — N189 Chronic kidney disease, unspecified: Secondary | ICD-10-CM | POA: Diagnosis not present

## 2014-12-31 DIAGNOSIS — E1122 Type 2 diabetes mellitus with diabetic chronic kidney disease: Secondary | ICD-10-CM | POA: Diagnosis not present

## 2014-12-31 DIAGNOSIS — Z7901 Long term (current) use of anticoagulants: Secondary | ICD-10-CM | POA: Diagnosis not present

## 2014-12-31 DIAGNOSIS — E65 Localized adiposity: Secondary | ICD-10-CM | POA: Diagnosis present

## 2014-12-31 DIAGNOSIS — Z87891 Personal history of nicotine dependence: Secondary | ICD-10-CM | POA: Diagnosis not present

## 2014-12-31 DIAGNOSIS — R001 Bradycardia, unspecified: Secondary | ICD-10-CM | POA: Diagnosis not present

## 2014-12-31 DIAGNOSIS — E785 Hyperlipidemia, unspecified: Secondary | ICD-10-CM | POA: Diagnosis not present

## 2014-12-31 LAB — CBC WITH DIFFERENTIAL/PLATELET
BASOS ABS: 0.1 10*3/uL (ref 0.0–0.1)
Basophils Relative: 1 %
EOS ABS: 0.1 10*3/uL (ref 0.0–0.7)
EOS PCT: 2 %
HCT: 38 % (ref 36.0–46.0)
Hemoglobin: 12.2 g/dL (ref 12.0–15.0)
LYMPHS PCT: 60 %
Lymphs Abs: 2.3 10*3/uL (ref 0.7–4.0)
MCH: 30.3 pg (ref 26.0–34.0)
MCHC: 32.1 g/dL (ref 30.0–36.0)
MCV: 94.3 fL (ref 78.0–100.0)
Monocytes Absolute: 0.3 10*3/uL (ref 0.1–1.0)
Monocytes Relative: 8 %
NEUTROS PCT: 29 %
Neutro Abs: 1.1 10*3/uL — ABNORMAL LOW (ref 1.7–7.7)
PLATELETS: 238 10*3/uL (ref 150–400)
RBC: 4.03 MIL/uL (ref 3.87–5.11)
RDW: 13.7 % (ref 11.5–15.5)
WBC: 3.9 10*3/uL — AB (ref 4.0–10.5)

## 2014-12-31 LAB — BASIC METABOLIC PANEL
ANION GAP: 8 (ref 5–15)
BUN: 18 mg/dL (ref 6–20)
CO2: 26 mmol/L (ref 22–32)
Calcium: 8.7 mg/dL — ABNORMAL LOW (ref 8.9–10.3)
Chloride: 107 mmol/L (ref 101–111)
Creatinine, Ser: 1.36 mg/dL — ABNORMAL HIGH (ref 0.44–1.00)
GFR calc Af Amer: 46 mL/min — ABNORMAL LOW (ref >60)
GFR, EST NON AFRICAN AMERICAN: 40 mL/min — AB (ref >60)
Glucose, Bld: 89 mg/dL (ref 65–99)
POTASSIUM: 3.8 mmol/L (ref 3.5–5.1)
SODIUM: 141 mmol/L (ref 135–145)

## 2014-12-31 LAB — PROTIME-INR
INR: 1.2 (ref 0.00–1.49)
PROTHROMBIN TIME: 15.4 s — AB (ref 11.6–15.2)

## 2014-12-31 NOTE — Anesthesia Preprocedure Evaluation (Addendum)
Anesthesia Evaluation  Patient identified by MRN, date of birth, ID band Patient awake    Reviewed: Allergy & Precautions, NPO status , Patient's Chart, lab work & pertinent test results  Airway Mallampati: II  TM Distance: >3 FB Neck ROM: Full    Dental no notable dental hx.    Pulmonary former smoker, PE   Pulmonary exam normal breath sounds clear to auscultation       Cardiovascular hypertension, Pt. on medications Normal cardiovascular exam+ dysrhythmias Atrial Fibrillation  Rhythm:Regular Rate:Normal     Neuro/Psych negative neurological ROS  negative psych ROS   GI/Hepatic negative GI ROS, Neg liver ROS,   Endo/Other  negative endocrine ROSdiabetes  Renal/GU Renal InsufficiencyRenal disease  negative genitourinary   Musculoskeletal negative musculoskeletal ROS (+)   Abdominal   Peds negative pediatric ROS (+)  Hematology negative hematology ROS (+)   Anesthesia Other Findings   Reproductive/Obstetrics negative OB ROS                            Anesthesia Physical Anesthesia Plan  ASA: III  Anesthesia Plan: General   Post-op Pain Management:    Induction: Intravenous  Airway Management Planned: Oral ETT  Additional Equipment:   Intra-op Plan:   Post-operative Plan: Extubation in OR  Informed Consent: I have reviewed the patients History and Physical, chart, labs and discussed the procedure including the risks, benefits and alternatives for the proposed anesthesia with the patient or authorized representative who has indicated his/her understanding and acceptance.   Dental advisory given  Plan Discussed with: CRNA and Surgeon  Anesthesia Plan Comments:         Anesthesia Quick Evaluation

## 2014-12-31 NOTE — Progress Notes (Signed)
Spoke with Dr Leta Baptist, she is ok with lab results

## 2015-01-01 ENCOUNTER — Ambulatory Visit (HOSPITAL_BASED_OUTPATIENT_CLINIC_OR_DEPARTMENT_OTHER)
Admission: RE | Admit: 2015-01-01 | Discharge: 2015-01-02 | Disposition: A | Discharge status: Home / Self Care | Payer: Medicare Other | Source: Ambulatory Visit | Attending: Plastic Surgery | Admitting: Plastic Surgery

## 2015-01-01 ENCOUNTER — Ambulatory Visit (HOSPITAL_BASED_OUTPATIENT_CLINIC_OR_DEPARTMENT_OTHER): Payer: Medicare Other | Admitting: Anesthesiology

## 2015-01-01 ENCOUNTER — Encounter (HOSPITAL_BASED_OUTPATIENT_CLINIC_OR_DEPARTMENT_OTHER): Payer: Self-pay | Admitting: *Deleted

## 2015-01-01 ENCOUNTER — Encounter (HOSPITAL_BASED_OUTPATIENT_CLINIC_OR_DEPARTMENT_OTHER)
Admission: RE | Disposition: A | Discharge status: Home / Self Care | Payer: Self-pay | Source: Ambulatory Visit | Attending: Plastic Surgery

## 2015-01-01 DIAGNOSIS — I129 Hypertensive chronic kidney disease with stage 1 through stage 4 chronic kidney disease, or unspecified chronic kidney disease: Secondary | ICD-10-CM | POA: Insufficient documentation

## 2015-01-01 DIAGNOSIS — Z794 Long term (current) use of insulin: Secondary | ICD-10-CM | POA: Insufficient documentation

## 2015-01-01 DIAGNOSIS — E65 Localized adiposity: Secondary | ICD-10-CM | POA: Insufficient documentation

## 2015-01-01 DIAGNOSIS — Z87891 Personal history of nicotine dependence: Secondary | ICD-10-CM | POA: Insufficient documentation

## 2015-01-01 DIAGNOSIS — E785 Hyperlipidemia, unspecified: Secondary | ICD-10-CM | POA: Insufficient documentation

## 2015-01-01 DIAGNOSIS — Z9884 Bariatric surgery status: Secondary | ICD-10-CM | POA: Diagnosis not present

## 2015-01-01 DIAGNOSIS — I10 Essential (primary) hypertension: Secondary | ICD-10-CM | POA: Insufficient documentation

## 2015-01-01 DIAGNOSIS — N189 Chronic kidney disease, unspecified: Secondary | ICD-10-CM | POA: Insufficient documentation

## 2015-01-01 DIAGNOSIS — I2782 Chronic pulmonary embolism: Secondary | ICD-10-CM | POA: Insufficient documentation

## 2015-01-01 DIAGNOSIS — R001 Bradycardia, unspecified: Secondary | ICD-10-CM | POA: Insufficient documentation

## 2015-01-01 DIAGNOSIS — M793 Panniculitis, unspecified: Secondary | ICD-10-CM | POA: Diagnosis present

## 2015-01-01 DIAGNOSIS — E1122 Type 2 diabetes mellitus with diabetic chronic kidney disease: Secondary | ICD-10-CM | POA: Insufficient documentation

## 2015-01-01 DIAGNOSIS — Z7901 Long term (current) use of anticoagulants: Secondary | ICD-10-CM | POA: Insufficient documentation

## 2015-01-01 HISTORY — PX: PANNICULECTOMY: SHX5360

## 2015-01-01 LAB — HEMOGLOBIN A1C
HEMOGLOBIN A1C: 6.2 % — AB (ref 4.8–5.6)
MEAN PLASMA GLUCOSE: 131 mg/dL

## 2015-01-01 LAB — GLUCOSE, CAPILLARY: GLUCOSE-CAPILLARY: 92 mg/dL (ref 65–99)

## 2015-01-01 SURGERY — PANNICULECTOMY
Anesthesia: General | Site: Abdomen

## 2015-01-01 MED ORDER — ACETAMINOPHEN 10 MG/ML IV SOLN
INTRAVENOUS | Status: DC | PRN
  Administered 2015-01-01: 1000 mg via INTRAVENOUS

## 2015-01-01 MED ORDER — OXYCODONE-ACETAMINOPHEN 10-325 MG PO TABS: 1.0000 | ORAL_TABLET | ORAL | Status: AC | PRN

## 2015-01-01 MED ORDER — ONDANSETRON 4 MG PO TBDP: 4.0000 mg | ORAL_TABLET | Freq: Four times a day (QID) | ORAL | Status: DC | PRN

## 2015-01-01 MED ORDER — SUCCINYLCHOLINE CHLORIDE 20 MG/ML IJ SOLN
INTRAMUSCULAR | Status: DC | PRN
  Administered 2015-01-01: 100 mg via INTRAVENOUS

## 2015-01-01 MED ORDER — LIDOCAINE HCL (CARDIAC) 20 MG/ML IV SOLN
INTRAVENOUS | Status: AC
  Filled 2015-01-01: qty 5

## 2015-01-01 MED ORDER — SUFENTANIL CITRATE 50 MCG/ML IV SOLN
INTRAVENOUS | Status: AC
  Filled 2015-01-01: qty 1

## 2015-01-01 MED ORDER — SUFENTANIL CITRATE 50 MCG/ML IV SOLN
INTRAVENOUS | Status: DC | PRN
  Administered 2015-01-01 (×2): 5 ug via INTRAVENOUS

## 2015-01-01 MED ORDER — PROPOFOL 500 MG/50ML IV EMUL
INTRAVENOUS | Status: AC
  Filled 2015-01-01: qty 50

## 2015-01-01 MED ORDER — MIDAZOLAM HCL 2 MG/2ML IJ SOLN
INTRAMUSCULAR | Status: AC
  Filled 2015-01-01: qty 4

## 2015-01-01 MED ORDER — DILTIAZEM HCL ER COATED BEADS 120 MG PO CP24: 120.0000 mg | ORAL_CAPSULE | Freq: Every morning | ORAL | Status: DC

## 2015-01-01 MED ORDER — MIDAZOLAM HCL 5 MG/5ML IJ SOLN
INTRAMUSCULAR | Status: DC | PRN
  Administered 2015-01-01: 2 mg via INTRAVENOUS

## 2015-01-01 MED ORDER — KETOROLAC TROMETHAMINE 30 MG/ML IJ SOLN: 30.0000 mg | Freq: Once | INTRAMUSCULAR | Status: DC

## 2015-01-01 MED ORDER — LIDOCAINE HCL (CARDIAC) 20 MG/ML IV SOLN
INTRAVENOUS | Status: DC | PRN
  Administered 2015-01-01: 50 mg via INTRAVENOUS

## 2015-01-01 MED ORDER — DEXAMETHASONE SODIUM PHOSPHATE 10 MG/ML IJ SOLN
INTRAMUSCULAR | Status: AC
  Filled 2015-01-01: qty 1

## 2015-01-01 MED ORDER — BUPIVACAINE LIPOSOME 1.3 % IJ SUSP
INTRAMUSCULAR | Status: AC
  Filled 2015-01-01: qty 20

## 2015-01-01 MED ORDER — SODIUM CHLORIDE 0.9 % IV SOLN
INTRAVENOUS | Status: DC | PRN
  Administered 2015-01-01: 100 mL

## 2015-01-01 MED ORDER — ACETAMINOPHEN 10 MG/ML IV SOLN
INTRAVENOUS | Status: AC
  Filled 2015-01-01: qty 100

## 2015-01-01 MED ORDER — SODIUM CHLORIDE 0.9 % IV SOLN
10.0000 mg | INTRAVENOUS | Status: DC | PRN
  Administered 2015-01-01 (×2): 40 ug/min via INTRAVENOUS

## 2015-01-01 MED ORDER — ENOXAPARIN SODIUM 40 MG/0.4ML ~~LOC~~ SOLN
SUBCUTANEOUS | Status: AC
  Filled 2015-01-01: qty 0.4

## 2015-01-01 MED ORDER — PROMETHAZINE HCL 25 MG/ML IJ SOLN: 6.2500 mg | INTRAMUSCULAR | Status: DC | PRN

## 2015-01-01 MED ORDER — EPHEDRINE SULFATE 50 MG/ML IJ SOLN
INTRAMUSCULAR | Status: DC | PRN
  Administered 2015-01-01: 10 mg via INTRAVENOUS

## 2015-01-01 MED ORDER — FENTANYL CITRATE (PF) 100 MCG/2ML IJ SOLN: 25.0000 ug | INTRAMUSCULAR | Status: DC | PRN

## 2015-01-01 MED ORDER — OXYCODONE-ACETAMINOPHEN 5-325 MG PO TABS
1.0000 | ORAL_TABLET | ORAL | Status: DC | PRN
  Administered 2015-01-01 – 2015-01-02 (×3): 2 via ORAL
  Filled 2015-01-01 (×3): qty 2

## 2015-01-01 MED ORDER — HYDROMORPHONE HCL 1 MG/ML IJ SOLN
0.2500 mg | INTRAMUSCULAR | Status: DC | PRN
  Administered 2015-01-01 (×2): 0.5 mg via INTRAVENOUS

## 2015-01-01 MED ORDER — EPHEDRINE SULFATE 50 MG/ML IJ SOLN
INTRAMUSCULAR | Status: AC
  Filled 2015-01-01: qty 1

## 2015-01-01 MED ORDER — KCL IN DEXTROSE-NACL 20-5-0.45 MEQ/L-%-% IV SOLN
INTRAVENOUS | Status: DC
  Administered 2015-01-01: 13:00:00 via INTRAVENOUS
  Filled 2015-01-01 (×2): qty 1000

## 2015-01-01 MED ORDER — HYDROMORPHONE HCL 1 MG/ML IJ SOLN: 0.5000 mg | INTRAMUSCULAR | Status: DC | PRN

## 2015-01-01 MED ORDER — METOPROLOL SUCCINATE ER 100 MG PO TB24: 100.0000 mg | ORAL_TABLET | Freq: Every morning | ORAL | Status: DC

## 2015-01-01 MED ORDER — PHENYLEPHRINE HCL 10 MG/ML IJ SOLN
INTRAMUSCULAR | Status: DC | PRN
  Administered 2015-01-01: 80 ug via INTRAVENOUS

## 2015-01-01 MED ORDER — SUCCINYLCHOLINE CHLORIDE 20 MG/ML IJ SOLN
INTRAMUSCULAR | Status: AC
  Filled 2015-01-01: qty 1

## 2015-01-01 MED ORDER — SERTRALINE HCL 50 MG PO TABS: 50.0000 mg | ORAL_TABLET | Freq: Every morning | ORAL | Status: DC

## 2015-01-01 MED ORDER — CEFAZOLIN SODIUM-DEXTROSE 2-3 GM-% IV SOLR
2.0000 g | Freq: Three times a day (TID) | INTRAVENOUS | Status: AC
  Administered 2015-01-01: 2 g via INTRAVENOUS
  Filled 2015-01-01: qty 50

## 2015-01-01 MED ORDER — POTASSIUM CHLORIDE ER 10 MEQ PO TBCR
20.0000 meq | EXTENDED_RELEASE_TABLET | Freq: Two times a day (BID) | ORAL | Status: DC
  Administered 2015-01-01: 20 meq via ORAL

## 2015-01-01 MED ORDER — PANTOPRAZOLE SODIUM 40 MG PO TBEC: 40.0000 mg | DELAYED_RELEASE_TABLET | Freq: Every day | ORAL | Status: DC

## 2015-01-01 MED ORDER — LACTATED RINGERS IV SOLN
INTRAVENOUS | Status: DC
  Administered 2015-01-01: 08:00:00 via INTRAVENOUS
  Administered 2015-01-01: 10 mL/h via INTRAVENOUS

## 2015-01-01 MED ORDER — PROPOFOL 10 MG/ML IV BOLUS
INTRAVENOUS | Status: DC | PRN
  Administered 2015-01-01: 150 mg via INTRAVENOUS

## 2015-01-01 MED ORDER — HYDROMORPHONE HCL 1 MG/ML IJ SOLN
INTRAMUSCULAR | Status: AC
  Filled 2015-01-01: qty 1

## 2015-01-01 MED ORDER — CEFAZOLIN SODIUM-DEXTROSE 2-3 GM-% IV SOLR
INTRAVENOUS | Status: AC
  Filled 2015-01-01: qty 50

## 2015-01-01 MED ORDER — FUROSEMIDE 40 MG PO TABS
40.0000 mg | ORAL_TABLET | Freq: Two times a day (BID) | ORAL | Status: DC
  Administered 2015-01-01: 40 mg via ORAL

## 2015-01-01 MED ORDER — SCOPOLAMINE 1 MG/3DAYS TD PT72: 1.0000 | MEDICATED_PATCH | Freq: Once | TRANSDERMAL | Status: DC | PRN

## 2015-01-01 MED ORDER — PROMETHAZINE HCL 25 MG PO TABS: 25.0000 mg | ORAL_TABLET | Freq: Four times a day (QID) | ORAL | Status: DC | PRN

## 2015-01-01 MED ORDER — GABAPENTIN 600 MG PO TABS
600.0000 mg | ORAL_TABLET | Freq: Two times a day (BID) | ORAL | Status: DC
  Administered 2015-01-01: 600 mg via ORAL

## 2015-01-01 MED ORDER — ENOXAPARIN SODIUM 100 MG/ML ~~LOC~~ SOLN
100.0000 mg | SUBCUTANEOUS | Status: DC
  Administered 2015-01-01: 100 mg via SUBCUTANEOUS
  Filled 2015-01-01: qty 1

## 2015-01-01 MED ORDER — DEXAMETHASONE SODIUM PHOSPHATE 4 MG/ML IJ SOLN
INTRAMUSCULAR | Status: DC | PRN
  Administered 2015-01-01: 8 mg via INTRAVENOUS

## 2015-01-01 MED ORDER — CHLORHEXIDINE GLUCONATE 4 % EX LIQD: 1.0000 "application " | Freq: Once | CUTANEOUS | Status: DC

## 2015-01-01 MED ORDER — ONDANSETRON HCL 4 MG/2ML IJ SOLN: 4.0000 mg | Freq: Four times a day (QID) | INTRAMUSCULAR | Status: DC | PRN

## 2015-01-01 MED ORDER — CEFAZOLIN SODIUM-DEXTROSE 2-3 GM-% IV SOLR
2.0000 g | INTRAVENOUS | Status: AC
  Administered 2015-01-01: 2 g via INTRAVENOUS

## 2015-01-01 MED ORDER — ONDANSETRON HCL 4 MG/2ML IJ SOLN
INTRAMUSCULAR | Status: AC
  Filled 2015-01-01: qty 2

## 2015-01-01 MED ORDER — ONDANSETRON HCL 4 MG/2ML IJ SOLN
INTRAMUSCULAR | Status: DC | PRN
  Administered 2015-01-01: 4 mg via INTRAVENOUS

## 2015-01-01 MED ORDER — GLYCOPYRROLATE 0.2 MG/ML IJ SOLN: 0.2000 mg | Freq: Once | INTRAMUSCULAR | Status: DC | PRN

## 2015-01-01 MED ORDER — SIMVASTATIN 40 MG PO TABS
40.0000 mg | ORAL_TABLET | Freq: Every day | ORAL | Status: DC
Start: 2015-01-01 — End: 2015-01-02
  Administered 2015-01-01: 40 mg via ORAL

## 2015-01-01 SURGICAL SUPPLY — 65 items
APPLIER CLIP 9.375 MED OPEN (MISCELLANEOUS) ×3
BAG DECANTER FOR FLEXI CONT (MISCELLANEOUS) ×3 IMPLANT
BINDER ABDOMINAL 10 UNV 27-48 (MISCELLANEOUS) ×3 IMPLANT
BINDER ABDOMINAL 12 ML 46-62 (SOFTGOODS) IMPLANT
BLADE CLIPPER SURG (BLADE) ×3 IMPLANT
BLADE SURG 10 STRL SS (BLADE) ×6 IMPLANT
BLADE SURG 15 STRL LF DISP TIS (BLADE) IMPLANT
BLADE SURG 15 STRL SS (BLADE)
CANISTER SUCTION 2500CC (MISCELLANEOUS) ×3 IMPLANT
CHLORAPREP W/TINT 26ML (MISCELLANEOUS) ×3 IMPLANT
CLIP APPLIE 9.375 MED OPEN (MISCELLANEOUS) ×1 IMPLANT
CORDS BIPOLAR (ELECTRODE) IMPLANT
COVER BACK TABLE 60X90IN (DRAPES) ×3 IMPLANT
COVER MAYO STAND STRL (DRAPES) ×3 IMPLANT
DRAIN CHANNEL 15F RND FF W/TCR (WOUND CARE) ×6 IMPLANT
DRAIN CHANNEL 19F RND (DRAIN) IMPLANT
DRAPE LAPAROSCOPIC ABDOMINAL (DRAPES) IMPLANT
DRAPE UNIVERSAL PACK (DRAPES) ×3 IMPLANT
DRSG PAD ABDOMINAL 8X10 ST (GAUZE/BANDAGES/DRESSINGS) ×9 IMPLANT
ELECT BLADE 4.0 EZ CLEAN MEGAD (MISCELLANEOUS) ×3
ELECT COATED BLADE 2.86 ST (ELECTRODE) ×3 IMPLANT
ELECT REM PT RETURN 9FT ADLT (ELECTROSURGICAL) ×3
ELECTRODE BLDE 4.0 EZ CLN MEGD (MISCELLANEOUS) ×1 IMPLANT
ELECTRODE REM PT RTRN 9FT ADLT (ELECTROSURGICAL) ×1 IMPLANT
EVACUATOR SILICONE 100CC (DRAIN) ×6 IMPLANT
GAUZE SPONGE 4X4 12PLY STRL (GAUZE/BANDAGES/DRESSINGS) IMPLANT
GLOVE BIO SURGEON STRL SZ 6 (GLOVE) ×6 IMPLANT
GLOVE BIOGEL PI IND STRL 7.0 (GLOVE) ×1 IMPLANT
GLOVE BIOGEL PI INDICATOR 7.0 (GLOVE) ×2
GLOVE ECLIPSE 6.5 STRL STRAW (GLOVE) ×3 IMPLANT
GLOVE EXAM NITRILE EXT CUFF MD (GLOVE) ×3 IMPLANT
GOWN STRL REUS W/ TWL LRG LVL3 (GOWN DISPOSABLE) ×3 IMPLANT
GOWN STRL REUS W/TWL LRG LVL3 (GOWN DISPOSABLE) ×6
IV SODIUM CHL 0.9% 250ML (IV SOLUTION) ×3 IMPLANT
LIQUID BAND (GAUZE/BANDAGES/DRESSINGS) ×6 IMPLANT
NEEDLE HYPO 25X1 1.5 SAFETY (NEEDLE) ×3 IMPLANT
NS IRRIG 1000ML POUR BTL (IV SOLUTION) ×3 IMPLANT
PACK BASIN DAY SURGERY FS (CUSTOM PROCEDURE TRAY) ×3 IMPLANT
PENCIL BUTTON HOLSTER BLD 10FT (ELECTRODE) ×3 IMPLANT
SPONGE LAP 18X18 X RAY DECT (DISPOSABLE) ×9 IMPLANT
STAPLER VISISTAT 35W (STAPLE) ×6 IMPLANT
SUT ETHILON 2 0 FS 18 (SUTURE) ×6 IMPLANT
SUT MNCRL AB 4-0 PS2 18 (SUTURE) ×9 IMPLANT
SUT MON AB 5-0 PS2 18 (SUTURE) IMPLANT
SUT PDS 3-0 CT2 (SUTURE)
SUT PDS AB 0 CT 36 (SUTURE) ×9 IMPLANT
SUT PDS AB 2-0 CT2 27 (SUTURE) IMPLANT
SUT PDS II 3-0 CT2 27 ABS (SUTURE) IMPLANT
SUT PLAIN 5 0 P 3 18 (SUTURE) IMPLANT
SUT QUILL PDO 2-0 (SUTURE) IMPLANT
SUT VIC AB 3-0 PS1 18 (SUTURE) ×2
SUT VIC AB 3-0 PS1 18XBRD (SUTURE) ×1 IMPLANT
SUT VIC AB 3-0 SH 27 (SUTURE)
SUT VIC AB 3-0 SH 27X BRD (SUTURE) IMPLANT
SUT VICRYL 4-0 PS2 18IN ABS (SUTURE) ×3 IMPLANT
SYR 20CC LL (SYRINGE) ×3 IMPLANT
SYR BULB IRRIGATION 50ML (SYRINGE) ×3 IMPLANT
SYR CONTROL 10ML LL (SYRINGE) ×3 IMPLANT
TOWEL OR 17X24 6PK STRL BLUE (TOWEL DISPOSABLE) ×6 IMPLANT
TRAY FOLEY CATH SILVER 16FR (SET/KITS/TRAYS/PACK) IMPLANT
TUBE CONNECTING 20'X1/4 (TUBING) ×1
TUBE CONNECTING 20X1/4 (TUBING) ×2 IMPLANT
VAC PENCILS W/TUBING CLEAR (MISCELLANEOUS) IMPLANT
VLOCL0436 ×6 IMPLANT
YANKAUER SUCT BULB TIP NO VENT (SUCTIONS) ×3 IMPLANT

## 2015-01-01 NOTE — H&P (Signed)
   Date of Service: 12/16/2014 Create Date: December 16, 2014 8:37  Progress Notes by Glenna Fellows, MD at 12/16/2014 8:37 AM    Author: Glenna Fellows, MD Service: (none) Author Type: Physician   Filed: 12/23/2014 3:41 PM Note Time: 12/16/2014 8:37 AM Note Type: Progress Notes   Status: Addendum Editor: Glenna Fellows, MD (Physician)   Expand All Collapse All   Subjective:    Patient ID: ANAROSA KUBISIAK is a 69 y.o. female.  Follow-up  Here for follow up discussion prior to panniculectomy. Patient is s/p gastric bypass. Highest weight 297 lb, at goal weight. Reports symptoms itching, frequent yeast infections below panniculus. Has never been prescribed antibiotics for this but uses diaper rash cream and powder daily.She has associated back and knee pain, though both improved with wt loss. Continues on oral medication for DM, off insulin. Seen by cardiology few weeks ago, no new rec, EKG with atrial flutter.  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 Lives with brother.   Review of Systems     Objective:   Physical Exam  Cardiovascular: Normal heart sounds.  Bradycardic today  Pulmonary/Chest: Effort normal.  Abdominal: Soft.  Panniculus that extends below mons, no active rashes,protuberant upper abdomen, no hernias  Musculoskeletal:  BLE with visible varicosities without ulcer, redundant skin extending to above knee   Right hip with redundant fat pad/roll     Assessment:     History of massive weight loss, History morbid obesity Panniculitis  Chronic anticoagulation.    Plan:     Plan panniculectomy for recurrent rashes and itching following massive weight loss. Reviewed high risk wound healing complications with this type of procedure and may required prolonged wound care. Reviewed panniculectomy and plan resection umbilicus as lies below fold and she is ok with this.   She will need to be off  Coumadin, bridged with Lovenox during surgery- Dr. Nehemiah Settle manages this and she has visit next week with him. Reviewed overnight stay hospital, drains, abdominal binder, post procedure visits and limitations. Reviewed the fullness of upper abdomen will remain- discussed can try to thin out with direct excision but will not be plicating abdominal muscles, skin quality not appropriate for liposuction. Counseled these latter maneuvers are part of cosmetic abdominoplasty, and not panniculectomy.  25 minutes spent with patient, over half in counseling.       Electronically signed by: Glenna Fellows, MD 12/16/14 1122   ADDENDUM:  Asked for recommendations from Dr. Nehemiah Settle for anticoagulation. Patient reports reason for anticoagulation chronic is more than one DVT in her life. Per [facility] guidelines, this places her at high risk thromboembolic event and she should be bridged for surgery. Recommend holding Coumadin 5 days prior with Lovenox therapeutic dose either bid or daily, last Lovenox dose am 12/31/14 for surgery 01/01/15. Will be able to resume Coumadin day after procedure but will need enough Lovenox until she is therapeutic . She will need her laboratory arranged with INR checked as outpatient the week following surgery.  Electronically signed by: Glenna Fellows, MD 12/23/14 1541     Glenna Fellows, MD MBA Plastic & Reconstructive Surgery [phone]

## 2015-01-01 NOTE — Anesthesia Procedure Notes (Signed)
Procedure Name: Intubation Date/Time: 01/01/2015 7:37 AM Performed by: Zenia Resides D Pre-anesthesia Checklist: Patient identified, Emergency Drugs available, Suction available and Patient being monitored Patient Re-evaluated:Patient Re-evaluated prior to inductionOxygen Delivery Method: Circle System Utilized Preoxygenation: Pre-oxygenation with 100% oxygen Intubation Type: IV induction Ventilation: Mask ventilation without difficulty Laryngoscope Size: Mac and 3 Grade View: Grade I Tube type: Oral Tube size: 7.0 mm Number of attempts: 1 Airway Equipment and Method: Stylet and Oral airway Placement Confirmation: ETT inserted through vocal cords under direct vision,  positive ETCO2 and breath sounds checked- equal and bilateral Secured at: 22 cm Tube secured with: Tape Dental Injury: Teeth and Oropharynx as per pre-operative assessment

## 2015-01-01 NOTE — Op Note (Signed)
Operative Note   DATE OF OPERATION: 10.7.2016  LOCATION: [facility]- observation  SURGICAL DIVISION: Plastic Surgery  PREOPERATIVE DIAGNOSES:  Panniculitis   POSTOPERATIVE DIAGNOSES:  same  PROCEDURE:  Panniculectomy  SURGEON: Glenna Fellows MD MBA  ASSISTANT: none  ANESTHESIA:  General.   EBL: 75 ml  COMPLICATIONS: None immediate.   INDICATIONS FOR PROCEDURE:  The patient, [name] [name], is a 69 y.o. female born on Mar 08, 1946, is here for panniculectomy following massive weight loss with associated rashes and recurrent infection. Plan resection of umbilicus.   FINDINGS: 1964 g soft tissue resection abdomen.  DESCRIPTION OF PROCEDURE:  The patient's operative site was marked with the patient in the preoperative area. Additional panniculus over right upper hip marked, separate from abdominal panniculus. Umbilicus included in planned resection soft tissue. The patient was taken to the operating room. SCDs were placed and IV antibiotics were given. The patient's operative site was prepped and draped in a sterile fashion. A time out was performed and all information was confirmed to be correct. Low transverse incision made inferior to panniculus. Incision carried through superficial fascia down to abdominal wall. Sub- scarpas dissection completed toward costal margin. Care taken to preserve layer of fatty tissue over underlying abdominal wall fascia. Umbilicus included in resection and umbilical stalk over sewn at abdominal wall with interrupted PDS suture.  Superior extent for resection marked by palpation through elevated skin-subcutaneous tissue. Panniculus resected. Wounds irrigated and hemostasis obtained. 15 Fr drain placed percutaneously bilaterally and secured with 2-0 nylon. Low transverse incision closed in layers with 0-PDS in Scarpas fascia. V-lock 0 suture used to approximate dermis. 4-0 monocryl subcuticular used for skin closure. I then addressed the right hip  panniculus and transverse incision made, again superior extent resection marked by palpation through elevated flap to allow for tension free closure. Final scar approximately 6 cm inferior to abdominal incision. Layered closure completed with 3-0 vicryl in superficial fascia, 4-0 vicryl in dermis and 4-0 monocryl subcuticular skin closure. Tissue adhesive applied to all incisions, followed by dry dressing, abdominal binder.   The patient was allowed to wake from anesthesia, extubated and taken to the recovery room in satisfactory condition.   SPECIMENS: none  DRAINS: 15 Fr JP in subcutaneous right and left abdomein  Glenna Fellows, MD [facility] Plastic & Reconstructive Surgery [phone]

## 2015-01-01 NOTE — Anesthesia Postprocedure Evaluation (Signed)
  Anesthesia Post-op Note  Patient: [name] [name]  Procedure(s) Performed: Procedure(s) (LRB): PANNICULECTOMY (N/A)  Patient Location: PACU  Anesthesia Type: General  Level of Consciousness: awake and alert   Airway and Oxygen Therapy: Patient Spontanous Breathing  Post-op Pain: mild  Post-op Assessment: Post-op Vital signs reviewed, Patient's Cardiovascular Status Stable, Respiratory Function Stable, Patent Airway and No signs of Nausea or vomiting  Last Vitals:  Filed Vitals:   01/01/15 1130  BP: 107/60  Pulse: 65  Temp:   Resp: 10    Post-op Vital Signs: stable   Complications: No apparent anesthesia complications

## 2015-01-01 NOTE — Transfer of Care (Signed)
Immediate Anesthesia Transfer of Care Note  Patient: [name] [name]  Procedure(s) Performed: Procedure(s): PANNICULECTOMY (N/A)  Patient Location: PACU  Anesthesia Type:General  Level of Consciousness: awake and alert   Airway & Oxygen Therapy: Patient Spontanous Breathing and Patient connected to face mask oxygen  Post-op Assessment: Report given to RN and Post -op Vital signs reviewed and stable  Post vital signs: Reviewed and stable  Last Vitals:  Filed Vitals:   01/01/15 0653  BP: 122/58  Pulse: 45  Temp: 36.8 C  Resp: 18    Complications: No apparent anesthesia complications

## 2015-01-02 DIAGNOSIS — E65 Localized adiposity: Secondary | ICD-10-CM | POA: Diagnosis not present

## 2015-01-02 NOTE — Discharge Instructions (Signed)
Information for Discharge Teaching: °EXPAREL (bupivacaine liposome injectable suspension)  ° °Your surgeon gave you EXPAREL(bupivacaine) in your surgical incision to help control your pain after surgery.  °· EXPAREL is a local anesthetic that provides pain relief by numbing the tissue around the surgical site. °· EXPAREL is designed to release pain medication over time and can control pain for up to 72 hours. °· Depending on how you respond to EXPAREL, you may require less pain medication during your recovery. ° °Possible side effects: °· Temporary loss of sensation or ability to move in the area where bupivacaine was injected. °· Nausea, vomiting, constipation °· Rarely, numbness and tingling in your mouth or lips, lightheadedness, or anxiety may occur. °· Call your doctor right away if you think you may be experiencing any of these sensations, or if you have other questions regarding possible side effects. ° °Follow all other discharge instructions given to you by your surgeon or nurse. Eat a healthy diet and drink plenty of water or other fluids. ° °If you return to the hospital for any reason within 96 hours following the administration of EXPAREL, please inform your health care providers. ° °About my Jackson-Pratt Bulb Drain ° °What is a Jackson-Pratt bulb? °A Jackson-Pratt is a soft, round device used to collect drainage. It is connected to a Maurin, thin drainage catheter, which is held in place by one or two small stiches near your surgical incision site. When the bulb is squeezed, it forms a vacuum, forcing the drainage to empty into the bulb. ° °Emptying the Jackson-Pratt bulb- °To empty the bulb: °1. Release the plug on the top of the bulb. °2. Pour the bulb's contents into a measuring container which your nurse will provide. °3. Record the time emptied and amount of drainage. Empty the drain(s) as often as your     doctor or nurse recommends. ° °Date                  Time                    Amount (Drain  1)                 Amount (Drain 2) ° °_____________________________________________________________________ ° °_____________________________________________________________________ ° °_____________________________________________________________________ ° °_____________________________________________________________________ ° °_____________________________________________________________________ ° °_____________________________________________________________________ ° °_____________________________________________________________________ ° °_____________________________________________________________________ ° °Squeezing the Jackson-Pratt Bulb- °To squeeze the bulb: °1. Make sure the plug at the top of the bulb is open. °2. Squeeze the bulb tightly in your fist. You will hear air squeezing from the bulb. °3. Replace the plug while the bulb is squeezed. °4. Use a safety pin to attach the bulb to your clothing. This will keep the catheter from     pulling at the bulb insertion site. ° °When to call your doctor- °Call your doctor if: °· Drain site becomes red, swollen or hot. °· You have a fever greater than 101 degrees F. °· There is oozing at the drain site. °· Drain falls out (apply a guaze bandage over the drain hole and secure it with tape). °· Drainage increases daily not related to activity patterns. (You will usually have more drainage when you are active than when you are resting.) °· Drainage has a bad odor. ° ° °

## 2015-01-04 ENCOUNTER — Encounter (HOSPITAL_BASED_OUTPATIENT_CLINIC_OR_DEPARTMENT_OTHER): Payer: Self-pay | Admitting: Plastic Surgery

## 2015-01-26 DEATH — deceased

## 2015-02-26 ENCOUNTER — Ambulatory Visit (HOSPITAL_BASED_OUTPATIENT_CLINIC_OR_DEPARTMENT_OTHER)
Admission: RE | Admit: 2015-02-26 | Payer: Federal, State, Local not specified - PPO | Source: Ambulatory Visit | Admitting: Plastic Surgery

## 2015-02-26 ENCOUNTER — Encounter (HOSPITAL_BASED_OUTPATIENT_CLINIC_OR_DEPARTMENT_OTHER): Admission: RE | Payer: Self-pay | Source: Ambulatory Visit

## 2015-02-26 SURGERY — PANNICULECTOMY
Anesthesia: General
